# Patient Record
Sex: Female | Born: 1965 | Race: Black or African American | Hispanic: No | Marital: Single | Smoking: Never smoker
Health system: Southern US, Community
[De-identification: ages and names within clinical notes are randomized; demographics above are authoritative.]

## PROBLEM LIST (undated history)

## (undated) DIAGNOSIS — I2699 Other pulmonary embolism without acute cor pulmonale: Secondary | ICD-10-CM

## (undated) DIAGNOSIS — I10 Essential (primary) hypertension: Secondary | ICD-10-CM

## (undated) DIAGNOSIS — E119 Type 2 diabetes mellitus without complications: Secondary | ICD-10-CM

## (undated) DIAGNOSIS — J45909 Unspecified asthma, uncomplicated: Secondary | ICD-10-CM

## (undated) DIAGNOSIS — G4730 Sleep apnea, unspecified: Secondary | ICD-10-CM

## (undated) DIAGNOSIS — K219 Gastro-esophageal reflux disease without esophagitis: Secondary | ICD-10-CM

## (undated) HISTORY — PX: OTHER SURGICAL HISTORY: SHX169

## (undated) HISTORY — PX: TUBAL LIGATION: SHX77

## (undated) HISTORY — PX: LITHOTRIPSY: SUR834

## (undated) HISTORY — PX: ANKLE SURGERY: SHX546

---

## 1997-09-25 ENCOUNTER — Emergency Department (HOSPITAL_COMMUNITY): Admission: EM | Admit: 1997-09-25 | Discharge: 1997-09-25 | Payer: Self-pay | Admitting: Emergency Medicine

## 1998-01-20 ENCOUNTER — Emergency Department (HOSPITAL_COMMUNITY): Admission: EM | Admit: 1998-01-20 | Discharge: 1998-01-20 | Payer: Self-pay | Admitting: Emergency Medicine

## 1998-01-21 ENCOUNTER — Encounter: Payer: Self-pay | Admitting: Emergency Medicine

## 1998-01-21 ENCOUNTER — Emergency Department (HOSPITAL_COMMUNITY): Admission: EM | Admit: 1998-01-21 | Discharge: 1998-01-21 | Payer: Self-pay | Admitting: Emergency Medicine

## 1998-01-25 ENCOUNTER — Encounter: Payer: Self-pay | Admitting: Emergency Medicine

## 1998-01-25 ENCOUNTER — Emergency Department (HOSPITAL_COMMUNITY): Admission: EM | Admit: 1998-01-25 | Discharge: 1998-01-25 | Payer: Self-pay | Admitting: Emergency Medicine

## 1998-04-05 ENCOUNTER — Emergency Department (HOSPITAL_COMMUNITY): Admission: EM | Admit: 1998-04-05 | Discharge: 1998-04-05 | Payer: Self-pay | Admitting: Emergency Medicine

## 1998-05-10 ENCOUNTER — Emergency Department (HOSPITAL_COMMUNITY): Admission: EM | Admit: 1998-05-10 | Discharge: 1998-05-10 | Payer: Self-pay | Admitting: Emergency Medicine

## 1998-06-05 ENCOUNTER — Emergency Department (HOSPITAL_COMMUNITY): Admission: EM | Admit: 1998-06-05 | Discharge: 1998-06-05 | Payer: Self-pay | Admitting: Emergency Medicine

## 1998-06-05 ENCOUNTER — Encounter: Payer: Self-pay | Admitting: Emergency Medicine

## 1998-07-02 ENCOUNTER — Ambulatory Visit (HOSPITAL_COMMUNITY): Admission: RE | Admit: 1998-07-02 | Discharge: 1998-07-02 | Payer: Self-pay | Admitting: Family Medicine

## 1998-07-02 ENCOUNTER — Encounter: Payer: Self-pay | Admitting: Family Medicine

## 1998-08-03 ENCOUNTER — Encounter: Payer: Self-pay | Admitting: Emergency Medicine

## 1998-08-03 ENCOUNTER — Emergency Department (HOSPITAL_COMMUNITY): Admission: EM | Admit: 1998-08-03 | Discharge: 1998-08-03 | Payer: Self-pay | Admitting: Emergency Medicine

## 1998-08-07 ENCOUNTER — Encounter: Payer: Self-pay | Admitting: Emergency Medicine

## 1998-08-07 ENCOUNTER — Ambulatory Visit (HOSPITAL_COMMUNITY): Admission: RE | Admit: 1998-08-07 | Discharge: 1998-08-07 | Payer: Self-pay | Admitting: Emergency Medicine

## 1999-04-29 ENCOUNTER — Emergency Department (HOSPITAL_COMMUNITY): Admission: EM | Admit: 1999-04-29 | Discharge: 1999-04-29 | Payer: Self-pay | Admitting: *Deleted

## 1999-04-30 ENCOUNTER — Encounter: Payer: Self-pay | Admitting: *Deleted

## 2000-05-11 ENCOUNTER — Ambulatory Visit (HOSPITAL_COMMUNITY): Admission: RE | Admit: 2000-05-11 | Discharge: 2000-05-11 | Payer: Self-pay | Admitting: Family Medicine

## 2000-05-11 ENCOUNTER — Encounter: Payer: Self-pay | Admitting: Family Medicine

## 2000-06-11 ENCOUNTER — Other Ambulatory Visit: Admission: RE | Admit: 2000-06-11 | Discharge: 2000-06-11 | Payer: Self-pay | Admitting: Family Medicine

## 2000-06-17 ENCOUNTER — Ambulatory Visit (HOSPITAL_COMMUNITY): Admission: RE | Admit: 2000-06-17 | Discharge: 2000-06-17 | Payer: Self-pay | Admitting: Family Medicine

## 2000-08-18 ENCOUNTER — Emergency Department (HOSPITAL_COMMUNITY): Admission: EM | Admit: 2000-08-18 | Discharge: 2000-08-19 | Payer: Self-pay | Admitting: Emergency Medicine

## 2000-08-19 ENCOUNTER — Encounter: Payer: Self-pay | Admitting: Internal Medicine

## 2000-12-18 ENCOUNTER — Emergency Department (HOSPITAL_COMMUNITY): Admission: EM | Admit: 2000-12-18 | Discharge: 2000-12-18 | Payer: Self-pay | Admitting: Emergency Medicine

## 2000-12-22 ENCOUNTER — Ambulatory Visit (HOSPITAL_COMMUNITY): Admission: RE | Admit: 2000-12-22 | Discharge: 2000-12-22 | Payer: Self-pay | Admitting: Family Medicine

## 2000-12-22 ENCOUNTER — Encounter: Payer: Self-pay | Admitting: Family Medicine

## 2001-01-10 ENCOUNTER — Other Ambulatory Visit: Admission: RE | Admit: 2001-01-10 | Discharge: 2001-01-10 | Payer: Self-pay | Admitting: Obstetrics and Gynecology

## 2001-02-09 ENCOUNTER — Encounter (INDEPENDENT_AMBULATORY_CARE_PROVIDER_SITE_OTHER): Payer: Self-pay | Admitting: Specialist

## 2001-02-09 ENCOUNTER — Ambulatory Visit (HOSPITAL_COMMUNITY): Admission: RE | Admit: 2001-02-09 | Discharge: 2001-02-09 | Payer: Self-pay | Admitting: Obstetrics and Gynecology

## 2001-10-12 ENCOUNTER — Emergency Department (HOSPITAL_COMMUNITY): Admission: EM | Admit: 2001-10-12 | Discharge: 2001-10-12 | Payer: Self-pay | Admitting: Emergency Medicine

## 2002-12-18 ENCOUNTER — Other Ambulatory Visit: Admission: RE | Admit: 2002-12-18 | Discharge: 2002-12-18 | Payer: Self-pay | Admitting: Obstetrics and Gynecology

## 2002-12-26 ENCOUNTER — Encounter (INDEPENDENT_AMBULATORY_CARE_PROVIDER_SITE_OTHER): Payer: Self-pay | Admitting: *Deleted

## 2002-12-26 ENCOUNTER — Ambulatory Visit (HOSPITAL_COMMUNITY): Admission: RE | Admit: 2002-12-26 | Discharge: 2002-12-26 | Payer: Self-pay | Admitting: Obstetrics and Gynecology

## 2003-03-19 ENCOUNTER — Emergency Department (HOSPITAL_COMMUNITY): Admission: EM | Admit: 2003-03-19 | Discharge: 2003-03-19 | Payer: Self-pay | Admitting: Emergency Medicine

## 2004-07-09 ENCOUNTER — Encounter: Admission: RE | Admit: 2004-07-09 | Discharge: 2004-07-09 | Payer: Self-pay | Admitting: Family Medicine

## 2004-11-03 ENCOUNTER — Emergency Department (HOSPITAL_COMMUNITY): Admission: EM | Admit: 2004-11-03 | Discharge: 2004-11-03 | Payer: Self-pay | Admitting: Emergency Medicine

## 2006-03-17 ENCOUNTER — Ambulatory Visit (HOSPITAL_COMMUNITY): Admission: RE | Admit: 2006-03-17 | Discharge: 2006-03-17 | Payer: Self-pay | Admitting: Family Medicine

## 2006-07-08 ENCOUNTER — Emergency Department (HOSPITAL_COMMUNITY): Admission: EM | Admit: 2006-07-08 | Discharge: 2006-07-08 | Payer: Self-pay | Admitting: Family Medicine

## 2006-07-17 ENCOUNTER — Emergency Department (HOSPITAL_COMMUNITY): Admission: EM | Admit: 2006-07-17 | Discharge: 2006-07-17 | Payer: Self-pay | Admitting: Emergency Medicine

## 2007-03-31 ENCOUNTER — Emergency Department (HOSPITAL_COMMUNITY): Admission: EM | Admit: 2007-03-31 | Discharge: 2007-03-31 | Payer: Self-pay | Admitting: Emergency Medicine

## 2007-04-22 ENCOUNTER — Ambulatory Visit (HOSPITAL_COMMUNITY): Admission: RE | Admit: 2007-04-22 | Discharge: 2007-04-22 | Payer: Self-pay | Admitting: Family Medicine

## 2007-12-29 ENCOUNTER — Other Ambulatory Visit: Admission: RE | Admit: 2007-12-29 | Discharge: 2007-12-29 | Payer: Self-pay | Admitting: Family Medicine

## 2008-02-17 ENCOUNTER — Encounter: Admission: RE | Admit: 2008-02-17 | Discharge: 2008-02-17 | Payer: Self-pay | Admitting: Family Medicine

## 2008-02-28 ENCOUNTER — Encounter: Admission: RE | Admit: 2008-02-28 | Discharge: 2008-02-28 | Payer: Self-pay | Admitting: Family Medicine

## 2008-04-08 ENCOUNTER — Emergency Department (HOSPITAL_COMMUNITY): Admission: EM | Admit: 2008-04-08 | Discharge: 2008-04-08 | Payer: Self-pay | Admitting: Emergency Medicine

## 2008-07-12 ENCOUNTER — Ambulatory Visit (HOSPITAL_COMMUNITY): Admission: RE | Admit: 2008-07-12 | Discharge: 2008-07-12 | Payer: Self-pay | Admitting: Family Medicine

## 2009-05-13 ENCOUNTER — Encounter: Admission: RE | Admit: 2009-05-13 | Discharge: 2009-05-13 | Payer: Self-pay | Admitting: Family Medicine

## 2009-09-17 ENCOUNTER — Encounter: Admission: RE | Admit: 2009-09-17 | Discharge: 2009-09-17 | Payer: Self-pay | Admitting: Family Medicine

## 2010-02-07 ENCOUNTER — Encounter: Admission: RE | Admit: 2010-02-07 | Discharge: 2010-02-07 | Payer: Self-pay | Admitting: Family Medicine

## 2010-06-22 ENCOUNTER — Encounter: Payer: Self-pay | Admitting: Family Medicine

## 2010-10-17 NOTE — H&P (Signed)
[facility]  Patient:    Patricia Ramsey, Patricia Ramsey Visit Number: [id number] MRN: [id number]          Service Type: DSU Location: [facility] Attending Physician:  Wandalee Ferdinand Dictated by:   Rudy Jew Ashley Royalty, M.D. Admit Date:  02/09/2001                           History and Physical  HISTORY OF PRESENT ILLNESS:   This is a 45 year old gravida 5, para 2-0-3-2 who presented to me January 14, 2001 for second opinion regarding a cyst on her left ovary.  She went to a physician at [facility] complaining of pain and ultimately had an ultrasound in mid July which revealed an apparent complex cyst of the left ovary.  A note alluding to the problem was brought by the patient; however, no dimensions were given on the report.  She was subsequently referred to another gynecologist in town, the assessment of which she was questioning and she self-referred to me.  She subsequently had an ultrasound in this office, which revealed a fibroid uterus and a 1.3 cm right ovarian cyst and a 1.1 cm left ovarian cyst.  She complained of pain when the left adnexal region was probed.  Hence, she is for diagnostic/operative laparoscopy.  MEDICATIONS:                  None.  PAST MEDICAL HISTORY:         The patient had bilateral tubal sterilization procedure in 1986.  Kidney stones, left ankle surgery.  ALLERGIES:                    None.  FAMILY HISTORY:               Positive for hypertension.  SOCIAL HISTORY:               The patient denies the use of tobacco or significant alcohol.  REVIEW OF SYSTEMS:            Noncontributory.  PHYSICAL EXAMINATION:  GENERAL:                      A well-developed, well-nourished, pleasant black female in no acute distress.  VITAL SIGNS:                  Afebrile.  Vital signs stable.  SKIN:                         Warm and dry without lesions.  LYMPHATIC:                    There is no supraclavicular, cervical, or inguinal  adenopathy.  HEENT:                        Normocephalic.  NECK:                         Supple without thyromegaly.  CHEST:                        Lungs are clear.  CARDIAC:                      Regular rate and rhythm without murmurs, gallops, or rubs.  BREASTS:  Deferred.  ABDOMEN:                      Soft and nontender without masses or organomegaly.  Bowel sounds were active.  MUSCULOSKELETAL:              Full range of motion without edema, cyanosis, or CVA tenderness.  PELVIC:                       Deferred until examination under anesthesia.  IMPRESSION:                   1. Right ovarian cyst versus cystic follicle.                               2. Left ovarian cystic follicle.                               3. Fibroid uterus (small).                               4. Left lower quadrant discomfort--etiology                                  uncertain.  Differential includes                                  endometriosis, adhesions.  PLAN:                         Diagnostic/operative laparoscopy.  The risks, benefits, complications, and alternatives fully discussed with the patient. Questions invited and answered. Dictated by:   Rudy Jew Ashley Royalty, M.D. Attending Physician:  Wandalee Ferdinand DD:  02/10/01 TD:  02/10/01 Job: [id number]

## 2010-10-17 NOTE — Op Note (Signed)
NAME:  Patricia Ramsey, Patricia Ramsey                          ACCOUNT NO.:  [id number]   MEDICAL RECORD NO.:  [id number]                   PATIENT TYPE:  AMB   LOCATION:  SDC                                  FACILITY:  [facility]   PHYSICIAN:  James A. Ashley Royalty, M.D.             DATE OF BIRTH:  07/30/1965   DATE OF PROCEDURE:  12/26/2002  DATE OF DISCHARGE:                                 OPERATIVE REPORT   PREOPERATIVE DIAGNOSES:  1. Right adnexal cyst with internal septations.  2. Endometriosis.  3. Pelvic pain - left greater than right, debilitating.   POSTOPERATIVE DIAGNOSES:  1. Right adnexal cyst with internal septations, pathology pending.  2. Adhesions from the rectosigmoid colon to the posterior cul-de-sac.   PROCEDURES:  1. Diagnostic/operative laparotomy.  2. Right oophorectomy.  3. Lysis of adhesions.   SURGEON:  Rudy Jew. Ashley Royalty, M.D.   ANESTHESIA:  General endotracheal anesthesia.   ESTIMATED BLOOD LOSS:  Less than 50 mL.   COMPLICATIONS:  None.   PACKS AND DRAINS:  None.   DESCRIPTION OF PROCEDURE:  The patient was taken to the operating room and  placed in the dorsal supine position.  After adequate general endotracheal  anesthesia was administered, she was placed in the lithotomy position and  prepped and draped in the usual manner for abdominal vaginal surgery.  A  posterior weighted retractor was placed in the vagina.  The anterior lip of  the cervix was grasped with a single-tooth tenaculum.  _______ uterine  manipulator was placed per cervix and held in place with the tenaculum.  The  bladder was drained with a red rubber catheter.  Next, a 1.5 cm  infraumbilical incision was made in the transverse plane.  The patient's old  scar was excised and submitted to pathology due to the patient's history of  endometriosis.  Next, a size 10-11 disposable trocar was placed into the  abdominal cavity without difficulty.  Its location was verified by placement  of the  laparoscope.  There was no evidence of any trauma.  Pneumoperitoneum  was created with CO2 and maintained throughout the procedure.  Next, two 5  mm trocars were placed suprapubically in the left and right lower quadrants,  respectively.  Transillumination and direct visualization techniques were  employed.  The pelvis was then thoroughly inspected.  The uterine corpus was  approximately 6 x 5 x 4 cm and slightly nodular due to fibroids.  The left  fallopian tube was normal save for evidence of previous tubal sterilization  procedure.  The left ovary was normal size, shape and contour without  evidence of any cysts or current endometriosis.  The right fallopian tube  appeared to be surgically absent.  The right ovary was enlarged with an  obvious cyst of perhaps 3 cm in diameter.  The posterior aspect of the ovary  was adherent to the right pelvic sidewall.  The remainder of  the peritoneal  surfaces were smooth and glistening.  There were definite adhesions on the  rectosigmoid colon to the posterior cul-de-sac.  I could appreciate no other  evidence of current endometriosis save for the adherence of the right ovary  to the right pelvic sidewall.   At this point, the ureter was identified and attention turned to removal of  the ovarian cyst.  Before pelvic washings could be obtained, the cyst  ruptured with gentle manipulation and serous fluid ran out.  Due to the fact  that the patient had internal septations, the decision was made to go ahead  and remove the ovary.  Using a tripolar cautery, the infundibulopelvic  ligament was coagulated and divided.  The meso-ovarian was coagulated and  divided, thus releasing the ovary.  Careful attention was paid to the course  of the ureter and to avoid any such injury.  The right ovary was placed in  the anterior cul-de-sac for later retrieval.  The adhesions from the  rectosigmoid colon were sharply and bluntly lysed, thus restoring mobility  to  that area.  Copious irrigation was accomplished. Hemostasis was noted.  The left suprapubic incision was extended to accommodate a size 10 trocar.  The EndoCatch apparatus was introduced into the abdominal cavity and the  specimen placed in the bag.  With only minimal extension of the 10 mm  incision, the right ovary was delivered through the abdominal wall and  submitted to pathology for histologic studies.   At this point, the patient was felt to have benefitted maximally from the  surgical procedure.  The abdominal instruments were removed and the  pneumoperitoneum evacuated.  The vaginal defects were closed with 0 Vicryl  in interrupted fashion with respect to superior and right inferior port.  With respect to the left inferior port, the fascia was closed in a running  fashion.  Hemostasis was noted.  The skin was then closed with 3-0 Monocryl  in a subcuticular fashion.  Vaginal instruments were removed.  Hemostasis  was noted.  Approximately 10 mL of 0.25% bupivacaine were instilled into the  incisions to aid in postoperative analgesia.  The patient tolerated the  procedure extremely well and was returned to the recovery room in good  condition.                                               James A. Ashley Royalty, M.D.    JAM/MEDQ  D:  12/26/2002  T:  12/26/2002  Job:  [id number]

## 2010-10-17 NOTE — Op Note (Signed)
[facility]  Patient:    Patricia Ramsey, Patricia Ramsey Visit Number: [id number] MRN: [id number]          Service Type: Attending:  Rudy Jew. Ashley Royalty, M.D. Dictated by:   Rudy Jew Ashley Royalty, M.D. Proc. Date: 02/09/01                             Operative Report  PREOPERATIVE DIAGNOSES:       1. Fibroid uterus.                               2. Right ovarian cyst versus cystic follicle.                               3. Left ovarian cystic follicle.                               4. Left lower quadrant discomfort - etiology                                  uncertain.  The differential includes                                  endometriosis versus adhesions.  POSTOPERATIVE DIAGNOSES:      1. Right ovarian cyst versus cystic follicle.                               2. Adhesions of the posterior right ovary to the                                  right pelvic side wall.                               3. Pigmented lesion of the anterior cul-de-sac.                                  Pathology pending.  PROCEDURES:                   1. Diagnostic/operative laparoscopy.                               2. Lysis of adhesions.                               3. Biopsy of the anterior cul-de-sac.  SURGEON:                      Rudy Jew. Ashley Royalty, M.D.  ANESTHESIA:                   General.  ESTIMATED BLOOD LOSS:         Less than 25 cc.  COMPLICATIONS:  None.  PACKS AND DRAINS:             None.  DESCRIPTION OF PROCEDURE:     The patient was taken to the operating room and placed in the dorsal supine position.  After adequate general anesthesia was administered, she was placed in the lithotomy position, prepped and draped in the usual manner for abdominal and vaginal surgery.  A posterior weighted retractor was placed per vagina.  The anterior lip of the cervix was grasped with a single-tooth tenaculum.  A Jarcho uterine manipulator was placed per cervix.  The  bladder was drained with a red rubber catheter.  Next, a 2.5 cm infraumbilical incision was made in the transverse plane.  A size 10-11 disposable lower extremity laparoscopic trocar was placed in the abdominal cavity.  Its location was verified by placement of the laparoscope.  There was no evidence of any trauma.  Pneumoperitoneum was created with CO2 and maintained throughout the procedure.  Two additional 5 mm suprapubic ports were made in the left and right lower quadrants respectively using trocars. Direct visualization and transillumination techniques were used.  The pelvis was thoroughly surveyed.  The uterus was normal size, shape and contour without evidence of any subserosal fibroids.  The left and right fallopian tubes were normal size, shape, contour and length with luxuriant fimbria.  The left ovary was essentially normal size, mobile without evidence of any obvious cysts or endometriosis.  The right ovary appeared to contain a small cyst of perhaps 1.5 cm in diameter.  Furthermore, its posterior aspect was adherent to the lateral pelvic side wall.  There was no other evidence of endometriosis on the ovary.  There was a 3-4 mm pigmented lesion in the anterior cul-de-sac consistent with but not diagnostic of endometriosis.  The remainder of the peritoneal surfaces were smooth and glistening without any evidence of endometriosis or adhesions.  An attempt was made to free the right ovary from its adhesive attachment to the right pelvic side wall.  In so doing, the small cyst ruptured. The adhesion to the right pelvic side wall were successfully lysed.  Copious irrigation was accomplished with the Nezhat suction irrigator.  Hemostasis was obtained with the bipolar cautery.  Next, the pigmented lesion of the anterior cul-de-sac was biopsied and the small piece of tissue submitted to pathology for histologic studies. Hemostasis was present.  At this point, the patient was felt  to have benefited maximally from the surgical procedure.  Hence, the abdominal instruments were then removed. Pneumoperitoneum was evacuated.  The fascial defects were closed with 0 Vicryl in an interrupted fashion.  The skin was closed with 3-0 chromic in a subcuticular fashion.  The patient tolerated the procedure extremely well and was returned to the recovery room in good condition. was accomplished with the Dictated by:   Rudy Jew. Ashley Royalty, M.D. Attending:  Rudy Jew Ashley Royalty, M.D. DD:  02/10/01 TD:  02/10/01 Job: [id number]

## 2010-10-17 NOTE — H&P (Signed)
NAME:  Patricia Ramsey, Patricia Ramsey                          ACCOUNT NO.:  [id number]   MEDICAL RECORD NO.:  [id number]                   PATIENT TYPE:  AMB   LOCATION:  [facility]C                                  FACILITY:  [facility]   PHYSICIAN:  James A. Ashley Royalty, M.D.             DATE OF BIRTH:  1965-12-29   DATE OF ADMISSION:  12/26/2002  DATE OF DISCHARGE:                                HISTORY & PHYSICAL   HISTORY OF PRESENT ILLNESS:  This is a 45 year old gravida 5, para 2, 0, 3,  2, who presented to me for her annual examination on December 18, 2002.  At that  time she complained of left lower quadrant discomfort which she describes as  quite debilitating.  She states that it seems somewhat reminiscent of the  pain that she had at the time of her September 2001, laparoscopy at which  point endometriosis was diagnosed.  It radiates slightly to the right as  well.  She denies any associated symptoms.  An ultrasound was performed in  the office on December 21, 2002.  The ultrasound revealed numerous small  fibroids on the uterus.  The overall uterine dimensions were 11 cm x 6 cm x  5 cm.  The left adnexa was unremarkable.  In the right adnexa there was a  2.6 cm complex mass with septations.  The patient was encouraged to wait six  to eight weeks, to see if the adnexal cyst resolved; however, she returned,  stating that she did not feel that she could wait that period of time and  that she was already missing work due to her pelvic symptoms and she has  thus requested surgery on a more emergent basis.   MEDICATIONS:  None.   PAST MEDICAL HISTORY:  Nephrolithiasis in 1997, through 1999.   PAST SURGICAL HISTORY:  1. In 1992, an ectopic pregnancy.  2. In 1986, a tubal sterilization procedure.  3. In 1996, bilateral foot surgery.   ALLERGIES:  No known drug allergies.   FAMILY HISTORY:  Positive for hypertension.   SOCIAL HISTORY:  The patient denies the use of tobacco or significant  alcohol.   REVIEW  OF SYSTEMS:  Noncontributory.   PHYSICAL EXAMINATION:  GENERAL:  A well-nourished, well-developed, pleasant  black female, in no acute distress.  VITAL SIGNS:  Afebrile, vital signs stable.  SKIN:  Warm and dry without lesions.  NODES:  No supraclavicular, cervical, or inguinal adenopathy.  HEENT:  Normocephalic.  NECK:  Supple without thyromegaly.  CHEST:  Lungs are clear.  HEART:  A regular rate and rhythm without murmurs, gallops, or rubs.  RECTAL:  Exam deferred.  ABDOMEN:  Soft, nontender, without masses or organomegaly.  Bowel sounds are  active.  MUSCULOSKELETAL:  Examination reveals a full range of motion without  cyanosis, or edema, no CVA tenderness.  PELVIC:  (On December 18, 2002), the external genitalia were within normal  limits.  Vagina and cervix are without gross lesions.  Uterus is  approximately 10 cm x 5 cm x 4 cm.  No adnexal masses are palpable.   IMPRESSION:  1. History of endometriosis.  2. Status post bilateral tubal sterilization procedure.  3. History of nephrolithiasis.  4. Fibroid uterus.  5. Right adnexal cyst.  6. Left lower quadrant discomfort - etiology uncertain.  Differential     includes endometriosis, ovarian cyst, adhesions, musculoskeletal, primary     gastrointestinal, etc.   PLAN:  Diagnostic/operative laparoscopy.  The risks, benefits,  complications, and alternatives were fully discussed with the patient.  The  possibility of unilateral salpingo-oophorectomy is discussed and accepted.  The possibility of an exploratory laparotomy is discussed and accepted.  Questions are invited and answered.                                                   James A. Ashley Royalty, M.D.    JAM/MEDQ  D:  12/25/2002  T:  12/25/2002  Job:  [id number]

## 2011-03-01 ENCOUNTER — Inpatient Hospital Stay (INDEPENDENT_AMBULATORY_CARE_PROVIDER_SITE_OTHER)
Admission: RE | Admit: 2011-03-01 | Discharge: 2011-03-01 | Disposition: A | Discharge status: Home / Self Care | Payer: Medicaid Other | Source: Ambulatory Visit | Attending: Emergency Medicine | Admitting: Emergency Medicine

## 2011-03-01 DIAGNOSIS — M7710 Lateral epicondylitis, unspecified elbow: Secondary | ICD-10-CM

## 2011-03-11 LAB — I-STAT 8, (EC8 V) (CONVERTED LAB)
Acid-base deficit: 2
BUN: 10
Bicarbonate: 23.2
Chloride: 107
Glucose, Bld: 86
HCT: 42
Hemoglobin: 14.3
Operator id: 275321
Potassium: 4
Sodium: 138
TCO2: 24
pCO2, Ven: 42.1 — ABNORMAL LOW
pH, Ven: 7.349 — ABNORMAL HIGH

## 2011-03-11 LAB — CBC
MCHC: 33.6
MCV: 88.1
Platelets: 281
RDW: 14.2 — ABNORMAL HIGH
WBC: 8.8

## 2011-03-11 LAB — POCT PREGNANCY, URINE
Operator id: 27532
Preg Test, Ur: NEGATIVE

## 2011-03-11 LAB — POCT I-STAT CREATININE
Creatinine, Ser: 0.8
Operator id: 275321

## 2011-03-11 LAB — URINALYSIS, ROUTINE W REFLEX MICROSCOPIC
Hgb urine dipstick: NEGATIVE
Specific Gravity, Urine: 1.03
pH: 6

## 2011-03-11 LAB — DIFFERENTIAL
Eosinophils Relative: 4
Lymphocytes Relative: 37
Monocytes Absolute: 0.6
Monocytes Relative: 7
Neutrophils Relative %: 53

## 2012-06-05 ENCOUNTER — Emergency Department (HOSPITAL_COMMUNITY): Payer: Medicaid Other

## 2012-06-05 ENCOUNTER — Emergency Department (HOSPITAL_COMMUNITY)
Admission: EM | Admit: 2012-06-05 | Discharge: 2012-06-05 | Disposition: A | Discharge status: Home Health | Payer: Medicaid Other | Attending: Emergency Medicine | Admitting: Emergency Medicine

## 2012-06-05 ENCOUNTER — Encounter (HOSPITAL_COMMUNITY): Payer: Self-pay | Admitting: *Deleted

## 2012-06-05 DIAGNOSIS — R05 Cough: Secondary | ICD-10-CM

## 2012-06-05 DIAGNOSIS — E119 Type 2 diabetes mellitus without complications: Secondary | ICD-10-CM | POA: Insufficient documentation

## 2012-06-05 DIAGNOSIS — I10 Essential (primary) hypertension: Secondary | ICD-10-CM | POA: Insufficient documentation

## 2012-06-05 DIAGNOSIS — R059 Cough, unspecified: Secondary | ICD-10-CM | POA: Insufficient documentation

## 2012-06-05 DIAGNOSIS — Z79899 Other long term (current) drug therapy: Secondary | ICD-10-CM | POA: Insufficient documentation

## 2012-06-05 DIAGNOSIS — J45901 Unspecified asthma with (acute) exacerbation: Secondary | ICD-10-CM | POA: Insufficient documentation

## 2012-06-05 HISTORY — DX: Unspecified asthma, uncomplicated: J45.909

## 2012-06-05 HISTORY — DX: Type 2 diabetes mellitus without complications: E11.9

## 2012-06-05 HISTORY — DX: Essential (primary) hypertension: I10

## 2012-06-05 MED ORDER — ALBUTEROL SULFATE (2.5 MG/3ML) 0.083% IN NEBU: 2.5000 mg | INHALATION_SOLUTION | Freq: Four times a day (QID) | RESPIRATORY_TRACT | Status: DC | PRN

## 2012-06-05 MED ORDER — ALBUTEROL SULFATE (5 MG/ML) 0.5% IN NEBU
5.0000 mg | INHALATION_SOLUTION | RESPIRATORY_TRACT | Status: AC
  Administered 2012-06-05 (×3): 5 mg via RESPIRATORY_TRACT
  Filled 2012-06-05 (×3): qty 40

## 2012-06-05 MED ORDER — PREDNISONE 20 MG PO TABS
60.0000 mg | ORAL_TABLET | Freq: Once | ORAL | Status: AC
  Administered 2012-06-05: 60 mg via ORAL
  Filled 2012-06-05: qty 3

## 2012-06-05 MED ORDER — PREDNISONE 20 MG PO TABS: 40.0000 mg | ORAL_TABLET | Freq: Every day | ORAL | Status: DC

## 2012-06-05 MED ORDER — ALBUTEROL SULFATE HFA 108 (90 BASE) MCG/ACT IN AERS: 2.0000 | INHALATION_SPRAY | RESPIRATORY_TRACT | Status: DC | PRN

## 2012-06-05 MED ORDER — IPRATROPIUM BROMIDE 0.02 % IN SOLN
0.5000 mg | RESPIRATORY_TRACT | Status: AC
  Administered 2012-06-05 (×3): 0.5 mg via RESPIRATORY_TRACT
  Filled 2012-06-05 (×3): qty 2.5

## 2012-06-05 NOTE — ED Notes (Signed)
Pt discharged to home with family. NAD.  

## 2012-06-05 NOTE — ED Notes (Signed)
Pt says that she has had a cough and increasing shortness of breath over the past several days

## 2012-06-05 NOTE — ED Provider Notes (Signed)
History     CSN: [id number]  Arrival date & time 06/05/12  1191   First MD Initiated Contact with Patient 06/05/12 0534      Chief Complaint  Patient presents with  . Shortness of Breath    (Consider location/radiation/quality/duration/timing/severity/associated sxs/prior treatment) The history is provided by the patient.  Patricia Ramsey is a 47 y.o. female history of asthma, HTN here with SOB. Shortness or breath and cough over the last 4 days. She has been using her albuterol about twice a day. This evening she was unable to sleep from the shortness of breath. No history of DVT or PE. No leg swelling or recent travel. She rarely has asthma exacerbation and was never admitted.   Past Medical History  Diagnosis Date  . Asthma   . Diabetes   . Hypertension     Past Surgical History  Procedure Date  . Tubal ligation   . Lithotripsy   . Oophrectomy   . Ankle surgery     No family history on file.  History  Substance Use Topics  . Smoking status: Not on file  . Smokeless tobacco: Not on file  . Alcohol Use: No    OB History    Grav Para Term Preterm Abortions TAB SAB Ect Mult Living                  Review of Systems  Respiratory: Positive for shortness of breath.   All other systems reviewed and are negative.    Allergies  Review of patient's allergies indicates no known allergies.  Home Medications   Current Outpatient Rx  Name  Route  Sig  Dispense  Refill  . ALBUTEROL SULFATE HFA 108 (90 BASE) MCG/ACT IN AERS   Inhalation   Inhale 2 puffs into the lungs every 6 (six) hours as needed. For shortness of breath         . ALBUTEROL SULFATE (2.5 MG/3ML) 0.083% IN NEBU   Nebulization   Take 2.5 mg by nebulization every 6 (six) hours as needed. For shortness of breath         . AMLODIPINE-OLMESARTAN 5-40 MG PO TABS   Oral   Take 1 tablet by mouth daily.         Marland Kitchen HYDROCHLOROTHIAZIDE 25 MG PO TABS   Oral   Take 25 mg by mouth daily.           Marland Kitchen METFORMIN HCL 500 MG PO TABS   Oral   Take 500 mg by mouth daily with breakfast.         . ALBUTEROL SULFATE HFA 108 (90 BASE) MCG/ACT IN AERS   Inhalation   Inhale 2 puffs into the lungs every 4 (four) hours as needed for wheezing.   1 Inhaler   0   . PREDNISONE 20 MG PO TABS   Oral   Take 2 tablets (40 mg total) by mouth daily.   10 tablet   0     BP 120/93  Pulse 115  Temp 98.8 F (37.1 C) (Oral)  Resp 22  SpO2 98%  Physical Exam  Nursing note and vitals reviewed. Constitutional: She is oriented to person, place, and time. She appears well-developed and well-nourished.       Coughing, talking in full sentences   HENT:  Head: Normocephalic.  Mouth/Throat: Oropharynx is clear and moist.  Eyes: Conjunctivae normal are normal. Pupils are equal, round, and reactive to light.  Neck: Normal range of motion. Neck  supple.  Cardiovascular: Regular rhythm and normal heart sounds.        Tachycardic   Pulmonary/Chest:       Talking in full sentences. + coughing. + wheezing when coughing. Good air movement. No crackles.   Abdominal: Soft. Bowel sounds are normal. She exhibits no distension. There is no tenderness. There is no rebound.  Musculoskeletal: Normal range of motion. She exhibits no edema and no tenderness.  Neurological: She is alert and oriented to person, place, and time.  Skin: Skin is warm and dry.  Psychiatric: She has a normal mood and affect. Her behavior is normal. Thought content normal.    ED Course  Procedures (including critical care time)   Labs Reviewed  GLUCOSE, CAPILLARY   Dg Chest 2 View  06/05/2012  *RADIOLOGY REPORT*  Clinical Data: Right chest pain, shortness of breath, cough for 3 days, history asthma, bronchitis, diabetes, hypertension, pneumonia  CHEST - 2 VIEW  Comparison: None  Findings: Normal heart size, mediastinal contours, and pulmonary vascularity. Bronchitic changes without infiltrate, pleural effusion, or pneumothorax. Minimal  atelectasis versus scarring right middle lobe. Bones unremarkable.  IMPRESSION: Bronchitic changes with minimal atelectasis versus scarring in right middle lobe.   Original Report Authenticated By: Ulyses Southward, M.D.      1. Asthma exacerbation   2. Cough      Date: 06/05/2012  Rate: 113  Rhythm: sinus tachycardia  QRS Axis: normal  Intervals: normal  ST/T Wave abnormalities: nonspecific ST changes  Conduction Disutrbances:none  Narrative Interpretation:   Old EKG Reviewed: none available    MDM  Patricia Ramsey is a 47 y.o. female hx of asthma here with SOB and cough. Likely has asthma exacerbation from viral infection. Will give prednisone, albuterol/atrovent x 3 and CXR. Will reassess.   7:08 AM Felt better. CXR showed bronchitic changes. Less wheezing and improved air movement on exam. Will d/c home with a course of steroids and albuterol prn.        Richardean Canal, MD 06/05/12 [phone]

## 2012-06-16 ENCOUNTER — Other Ambulatory Visit (HOSPITAL_COMMUNITY)
Admission: RE | Admit: 2012-06-16 | Discharge: 2012-06-16 | Disposition: A | Discharge status: Home / Self Care | Payer: Medicaid Other | Source: Ambulatory Visit | Attending: Family Medicine | Admitting: Family Medicine

## 2012-06-16 ENCOUNTER — Other Ambulatory Visit: Payer: Self-pay | Admitting: Family Medicine

## 2012-06-16 DIAGNOSIS — Z1151 Encounter for screening for human papillomavirus (HPV): Secondary | ICD-10-CM | POA: Insufficient documentation

## 2012-06-16 DIAGNOSIS — Z01419 Encounter for gynecological examination (general) (routine) without abnormal findings: Secondary | ICD-10-CM | POA: Insufficient documentation

## 2012-06-22 ENCOUNTER — Other Ambulatory Visit (HOSPITAL_COMMUNITY): Payer: Self-pay | Admitting: Family Medicine

## 2012-06-22 ENCOUNTER — Other Ambulatory Visit: Payer: Self-pay | Admitting: Family Medicine

## 2012-06-22 DIAGNOSIS — R102 Pelvic and perineal pain: Secondary | ICD-10-CM

## 2012-06-22 DIAGNOSIS — Z1231 Encounter for screening mammogram for malignant neoplasm of breast: Secondary | ICD-10-CM

## 2012-06-27 ENCOUNTER — Ambulatory Visit
Admission: RE | Admit: 2012-06-27 | Discharge: 2012-06-27 | Disposition: A | Discharge status: Home / Self Care | Payer: Medicaid Other | Source: Ambulatory Visit | Attending: Family Medicine | Admitting: Family Medicine

## 2012-06-27 ENCOUNTER — Other Ambulatory Visit: Payer: Medicaid Other

## 2012-06-27 DIAGNOSIS — R102 Pelvic and perineal pain: Secondary | ICD-10-CM

## 2012-07-01 ENCOUNTER — Ambulatory Visit (HOSPITAL_COMMUNITY)
Admission: RE | Admit: 2012-07-01 | Discharge: 2012-07-01 | Disposition: A | Discharge status: Home / Self Care | Payer: Medicaid Other | Source: Ambulatory Visit | Attending: Family Medicine | Admitting: Family Medicine

## 2012-07-01 DIAGNOSIS — Z1231 Encounter for screening mammogram for malignant neoplasm of breast: Secondary | ICD-10-CM | POA: Insufficient documentation

## 2013-03-03 ENCOUNTER — Encounter (HOSPITAL_COMMUNITY): Payer: Self-pay | Admitting: Family Medicine

## 2013-03-03 ENCOUNTER — Emergency Department (HOSPITAL_COMMUNITY): Payer: Medicaid Other

## 2013-03-03 ENCOUNTER — Emergency Department (HOSPITAL_COMMUNITY)
Admission: EM | Admit: 2013-03-03 | Discharge: 2013-03-03 | Disposition: A | Discharge status: Home / Self Care | Payer: Medicaid Other | Attending: Emergency Medicine | Admitting: Emergency Medicine

## 2013-03-03 DIAGNOSIS — Z79899 Other long term (current) drug therapy: Secondary | ICD-10-CM | POA: Insufficient documentation

## 2013-03-03 DIAGNOSIS — I10 Essential (primary) hypertension: Secondary | ICD-10-CM | POA: Insufficient documentation

## 2013-03-03 DIAGNOSIS — Y9229 Other specified public building as the place of occurrence of the external cause: Secondary | ICD-10-CM | POA: Insufficient documentation

## 2013-03-03 DIAGNOSIS — W208XXA Other cause of strike by thrown, projected or falling object, initial encounter: Secondary | ICD-10-CM | POA: Insufficient documentation

## 2013-03-03 DIAGNOSIS — Y939 Activity, unspecified: Secondary | ICD-10-CM | POA: Insufficient documentation

## 2013-03-03 DIAGNOSIS — S6991XA Unspecified injury of right wrist, hand and finger(s), initial encounter: Secondary | ICD-10-CM

## 2013-03-03 DIAGNOSIS — E119 Type 2 diabetes mellitus without complications: Secondary | ICD-10-CM | POA: Insufficient documentation

## 2013-03-03 DIAGNOSIS — J45909 Unspecified asthma, uncomplicated: Secondary | ICD-10-CM | POA: Insufficient documentation

## 2013-03-03 DIAGNOSIS — S6990XA Unspecified injury of unspecified wrist, hand and finger(s), initial encounter: Secondary | ICD-10-CM | POA: Insufficient documentation

## 2013-03-03 NOTE — ED Provider Notes (Signed)
CSN: [id number]     Arrival date & time 03/03/13  1342 History   First MD Initiated Contact with Patient 03/03/13 1349     Chief Complaint  Patient presents with  . Hand Injury   (Consider location/radiation/quality/duration/timing/severity/associated sxs/prior Treatment) Patient is a 47 y.o. female presenting with hand injury. The history is provided by the patient and medical records.  Hand Injury  Patient is to the ED for right hand injury. Patient states he was at [employer] last night and when she tried to get a drink out of the cooler the lack and sodas fell on top of her hand. States now she has pain along the ulnar aspect of her right hand and it feels swollen. Denies any numbness or paresthesias. No prior hand injury. Patient is left-hand dominant. No medications taken for her symptoms PTA.  Past Medical History  Diagnosis Date  . Asthma   . Diabetes   . Hypertension    Past Surgical History  Procedure Laterality Date  . Tubal ligation    . Lithotripsy    . Oophrectomy    . Ankle surgery     History reviewed. No pertinent family history. History  Substance Use Topics  . Smoking status: Never Smoker   . Smokeless tobacco: Not on file  . Alcohol Use: No   OB History   Grav Para Term Preterm Abortions TAB SAB Ect Mult Living                 Review of Systems  Musculoskeletal: Positive for arthralgias.  All other systems reviewed and are negative.    Allergies  Review of patient's allergies indicates no known allergies.  Home Medications   Current Outpatient Rx  Name  Route  Sig  Dispense  Refill  . albuterol (PROVENTIL HFA;VENTOLIN HFA) 108 (90 BASE) MCG/ACT inhaler   Inhalation   Inhale 2 puffs into the lungs every 6 (six) hours as needed. For shortness of breath         . albuterol (PROVENTIL HFA;VENTOLIN HFA) 108 (90 BASE) MCG/ACT inhaler   Inhalation   Inhale 2 puffs into the lungs every 4 (four) hours as needed for wheezing.   1 Inhaler    0   . albuterol (PROVENTIL) (2.5 MG/3ML) 0.083% nebulizer solution   Nebulization   Take 2.5 mg by nebulization every 6 (six) hours as needed. For shortness of breath         . albuterol (PROVENTIL) (2.5 MG/3ML) 0.083% nebulizer solution   Nebulization   Take 3 mLs (2.5 mg total) by nebulization every 6 (six) hours as needed for wheezing.   25 mL   1   . amLODipine-olmesartan (AZOR) 5-40 MG per tablet   Oral   Take 1 tablet by mouth daily.         . hydrochlorothiazide (HYDRODIURIL) 25 MG tablet   Oral   Take 25 mg by mouth daily.         . metFORMIN (GLUCOPHAGE) 500 MG tablet   Oral   Take 500 mg by mouth daily with breakfast.         . predniSONE (DELTASONE) 20 MG tablet   Oral   Take 2 tablets (40 mg total) by mouth daily.   10 tablet   0    BP 138/93  Pulse 84  Temp(Src) 99 F (37.2 C) (Oral)  Resp 16  SpO2 97%  Physical Exam  Nursing note and vitals reviewed. Constitutional: She is oriented to person,  place, and time. She appears well-developed and well-nourished. No distress.  HENT:  Head: Normocephalic and atraumatic.  Mouth/Throat: Oropharynx is clear and moist.  Eyes: Conjunctivae and EOM are normal. Pupils are equal, round, and reactive to light.  Neck: Normal range of motion. Neck supple.  Cardiovascular: Normal rate, regular rhythm and normal heart sounds.   Pulmonary/Chest: Effort normal and breath sounds normal. No respiratory distress. She has no wheezes.  Musculoskeletal: Normal range of motion.       Hands: TTP along right 5th metacarpal; full ROM maintained; normal grip strength; no swelling, bruising, or gross deformity; strong cap refill; sensation intact  Neurological: She is alert and oriented to person, place, and time.  Skin: Skin is warm and dry. She is not diaphoretic.  Psychiatric: She has a normal mood and affect.    ED Course  Procedures (including critical care time) Labs Review Labs Reviewed - No data to  display Imaging Review Dg Hand Complete Right  03/03/2013   CLINICAL DATA:  Fourth and 5th metacarpal pain post right hand injury  EXAM: RIGHT HAND - COMPLETE 3+ VIEW  COMPARISON:  None.  FINDINGS: There is no evidence of fracture or dislocation. There is no evidence of arthropathy or other focal bone abnormality. Soft tissues are unremarkable.  IMPRESSION: Negative.   Electronically Signed   By: Natasha Mead M.D.   On: 03/03/2013 14:53    MDM   1. Hand injury, right, initial encounter    X-ray negative for acute fracture or dislocation. Patient instructed to take over-the-counter anti-inflammatories as needed for pain. She will followup with her primary care physician if problems occur. Discussed plan with patient, she agreed.  Return precautions advised.  Garlon Hatchet, PA-C 03/03/13 1524

## 2013-03-03 NOTE — ED Notes (Signed)
Per pt sts last night she was at [employer] and a rack in the cooler fell on her hand. sts painful and swollen.

## 2013-03-06 NOTE — ED Provider Notes (Signed)
Medical screening examination/treatment/procedure(s) were performed by non-physician practitioner and as supervising physician I was immediately available for consultation/collaboration.    Vida Roller, MD 03/06/13 1501

## 2013-06-05 ENCOUNTER — Other Ambulatory Visit (HOSPITAL_COMMUNITY): Payer: Self-pay | Admitting: Family Medicine

## 2013-06-05 DIAGNOSIS — Z1231 Encounter for screening mammogram for malignant neoplasm of breast: Secondary | ICD-10-CM

## 2013-06-16 ENCOUNTER — Other Ambulatory Visit: Payer: Self-pay | Admitting: Family Medicine

## 2013-06-16 DIAGNOSIS — N83209 Unspecified ovarian cyst, unspecified side: Secondary | ICD-10-CM

## 2013-06-17 ENCOUNTER — Encounter (HOSPITAL_COMMUNITY): Payer: Self-pay | Admitting: Emergency Medicine

## 2013-06-17 ENCOUNTER — Emergency Department (HOSPITAL_COMMUNITY): Payer: Medicaid Other

## 2013-06-17 ENCOUNTER — Emergency Department (HOSPITAL_COMMUNITY)
Admission: EM | Admit: 2013-06-17 | Discharge: 2013-06-18 | Disposition: A | Discharge status: Home / Self Care | Payer: Medicaid Other | Attending: Emergency Medicine | Admitting: Emergency Medicine

## 2013-06-17 DIAGNOSIS — Z9079 Acquired absence of other genital organ(s): Secondary | ICD-10-CM | POA: Insufficient documentation

## 2013-06-17 DIAGNOSIS — Z9889 Other specified postprocedural states: Secondary | ICD-10-CM | POA: Insufficient documentation

## 2013-06-17 DIAGNOSIS — D259 Leiomyoma of uterus, unspecified: Secondary | ICD-10-CM | POA: Insufficient documentation

## 2013-06-17 DIAGNOSIS — Z79899 Other long term (current) drug therapy: Secondary | ICD-10-CM | POA: Insufficient documentation

## 2013-06-17 DIAGNOSIS — I10 Essential (primary) hypertension: Secondary | ICD-10-CM | POA: Insufficient documentation

## 2013-06-17 DIAGNOSIS — Z9851 Tubal ligation status: Secondary | ICD-10-CM | POA: Insufficient documentation

## 2013-06-17 DIAGNOSIS — R109 Unspecified abdominal pain: Secondary | ICD-10-CM

## 2013-06-17 DIAGNOSIS — J45909 Unspecified asthma, uncomplicated: Secondary | ICD-10-CM | POA: Insufficient documentation

## 2013-06-17 DIAGNOSIS — E119 Type 2 diabetes mellitus without complications: Secondary | ICD-10-CM | POA: Insufficient documentation

## 2013-06-17 DIAGNOSIS — R1032 Left lower quadrant pain: Secondary | ICD-10-CM | POA: Insufficient documentation

## 2013-06-17 LAB — BASIC METABOLIC PANEL
BUN: 9 mg/dL (ref 6–23)
CALCIUM: 8.9 mg/dL (ref 8.4–10.5)
CHLORIDE: 102 meq/L (ref 96–112)
CO2: 24 meq/L (ref 19–32)
CREATININE: 0.58 mg/dL (ref 0.50–1.10)
Glucose, Bld: 91 mg/dL (ref 70–99)
POTASSIUM: 4 meq/L (ref 3.7–5.3)
SODIUM: 141 meq/L (ref 137–147)

## 2013-06-17 LAB — CBC
HCT: 41 % (ref 36.0–46.0)
HEMOGLOBIN: 13.8 g/dL (ref 12.0–15.0)
MCH: 29.9 pg (ref 26.0–34.0)
MCHC: 33.7 g/dL (ref 30.0–36.0)
MCV: 88.9 fL (ref 78.0–100.0)
Platelets: 277 10*3/uL (ref 150–400)
RBC: 4.61 MIL/uL (ref 3.87–5.11)
RDW: 14.6 % (ref 11.5–15.5)
WBC: 8.3 10*3/uL (ref 4.0–10.5)

## 2013-06-17 MED ORDER — ALBUTEROL SULFATE (2.5 MG/3ML) 0.083% IN NEBU
5.0000 mg | INHALATION_SOLUTION | Freq: Once | RESPIRATORY_TRACT | Status: AC
  Administered 2013-06-17: 5 mg via RESPIRATORY_TRACT
  Filled 2013-06-17: qty 6

## 2013-06-17 MED ORDER — IPRATROPIUM BROMIDE 0.02 % IN SOLN
0.5000 mg | Freq: Once | RESPIRATORY_TRACT | Status: AC
  Administered 2013-06-17: 0.5 mg via RESPIRATORY_TRACT
  Filled 2013-06-17: qty 2.5

## 2013-06-17 NOTE — ED Notes (Signed)
Patient with audible wheezing and shortness of breath for last two days.  Patient states she has also had increased back pain for the last four days.

## 2013-06-18 ENCOUNTER — Emergency Department (HOSPITAL_COMMUNITY): Payer: Medicaid Other

## 2013-06-18 LAB — URINALYSIS, ROUTINE W REFLEX MICROSCOPIC
BILIRUBIN URINE: NEGATIVE
Glucose, UA: NEGATIVE mg/dL
Hgb urine dipstick: NEGATIVE
KETONES UR: NEGATIVE mg/dL
NITRITE: NEGATIVE
PH: 5.5 (ref 5.0–8.0)
PROTEIN: NEGATIVE mg/dL
Specific Gravity, Urine: 1.026 (ref 1.005–1.030)
UROBILINOGEN UA: 1 mg/dL (ref 0.0–1.0)

## 2013-06-18 LAB — URINE MICROSCOPIC-ADD ON

## 2013-06-18 NOTE — Discharge Instructions (Signed)
Abdominal Pain, Adult °Many things can cause abdominal pain. Usually, abdominal pain is not caused by a disease and will improve without treatment. It can often be observed and treated at home. Your health care provider will do a physical exam and possibly order blood tests and X-rays to help determine the seriousness of your pain. However, in many cases, more time must pass before a clear cause of the pain can be found. Before that point, your health care provider may not know if you need more testing or further treatment. °HOME CARE INSTRUCTIONS  °Monitor your abdominal pain for any changes. The following actions may help to alleviate any discomfort you are experiencing: °· Only take over-the-counter or prescription medicines as directed by your health care provider. °· Do not take laxatives unless directed to do so by your health care provider. °· Try a clear liquid diet (broth, tea, or water) as directed by your health care provider. Slowly move to a bland diet as tolerated. °SEEK MEDICAL CARE IF: °· You have unexplained abdominal pain. °· You have abdominal pain associated with nausea or diarrhea. °· You have pain when you urinate or have a bowel movement. °· You experience abdominal pain that wakes you in the night. °· You have abdominal pain that is worsened or improved by eating food. °· You have abdominal pain that is worsened with eating fatty foods. °SEEK IMMEDIATE MEDICAL CARE IF:  °· Your pain does not go away within 2 hours. °· You have a fever. °· You keep throwing up (vomiting). °· Your pain is felt only in portions of the abdomen, such as the right side or the left lower portion of the abdomen. °· You pass bloody or black tarry stools. °MAKE SURE YOU: °· Understand these instructions.   °· Will watch your condition.   °· Will get help right away if you are not doing well or get worse.   °Document Released: 02/25/2005 Document Revised: 03/08/2013 Document Reviewed: 01/25/2013 °[employer]® Patient  Information ©2014 [employer]. ° °

## 2013-06-18 NOTE — ED Notes (Signed)
Pt states understanding of discharge paperwork

## 2013-06-18 NOTE — ED Provider Notes (Addendum)
CSN: [id number]     Arrival date & time 06/17/13  2213 History   First MD Initiated Contact with Patient 06/17/13 2330     Chief Complaint  Patient presents with  . Shortness of Breath  . Back Pain   (Consider location/radiation/quality/duration/timing/severity/associated sxs/prior Treatment) HPI Patient is an obese NIDDM female who presents with 3 days of lower abdominal pain. The pain is worse on the left than the right. The pain is aching and radiates to the inguinal region on both sides. It waxes and wanes in severity. It is worse with walking. No fever. No GU sx. No N/V/D. The patient has not been sexually active for > 3 years.   Pain is aching and 8/10. She was seen in clinic yesterday and prescribed Tramadol but states, "it doesn't do anything but put me to sleep".   Past Medical History  Diagnosis Date  . Asthma   . Diabetes   . Hypertension    Past Surgical History  Procedure Laterality Date  . Tubal ligation    . Lithotripsy    . Oophrectomy    . Ankle surgery     History reviewed. No pertinent family history. History  Substance Use Topics  . Smoking status: Never Smoker   . Smokeless tobacco: Not on file  . Alcohol Use: No   OB History   Grav Para Term Preterm Abortions TAB SAB Ect Mult Living                 Review of Systems  10 point ROS PERFORMED AND IS NEGATIVE WITH THE EXCEPTION OF SYMPTOMS NOTED ABOVE.    Allergies  Review of patient's allergies indicates no known allergies.  Home Medications   Current Outpatient Rx  Name  Route  Sig  Dispense  Refill  . albuterol (PROVENTIL HFA;VENTOLIN HFA) 108 (90 BASE) MCG/ACT inhaler   Inhalation   Inhale 2 puffs into the lungs every 6 (six) hours as needed. For shortness of breath         . albuterol (PROVENTIL) (2.5 MG/3ML) 0.083% nebulizer solution   Nebulization   Take 3 mLs (2.5 mg total) by nebulization every 6 (six) hours as needed for wheezing.   25 mL   1   . amLODipine-olmesartan (AZOR)  5-40 MG per tablet   Oral   Take 1 tablet by mouth daily.         . hydrochlorothiazide (HYDRODIURIL) 25 MG tablet   Oral   Take 25 mg by mouth daily.         . metFORMIN (GLUCOPHAGE) 500 MG tablet   Oral   Take 500 mg by mouth every evening.          . rosuvastatin (CRESTOR) 5 MG tablet   Oral   Take 5 mg by mouth every morning.         . traMADol (ULTRAM) 50 MG tablet   Oral   Take 50-100 mg by mouth every 6 (six) hours as needed for moderate pain.          BP 127/77  Pulse 87  Temp(Src) 98.2 F (36.8 C) (Oral)  Resp 16  SpO2 97% Physical Exam  Constitutional: She is oriented to person, place, and time. She appears well-developed and well-nourished.  HENT:  Head: Normocephalic and atraumatic.  Nose: Nose normal.  Mouth/Throat: Oropharynx is clear and moist.  Eyes: Conjunctivae are normal. Pupils are equal, round, and reactive to light.  Neck: Normal range of motion. Neck supple.  Cardiovascular: Normal rate, regular rhythm and normal heart sounds.   No murmur heard. Pulmonary/Chest: Effort normal and breath sounds normal. No respiratory distress. She has no wheezes. She has no rales. She exhibits no tenderness.  Abdominal: Soft. She exhibits no distension. There is no rebound.  Musculoskeletal: Normal range of motion. She exhibits no edema.  Neurological: She is alert and oriented to person, place, and time. No cranial nerve deficit.  Skin: Skin is warm and dry.  Psychiatric: She has a normal mood and affect. Her behavior is normal.     ED Course  Procedures (including critical care time) Labs Review Results for orders placed during the hospital encounter of 06/17/13 (from the past 24 hour(s))  CBC     Status: None   Collection Time    06/17/13 10:47 PM      Result Value Range   WBC 8.3  4.0 - 10.5 K/uL   RBC 4.61  3.87 - 5.11 MIL/uL   Hemoglobin 13.8  12.0 - 15.0 g/dL   HCT 41.0  36.0 - 46.0 %   MCV 88.9  78.0 - 100.0 fL   MCH 29.9  26.0 - 34.0  pg   MCHC 33.7  30.0 - 36.0 g/dL   RDW 14.6  11.5 - 15.5 %   Platelets 277  150 - 400 K/uL  BASIC METABOLIC PANEL     Status: None   Collection Time    06/17/13 10:47 PM      Result Value Range   Sodium 141  137 - 147 mEq/L   Potassium 4.0  3.7 - 5.3 mEq/L   Chloride 102  96 - 112 mEq/L   CO2 24  19 - 32 mEq/L   Glucose, Bld 91  70 - 99 mg/dL   BUN 9  6 - 23 mg/dL   Creatinine, Ser 0.58  0.50 - 1.10 mg/dL   Calcium 8.9  8.4 - 10.5 mg/dL   GFR calc non Af Amer >90  >90 mL/min   GFR calc Af Amer >90  >90 mL/min    Imaging Review Dg Chest 2 View  06/17/2013   CLINICAL DATA:  Shortness of breath, abdominal pain  EXAM: CHEST  2 VIEW  COMPARISON:  06/05/2012  FINDINGS: Low lung volumes. No focal consolidation. No pleural effusion or pneumothorax.  The heart is normal in size.  Mild degenerative changes of the visualized thoracolumbar spine.  IMPRESSION: No evidence of acute cardiopulmonary disease.   Electronically Signed   By: Julian Hy M.D.   On: 06/17/2013 23:59    EKG Interpretation   None      US Pelvis Complete (Final result)  Result time: 06/18/13 01:53:46    Final result by Rad Results In Interface (06/18/13 01:53:46)    Narrative:   CLINICAL DATA: Left lower quadrant abdominal pain.  EXAM: TRANSABDOMINAL AND TRANSVAGINAL ULTRASOUND OF PELVIS  DOPPLER ULTRASOUND OF OVARIES  TECHNIQUE: Both transabdominal and transvaginal ultrasound examinations of the pelvis were performed. Transabdominal technique was performed for global imaging of the pelvis including uterus, left ovary, adnexal regions, and pelvic cul-de-sac.  It was necessary to proceed with endovaginal exam following the transabdominal exam to visualize the uterus and left ovary in greater detail. Color and duplex Doppler ultrasound was utilized to evaluate blood flow to the left ovary.  COMPARISON: None.  FINDINGS: Uterus  Measurements: 8.3 x 4.9 x 5.3 cm. There is diffuse heterogeneity  of the myometrial echogenicity, grossly stable from prior studies. A 2.0 cm fibroid  is seen along the posterior myometrium.  Endometrium  Thickness: 0.3 cm, with trace fluid noted in the endometrial canal. No focal abnormality visualized.  Right ovary  Status post right-sided oophorectomy.  Left ovary  Measurements: 2.1 x 1.5 x 1.8 cm. Normal appearance/no adnexal mass, though difficult to fully characterize.  Pulsed Doppler evaluation of the left ovary demonstrates normal low-resistance arterial and venous waveforms, though this is difficult to fully characterize.  Other findings  No free fluid seen within the pelvic cul-de-sac.  IMPRESSION: 1. No evidence for ovarian torsion. Left ovary is unremarkable in appearance, though difficult to fully characterize. 2. Diffuse heterogeneity of myometrial echogenicity is grossly stable from prior studies and nonspecific in appearance; it may be related to the patient's history of pelvic inflammatory disease. Small myometrial fibroid again seen. 3. Trace fluid noted in the endometrial canal.   Electronically Signed By: Garald Balding M.D. On: 06/18/2013 01:53      MDM  Patient is feeling better. U/S notable for small uterine fibroid, no torsion. S/P pelvic exam < 48 hrs ago. Abdominal exam is benign. Stable for d/c with plan for outpatient f/u .     Elyn Peers, MD 06/18/13 2449  Elyn Peers, MD 07/05/13 [phone]

## 2013-06-19 ENCOUNTER — Other Ambulatory Visit: Payer: Medicaid Other

## 2013-07-03 ENCOUNTER — Ambulatory Visit (HOSPITAL_COMMUNITY): Payer: Medicaid Other

## 2013-07-12 ENCOUNTER — Ambulatory Visit (HOSPITAL_COMMUNITY): Admission: RE | Admit: 2013-07-12 | Payer: No Typology Code available for payment source | Source: Ambulatory Visit

## 2013-08-29 ENCOUNTER — Emergency Department (HOSPITAL_COMMUNITY): Payer: Medicaid Other

## 2013-08-29 ENCOUNTER — Encounter (HOSPITAL_COMMUNITY): Payer: Self-pay | Admitting: Emergency Medicine

## 2013-08-29 ENCOUNTER — Emergency Department (HOSPITAL_COMMUNITY)
Admission: EM | Admit: 2013-08-29 | Discharge: 2013-08-30 | Disposition: A | Discharge status: Home / Self Care | Payer: Medicaid Other | Attending: Emergency Medicine | Admitting: Emergency Medicine

## 2013-08-29 DIAGNOSIS — I10 Essential (primary) hypertension: Secondary | ICD-10-CM | POA: Insufficient documentation

## 2013-08-29 DIAGNOSIS — E119 Type 2 diabetes mellitus without complications: Secondary | ICD-10-CM | POA: Insufficient documentation

## 2013-08-29 DIAGNOSIS — J069 Acute upper respiratory infection, unspecified: Secondary | ICD-10-CM | POA: Insufficient documentation

## 2013-08-29 DIAGNOSIS — R1110 Vomiting, unspecified: Secondary | ICD-10-CM | POA: Insufficient documentation

## 2013-08-29 DIAGNOSIS — J45901 Unspecified asthma with (acute) exacerbation: Secondary | ICD-10-CM

## 2013-08-29 DIAGNOSIS — Z79899 Other long term (current) drug therapy: Secondary | ICD-10-CM | POA: Insufficient documentation

## 2013-08-29 DIAGNOSIS — R197 Diarrhea, unspecified: Secondary | ICD-10-CM | POA: Insufficient documentation

## 2013-08-29 LAB — URINALYSIS, ROUTINE W REFLEX MICROSCOPIC
BILIRUBIN URINE: NEGATIVE
Glucose, UA: NEGATIVE mg/dL
HGB URINE DIPSTICK: NEGATIVE
Ketones, ur: NEGATIVE mg/dL
Leukocytes, UA: NEGATIVE
Nitrite: NEGATIVE
PROTEIN: NEGATIVE mg/dL
SPECIFIC GRAVITY, URINE: 1.024 (ref 1.005–1.030)
Urobilinogen, UA: 0.2 mg/dL (ref 0.0–1.0)
pH: 5 (ref 5.0–8.0)

## 2013-08-29 LAB — COMPREHENSIVE METABOLIC PANEL
ALT: 22 U/L (ref 0–35)
AST: 25 U/L (ref 0–37)
Albumin: 3.7 g/dL (ref 3.5–5.2)
Alkaline Phosphatase: 90 U/L (ref 39–117)
BILIRUBIN TOTAL: 0.2 mg/dL — AB (ref 0.3–1.2)
BUN: 8 mg/dL (ref 6–23)
CO2: 28 meq/L (ref 19–32)
Calcium: 9.7 mg/dL (ref 8.4–10.5)
Chloride: 99 mEq/L (ref 96–112)
Creatinine, Ser: 0.85 mg/dL (ref 0.50–1.10)
GFR calc Af Amer: >90 mL/min (ref >90)
GFR calc non Af Amer: 79 mL/min — ABNORMAL LOW (ref >90)
Glucose, Bld: 74 mg/dL (ref 70–99)
Potassium: 3.9 mEq/L (ref 3.7–5.3)
SODIUM: 141 meq/L (ref 137–147)
Total Protein: 8 g/dL (ref 6.0–8.3)

## 2013-08-29 LAB — CBC WITH DIFFERENTIAL/PLATELET
BASOS ABS: 0 10*3/uL (ref 0.0–0.1)
Basophils Relative: 0 % (ref 0–1)
Eosinophils Absolute: 0.2 10*3/uL (ref 0.0–0.7)
Eosinophils Relative: 3 % (ref 0–5)
HCT: 43.8 % (ref 36.0–46.0)
Hemoglobin: 14.9 g/dL (ref 12.0–15.0)
LYMPHS PCT: 42 % (ref 12–46)
Lymphs Abs: 3.1 10*3/uL (ref 0.7–4.0)
MCH: 30.1 pg (ref 26.0–34.0)
MCHC: 34 g/dL (ref 30.0–36.0)
MCV: 88.5 fL (ref 78.0–100.0)
Monocytes Absolute: 0.8 10*3/uL (ref 0.1–1.0)
Monocytes Relative: 11 % (ref 3–12)
NEUTROS ABS: 3.3 10*3/uL (ref 1.7–7.7)
NEUTROS PCT: 44 % (ref 43–77)
Platelets: 291 10*3/uL (ref 150–400)
RBC: 4.95 MIL/uL (ref 3.87–5.11)
RDW: 15.2 % (ref 11.5–15.5)
WBC: 7.5 10*3/uL (ref 4.0–10.5)

## 2013-08-29 MED ORDER — PREDNISONE 20 MG PO TABS
60.0000 mg | ORAL_TABLET | Freq: Once | ORAL | Status: AC
  Administered 2013-08-29: 60 mg via ORAL
  Filled 2013-08-29: qty 3

## 2013-08-29 MED ORDER — KETOROLAC TROMETHAMINE 30 MG/ML IJ SOLN
30.0000 mg | Freq: Once | INTRAMUSCULAR | Status: AC
  Administered 2013-08-29: 30 mg via INTRAVENOUS
  Filled 2013-08-29: qty 1

## 2013-08-29 MED ORDER — IPRATROPIUM BROMIDE 0.02 % IN SOLN
0.5000 mg | Freq: Once | RESPIRATORY_TRACT | Status: AC
  Administered 2013-08-29: 0.5 mg via RESPIRATORY_TRACT
  Filled 2013-08-29: qty 2.5

## 2013-08-29 MED ORDER — ALBUTEROL SULFATE (2.5 MG/3ML) 0.083% IN NEBU
5.0000 mg | INHALATION_SOLUTION | Freq: Once | RESPIRATORY_TRACT | Status: AC
  Administered 2013-08-29: 5 mg via RESPIRATORY_TRACT
  Filled 2013-08-29: qty 6

## 2013-08-29 MED ORDER — SODIUM CHLORIDE 0.9 % IV BOLUS (SEPSIS)
1000.0000 mL | Freq: Once | INTRAVENOUS | Status: AC
  Administered 2013-08-29: 1000 mL via INTRAVENOUS

## 2013-08-29 MED ORDER — ONDANSETRON HCL 4 MG/2ML IJ SOLN
4.0000 mg | Freq: Once | INTRAMUSCULAR | Status: AC
  Administered 2013-08-29: 4 mg via INTRAVENOUS
  Filled 2013-08-29: qty 2

## 2013-08-29 NOTE — ED Notes (Signed)
Pt arrived by [facility]. Reports dry cough x 2 days and wheezing, hx of asthma, no relief with inhalers. Having diarrhea x 2 days. abd pain today and vomiting. Reports headache and fever. Mask on pt at triage.

## 2013-08-30 MED ORDER — ALBUTEROL SULFATE (2.5 MG/3ML) 0.083% IN NEBU
5.0000 mg | INHALATION_SOLUTION | Freq: Once | RESPIRATORY_TRACT | Status: AC
  Administered 2013-08-30: 5 mg via RESPIRATORY_TRACT
  Filled 2013-08-30: qty 6

## 2013-08-30 MED ORDER — AZITHROMYCIN 250 MG PO TABS: 250.0000 mg | ORAL_TABLET | Freq: Every day | ORAL | Status: DC

## 2013-08-30 MED ORDER — ONDANSETRON HCL 4 MG PO TABS: 4.0000 mg | ORAL_TABLET | Freq: Four times a day (QID) | ORAL | Status: DC

## 2013-08-30 MED ORDER — ALBUTEROL SULFATE (2.5 MG/3ML) 0.083% IN NEBU: 2.5000 mg | INHALATION_SOLUTION | Freq: Four times a day (QID) | RESPIRATORY_TRACT | Status: DC | PRN

## 2013-08-30 MED ORDER — OXYCODONE-ACETAMINOPHEN 5-325 MG PO TABS
1.0000 | ORAL_TABLET | Freq: Once | ORAL | Status: AC
Start: 2013-08-30 — End: 2013-08-30
  Administered 2013-08-30: 1 via ORAL
  Filled 2013-08-30: qty 1

## 2013-08-30 MED ORDER — HYDROCODONE-ACETAMINOPHEN 5-325 MG PO TABS: 1.0000 | ORAL_TABLET | ORAL | Status: DC | PRN

## 2013-08-30 MED ORDER — PREDNISONE 10 MG PO TABS: 20.0000 mg | ORAL_TABLET | Freq: Every day | ORAL | Status: DC

## 2013-08-30 MED ORDER — IPRATROPIUM BROMIDE 0.02 % IN SOLN
0.5000 mg | Freq: Once | RESPIRATORY_TRACT | Status: AC
  Administered 2013-08-30: 0.5 mg via RESPIRATORY_TRACT
  Filled 2013-08-30: qty 2.5

## 2013-08-30 NOTE — Discharge Instructions (Signed)
Asthma, Adult Asthma is a recurring condition in which the airways tighten and narrow. Asthma can make it difficult to breathe. It can cause coughing, wheezing, and shortness of breath. Asthma episodes (also called asthma attacks) range from minor to life-threatening. Asthma cannot be cured, but medicines and lifestyle changes can help control it. CAUSES Asthma is believed to be caused by inherited (genetic) and environmental factors, but its exact cause is unknown. Asthma may be triggered by allergens, lung infections, or irritants in the air. Asthma triggers are different for each person. Common triggers include:   Animal dander.  Dust mites.  Cockroaches.  Pollen from trees or grass.  Mold.  Smoke.  Air pollutants such as dust, household cleaners, hair sprays, aerosol sprays, paint fumes, strong chemicals, or strong odors.  Cold air, weather changes, and winds (which increase molds and pollens in the air).  Strong emotional expressions such as crying or laughing hard.  Stress.  Certain medicines (such as aspirin) or types of drugs (such as beta-blockers).  Sulfites in foods and drinks. Foods and drinks that may contain sulfites include dried fruit, potato chips, and sparkling grape juice.  Infections or inflammatory conditions such as the flu, a cold, or an inflammation of the nasal membranes (rhinitis).  Gastroesophageal reflux disease (GERD).  Exercise or strenuous activity. SYMPTOMS Symptoms may occur immediately after asthma is triggered or many hours later. Symptoms include:  Wheezing.  Excessive nighttime or early morning coughing.  Frequent or severe coughing with a common cold.  Chest tightness.  Shortness of breath. DIAGNOSIS  The diagnosis of asthma is made by a review of your medical history and a physical exam. Tests may also be performed. These may include:  Lung function studies. These tests show how much air you breath in and out.  Allergy  tests.  Imaging tests such as X-rays. TREATMENT  Asthma cannot be cured, but it can usually be controlled. Treatment involves identifying and avoiding your asthma triggers. It also involves medicines. There are 2 classes of medicine used for asthma treatment:   Controller medicines. These prevent asthma symptoms from occurring. They are usually taken every day.  Reliever or rescue medicines. These quickly relieve asthma symptoms. They are used as needed and provide short-term relief. Your health care provider will help you create an asthma action plan. An asthma action plan is a written plan for managing and treating your asthma attacks. It includes a list of your asthma triggers and how they may be avoided. It also includes information on when medicines should be taken and when their dosage should be changed. An action plan may also involve the use of a device called a peak flow meter. A peak flow meter measures how well the lungs are working. It helps you monitor your condition. HOME CARE INSTRUCTIONS   Take medicine as directed by your health care provider. Speak with your health care provider if you have questions about how or when to take the medicines.  Use a peak flow meter as directed by your health care provider. Record and keep track of readings.  Understand and use the action plan to help minimize or stop an asthma attack without needing to seek medical care.  Control your home environment in the following ways to help prevent asthma attacks:  Do not smoke. Avoid being exposed to secondhand smoke.  Change your heating and air conditioning filter regularly.  Limit your use of fireplaces and wood stoves.  Get rid of pests (such as roaches and  mice) and their droppings.  Throw away plants if you see mold on them.  Clean your floors and dust regularly. Use unscented cleaning products.  Try to have someone else vacuum for you regularly. Stay out of rooms while they are being  vacuumed and for a short while afterward. If you vacuum, use a dust mask from a hardware store, a double-layered or microfilter vacuum cleaner bag, or a vacuum cleaner with a HEPA filter.  Replace carpet with wood, tile, or vinyl flooring. Carpet can trap dander and dust.  Use allergy-proof pillows, mattress covers, and box spring covers.  Wash bed sheets and blankets every week in hot water and dry them in a dryer.  Use blankets that are made of polyester or cotton.  Clean bathrooms and kitchens with bleach. If possible, have someone repaint the walls in these rooms with mold-resistant paint. Keep out of the rooms that are being cleaned and painted.  Wash hands frequently. SEEK MEDICAL CARE IF:   You have wheezing, shortness of breath, or a cough even if taking medicine to prevent attacks.  The colored mucus you cough up (sputum) is thicker than usual.  Your sputum changes from clear or white to yellow, green, gray, or bloody.  You have any problems that may be related to the medicines you are taking (such as a rash, itching, swelling, or trouble breathing).  You are using a reliever medicine more than 2 3 times per week.  Your peak flow is still at 50 79% of you personal best after following your action plan for 1 hour. SEEK IMMEDIATE MEDICAL CARE IF:   You seem to be getting worse and are unresponsive to treatment during an asthma attack.  You are short of breath even at rest.  You get short of breath when doing very little physical activity.  You have difficulty eating, drinking, or talking due to asthma symptoms.  You develop chest pain.  You develop a fast heartbeat.  You have a bluish color to your lips or fingernails.  You are lightheaded, dizzy, or faint.  Your peak flow is less than 50% of your personal best.  You have a fever or persistent symptoms for more than 2 3 days.  You have a fever and symptoms suddenly get worse. MAKE SURE YOU:   Understand these  instructions.  Will watch your condition.  Will get help right away if you are not doing well or get worse. Document Released: 05/18/2005 Document Revised: 01/18/2013 Document Reviewed: 12/15/2012 [facility] Patient Information 2014 [place], [place].  Nausea and Vomiting Nausea is a sick feeling that often comes before throwing up (vomiting). Vomiting is a reflex where stomach contents come out of your mouth. Vomiting can cause severe loss of body fluids (dehydration). Children and elderly adults can become dehydrated quickly, especially if they also have diarrhea. Nausea and vomiting are symptoms of a condition or disease. It is important to find the cause of your symptoms. CAUSES   Direct irritation of the stomach lining. This irritation can result from increased acid production (gastroesophageal reflux disease), infection, food poisoning, taking certain medicines (such as nonsteroidal anti-inflammatory drugs), alcohol use, or tobacco use.  Signals from the brain.These signals could be caused by a headache, heat exposure, an inner ear disturbance, increased pressure in the brain from injury, infection, a tumor, or a concussion, pain, emotional stimulus, or metabolic problems.  An obstruction in the gastrointestinal tract (bowel obstruction).  Illnesses such as diabetes, hepatitis, gallbladder problems, appendicitis, kidney problems, cancer,  sepsis, atypical symptoms of a heart attack, or eating disorders.  Medical treatments such as chemotherapy and radiation.  Receiving medicine that makes you sleep (general anesthetic) during surgery. DIAGNOSIS Your caregiver may ask for tests to be done if the problems do not improve after a few days. Tests may also be done if symptoms are severe or if the reason for the nausea and vomiting is not clear. Tests may include:  Urine tests.  Blood tests.  Stool tests.  Cultures (to look for evidence of infection).  X-rays or other imaging  studies. Test results can help your caregiver make decisions about treatment or the need for additional tests. TREATMENT You need to stay well hydrated. Drink frequently but in small amounts.You may wish to drink water, sports drinks, clear broth, or eat frozen ice pops or gelatin dessert to help stay hydrated.When you eat, eating slowly may help prevent nausea.There are also some antinausea medicines that may help prevent nausea. HOME CARE INSTRUCTIONS   Take all medicine as directed by your caregiver.  If you do not have an appetite, do not force yourself to eat. However, you must continue to drink fluids.  If you have an appetite, eat a normal diet unless your caregiver tells you differently.  Eat a variety of complex carbohydrates (rice, wheat, potatoes, bread), lean meats, yogurt, fruits, and vegetables.  Avoid high-fat foods because they are more difficult to digest.  Drink enough water and fluids to keep your urine clear or pale yellow.  If you are dehydrated, ask your caregiver for specific rehydration instructions. Signs of dehydration may include:  Severe thirst.  Dry lips and mouth.  Dizziness.  Dark urine.  Decreasing urine frequency and amount.  Confusion.  Rapid breathing or pulse. SEEK IMMEDIATE MEDICAL CARE IF:   You have blood or brown flecks (like coffee grounds) in your vomit.  You have black or bloody stools.  You have a severe headache or stiff neck.  You are confused.  You have severe abdominal pain.  You have chest pain or trouble breathing.  You do not urinate at least once every 8 hours.  You develop cold or clammy skin.  You continue to vomit for longer than 24 to 48 hours.  You have a fever. MAKE SURE YOU:   Understand these instructions.  Will watch your condition.  Will get help right away if you are not doing well or get worse. Document Released: 05/18/2005 Document Revised: 08/10/2011 Document Reviewed:  10/15/2010 [facility] Patient Information 2014 [place], [place].  Diarrhea Diarrhea is frequent loose and watery bowel movements. It can cause you to feel weak and dehydrated. Dehydration can cause you to become tired and thirsty, have a dry mouth, and have decreased urination that often is dark yellow. Diarrhea is a sign of another problem, most often an infection that will not last long. In most cases, diarrhea typically lasts 2 3 days. However, it can last longer if it is a sign of something more serious. It is important to treat your diarrhea as directed by your caregive to lessen or prevent future episodes of diarrhea. CAUSES  Some common causes include:  Gastrointestinal infections caused by viruses, bacteria, or parasites.  Food poisoning or food allergies.  Certain medicines, such as antibiotics, chemotherapy, and laxatives.  Artificial sweeteners and fructose.  Digestive disorders. HOME CARE INSTRUCTIONS  Ensure adequate fluid intake (hydration): have 1 cup (8 oz) of fluid for each diarrhea episode. Avoid fluids that contain simple sugars or sports drinks, fruit  juices, whole milk products, and sodas. Your urine should be clear or pale yellow if you are drinking enough fluids. Hydrate with an oral rehydration solution that you can purchase at pharmacies, retail stores, and online. You can prepare an oral rehydration solution at home by mixing the following ingredients together:    tsp table salt.   tsp baking soda.   tsp salt substitute containing potassium chloride.  1  tablespoons sugar.  1 L (34 oz) of water.  Certain foods and beverages may increase the speed at which food moves through the gastrointestinal (GI) tract. These foods and beverages should be avoided and include:  Caffeinated and alcoholic beverages.  High-fiber foods, such as raw fruits and vegetables, nuts, seeds, and whole grain breads and cereals.  Foods and beverages sweetened with sugar alcohols, such  as xylitol, sorbitol, and mannitol.  Some foods may be well tolerated and may help thicken stool including:  Starchy foods, such as rice, toast, pasta, low-sugar cereal, oatmeal, grits, baked potatoes, crackers, and bagels.  Bananas.  Applesauce.  Add probiotic-rich foods to help increase healthy bacteria in the GI tract, such as yogurt and fermented milk products.  Wash your hands well after each diarrhea episode.  Only take over-the-counter or prescription medicines as directed by your caregiver.  Take a warm bath to relieve any burning or pain from frequent diarrhea episodes. SEEK IMMEDIATE MEDICAL CARE IF:   You are unable to keep fluids down.  You have persistent vomiting.  You have blood in your stool, or your stools are black and tarry.  You do not urinate in 6 8 hours, or there is only a small amount of very dark urine.  You have abdominal pain that increases or localizes.  You have weakness, dizziness, confusion, or lightheadedness.  You have a severe headache.  Your diarrhea gets worse or does not get better.  You have a fever or persistent symptoms for more than 2 3 days.  You have a fever and your symptoms suddenly get worse. MAKE SURE YOU:   Understand these instructions.  Will watch your condition.  Will get help right away if you are not doing well or get worse. Document Released: 05/08/2002 Document Revised: 05/04/2012 Document Reviewed: 01/24/2012 [facility] Patient Information 2014 [place], [place].  Upper Respiratory Infection, Adult An upper respiratory infection (URI) is also sometimes known as the common cold. The upper respiratory tract includes the nose, sinuses, throat, trachea, and bronchi. Bronchi are the airways leading to the lungs. Most people improve within 1 week, but symptoms can last up to 2 weeks. A residual cough may last even longer.  CAUSES Many different viruses can infect the tissues lining the upper respiratory tract. The  tissues become irritated and inflamed and often become very moist. Mucus production is also common. A cold is contagious. You can easily spread the virus to others by oral contact. This includes kissing, sharing a glass, coughing, or sneezing. Touching your mouth or nose and then touching a surface, which is then touched by another person, can also spread the virus. SYMPTOMS  Symptoms typically develop 1 to 3 days after you come in contact with a cold virus. Symptoms vary from person to person. They may include:  Runny nose.  Sneezing.  Nasal congestion.  Sinus irritation.  Sore throat.  Loss of voice (laryngitis).  Cough.  Fatigue.  Muscle aches.  Loss of appetite.  Headache.  Low-grade fever. DIAGNOSIS  You might diagnose your own cold based on familiar symptoms,  since most people get a cold 2 to 3 times a year. Your caregiver can confirm this based on your exam. Most importantly, your caregiver can check that your symptoms are not due to another disease such as strep throat, sinusitis, pneumonia, asthma, or epiglottitis. Blood tests, throat tests, and X-rays are not necessary to diagnose a common cold, but they may sometimes be helpful in excluding other more serious diseases. Your caregiver will decide if any further tests are required. RISKS AND COMPLICATIONS  You may be at risk for a more severe case of the common cold if you smoke cigarettes, have chronic heart disease (such as heart failure) or lung disease (such as asthma), or if you have a weakened immune system. The very young and very old are also at risk for more serious infections. Bacterial sinusitis, middle ear infections, and bacterial pneumonia can complicate the common cold. The common cold can worsen asthma and chronic obstructive pulmonary disease (COPD). Sometimes, these complications can require emergency medical care and may be life-threatening. PREVENTION  The best way to protect against getting a cold is to  practice good hygiene. Avoid oral or hand contact with people with cold symptoms. Wash your hands often if contact occurs. There is no clear evidence that vitamin C, vitamin E, echinacea, or exercise reduces the chance of developing a cold. However, it is always recommended to get plenty of rest and practice good nutrition. TREATMENT  Treatment is directed at relieving symptoms. There is no cure. Antibiotics are not effective, because the infection is caused by a virus, not by bacteria. Treatment may include:  Increased fluid intake. Sports drinks offer valuable electrolytes, sugars, and fluids.  Breathing heated mist or steam (vaporizer or shower).  Eating chicken soup or other clear broths, and maintaining good nutrition.  Getting plenty of rest.  Using gargles or lozenges for comfort.  Controlling fevers with ibuprofen or acetaminophen as directed by your caregiver.  Increasing usage of your inhaler if you have asthma. Zinc gel and zinc lozenges, taken in the first 24 hours of the common cold, can shorten the duration and lessen the severity of symptoms. Pain medicines may help with fever, muscle aches, and throat pain. A variety of non-prescription medicines are available to treat congestion and runny nose. Your caregiver can make recommendations and may suggest nasal or lung inhalers for other symptoms.  HOME CARE INSTRUCTIONS   Only take over-the-counter or prescription medicines for pain, discomfort, or fever as directed by your caregiver.  Use a warm mist humidifier or inhale steam from a shower to increase air moisture. This may keep secretions moist and make it easier to breathe.  Drink enough water and fluids to keep your urine clear or pale yellow.  Rest as needed.  Return to work when your temperature has returned to normal or as your caregiver advises. You may need to stay home longer to avoid infecting others. You can also use a face mask and careful hand washing to prevent  spread of the virus. SEEK MEDICAL CARE IF:   After the first few days, you feel you are getting worse rather than better.  You need your caregiver's advice about medicines to control symptoms.  You develop chills, worsening shortness of breath, or brown or red sputum. These may be signs of pneumonia.  You develop yellow or brown nasal discharge or pain in the face, especially when you bend forward. These may be signs of sinusitis.  You develop a fever, swollen neck glands, pain  with swallowing, or white areas in the back of your throat. These may be signs of strep throat. SEEK IMMEDIATE MEDICAL CARE IF:   You have a fever.  You develop severe or persistent headache, ear pain, sinus pain, or chest pain.  You develop wheezing, a prolonged cough, cough up blood, or have a change in your usual mucus (if you have chronic lung disease).  You develop sore muscles or a stiff neck. Document Released: 11/11/2000 Document Revised: 08/10/2011 Document Reviewed: 09/19/2010 [facility] Patient Information 2014 [place], [place].

## 2013-08-30 NOTE — ED Provider Notes (Signed)
CSN: [id number]     Arrival date & time 08/29/13  1812 History   First MD Initiated Contact with Patient 08/29/13 2241     Chief Complaint  Patient presents with  . Cough  . Abdominal Pain     (Consider location/radiation/quality/duration/timing/severity/associated sxs/prior Treatment) HPI   Patient to the ER by EMS with complaints of wheezing, cough, diarrhea and vomiting for 2 days, without abdominal pains. She has a past medical history of asthma, diabetes, hypertension, diabetes. She reports that she has been wheezing. She has tried using her inhaler pump but it has not been working. She has a nebulizer machine at home but does not have any solution for it. She feels as though she has had a fever at home but has not taken any medication for temperature. She denies CP, diaphoresis, SOB, abominal pain, orthopnea or lower extremity swelling.  The patients oxygen saturation is 99 %.    Past Medical History  Diagnosis Date  . Asthma   . Diabetes   . Hypertension    Past Surgical History  Procedure Laterality Date  . Tubal ligation    . Lithotripsy    . Oophrectomy    . Ankle surgery     History reviewed. No pertinent family history. History  Substance Use Topics  . Smoking status: Never Smoker   . Smokeless tobacco: Not on file  . Alcohol Use: No   OB History   Grav Para Term Preterm Abortions TAB SAB Ect Mult Living                 Review of Systems  The patient denies anorexia, fever, weight loss,, vision loss, decreased hearing, hoarseness, chest pain, syncope, dyspnea on exertion, peripheral edema, balance deficits, hemoptysis, abdominal pain, melena, hematochezia, severe indigestion/heartburn, hematuria, incontinence, genital sores, muscle weakness, suspicious skin lesions, transient blindness, difficulty walking, depression, unusual weight change, abnormal bleeding, enlarged lymph nodes, angioedema, and breast masses.   Allergies  Review of patient's allergies  indicates no known allergies.  Home Medications   Current Outpatient Rx  Name  Route  Sig  Dispense  Refill  . albuterol (PROVENTIL HFA;VENTOLIN HFA) 108 (90 BASE) MCG/ACT inhaler   Inhalation   Inhale 2 puffs into the lungs every 6 (six) hours as needed. For shortness of breath         . albuterol (PROVENTIL) (2.5 MG/3ML) 0.083% nebulizer solution   Nebulization   Take 3 mLs (2.5 mg total) by nebulization every 6 (six) hours as needed for wheezing.   25 mL   1   . amLODipine-olmesartan (AZOR) 5-40 MG per tablet   Oral   Take 1 tablet by mouth daily.         . hydrochlorothiazide (HYDRODIURIL) 25 MG tablet   Oral   Take 25 mg by mouth daily.         . metFORMIN (GLUCOPHAGE) 500 MG tablet   Oral   Take 500 mg by mouth every evening.          . rosuvastatin (CRESTOR) 5 MG tablet   Oral   Take 5 mg by mouth at bedtime.          . traMADol (ULTRAM) 50 MG tablet   Oral   Take 50-100 mg by mouth every 6 (six) hours as needed for moderate pain.         Marland Kitchen albuterol (PROVENTIL) (2.5 MG/3ML) 0.083% nebulizer solution   Nebulization   Take 3 mLs (2.5 mg total) by nebulization  every 6 (six) hours as needed for wheezing or shortness of breath.   75 mL   12   . azithromycin (ZITHROMAX) 250 MG tablet   Oral   Take 1 tablet (250 mg total) by mouth daily. Take first 2 tablets together, then 1 every day until finished.   6 tablet   0   . HYDROcodone-acetaminophen (NORCO/VICODIN) 5-325 MG per tablet   Oral   Take 1-2 tablets by mouth every 4 (four) hours as needed.   16 tablet   0   . ondansetron (ZOFRAN) 4 MG tablet   Oral   Take 1 tablet (4 mg total) by mouth every 6 (six) hours.   12 tablet   0   . predniSONE (DELTASONE) 10 MG tablet   Oral   Take 2 tablets (20 mg total) by mouth daily.   21 tablet   0     Prednisone dose pack directions:   6 tabs on day ...    BP 103/64  Pulse 94  Temp(Src) 98.8 F (37.1 C) (Oral)  Resp 20  Ht 5\' 4"  (1.626 m)   Wt 252 lb (114.306 kg)  BMI 43.23 kg/m2  SpO2 94% Physical Exam  Nursing note and vitals reviewed. Constitutional: She appears well-developed and well-nourished. No distress.  HENT:  Head: Normocephalic and atraumatic.  Eyes: Pupils are equal, round, and reactive to light.  Neck: Normal range of motion. Neck supple.  Cardiovascular: Normal rate and regular rhythm.   Pulmonary/Chest: Effort normal. No respiratory distress. She has wheezes. She has no rhonchi.  Coughing  Abdominal: Soft. Bowel sounds are normal. There is no tenderness.  Exam limited to body habitus  Neurological: She is alert.  Skin: Skin is warm and dry.    ED Course  Procedures (including critical care time) Labs Review Labs Reviewed  COMPREHENSIVE METABOLIC PANEL - Abnormal; Notable for the following:    Total Bilirubin 0.2 (*)    GFR calc non Af Amer 79 (*)    All other components within normal limits  URINALYSIS, ROUTINE W REFLEX MICROSCOPIC - Abnormal; Notable for the following:    APPearance CLOUDY (*)    All other components within normal limits  CBC WITH DIFFERENTIAL   Imaging Review Dg Chest 2 View  08/29/2013   CLINICAL DATA:  Cough for 2 days, wheezing, history of asthma  EXAM: CHEST  2 VIEW  COMPARISON:  [place] ART/VEN ABD/PELV/SCROTUM DOP dated 06/18/2013; DG CHEST 2 VIEW dated 06/17/2013  FINDINGS: The heart size and vascular pattern are normal. There is minimal thin linear density in the right middle lobe. There are mild, well-defined interstitial opacities in the lingula. There are no pleural effusions.  IMPRESSION: Mild well-defined linear densities in both midlung zones most consistent with atelectasis. Pneumonia is considered unlikely.   Electronically Signed   By: Skipper Cliche M.D.   On: 08/29/2013 19:08     EKG Interpretation None      MDM   Final diagnoses:  URI (upper respiratory infection)  Vomiting and diarrhea  Asthma exacerbation    This patient has no abdominal pain with  diarrhea, vomiting and wheezing. She was brought in by EMS and has been in the ER for over 5 hours without any episodes of vomiting or diarrhea. Her O2 sats have remained > 95% on room air. She received 2 breathing treatments and steroids as well as pain medication which greatly helped her symptoms. Her symptoms sound viral but the chest xray shows atelectasis with pneumonia  less likely... due to her symptoms will treat with abx.   Her labs are all within normal limits and her wheezing is much improved, at this time, she does not meet requirements for inpatient. I advised the patient that if her symptoms change, worsen or persist that she must return to the ED because things could change but for right now she is healthy enough for home.  Rx: Azithromycin, albuterol solution, zofran, Vicodin, Prednisone dose pack  48 y.o.Patricia Ramsey's evaluation in the Emergency Department is complete. It has been determined that no acute conditions requiring further emergency intervention are present at this time. The patient/guardian have been advised of the diagnosis and plan. We have discussed signs and symptoms that warrant return to the ED, such as changes or worsening in symptoms.  Vital signs are stable at discharge. Filed Vitals:   08/30/13 0000  BP: 103/64  Pulse: 94  Temp:   Resp:     Patient/guardian has voiced understanding and agreed to follow-up with the PCP or specialist.     Linus Mako, PA-C 08/30/13 0127

## 2013-09-01 NOTE — ED Provider Notes (Signed)
Medical screening examination/treatment/procedure(s) were performed by non-physician practitioner and as supervising physician I was immediately available for consultation/collaboration.   EKG Interpretation None        Mariea Clonts, MD 09/01/13 2037

## 2013-12-04 ENCOUNTER — Ambulatory Visit
Admission: RE | Admit: 2013-12-04 | Discharge: 2013-12-04 | Disposition: A | Discharge status: Home / Self Care | Payer: Medicaid Other | Source: Ambulatory Visit | Attending: Orthopedic Surgery | Admitting: Orthopedic Surgery

## 2013-12-04 ENCOUNTER — Other Ambulatory Visit: Payer: Self-pay | Admitting: Orthopedic Surgery

## 2013-12-04 DIAGNOSIS — R609 Edema, unspecified: Secondary | ICD-10-CM

## 2013-12-04 DIAGNOSIS — M25561 Pain in right knee: Secondary | ICD-10-CM

## 2014-01-17 ENCOUNTER — Other Ambulatory Visit: Payer: Self-pay | Admitting: Orthopedic Surgery

## 2014-01-17 ENCOUNTER — Ambulatory Visit
Admission: RE | Admit: 2014-01-17 | Discharge: 2014-01-17 | Disposition: A | Discharge status: Home / Self Care | Payer: Medicaid Other | Source: Ambulatory Visit | Attending: Orthopedic Surgery | Admitting: Orthopedic Surgery

## 2014-01-17 ENCOUNTER — Encounter (INDEPENDENT_AMBULATORY_CARE_PROVIDER_SITE_OTHER): Payer: Self-pay

## 2014-01-17 DIAGNOSIS — M5430 Sciatica, unspecified side: Secondary | ICD-10-CM

## 2014-02-01 ENCOUNTER — Other Ambulatory Visit: Payer: Self-pay | Admitting: Orthopedic Surgery

## 2014-02-01 ENCOUNTER — Ambulatory Visit
Admission: RE | Admit: 2014-02-01 | Discharge: 2014-02-01 | Disposition: A | Discharge status: Home / Self Care | Payer: Medicaid Other | Source: Ambulatory Visit | Attending: Orthopedic Surgery | Admitting: Orthopedic Surgery

## 2014-02-01 DIAGNOSIS — M2391 Unspecified internal derangement of right knee: Secondary | ICD-10-CM

## 2014-02-24 ENCOUNTER — Encounter (HOSPITAL_COMMUNITY): Payer: Self-pay | Admitting: Emergency Medicine

## 2014-02-24 ENCOUNTER — Emergency Department (HOSPITAL_COMMUNITY)
Admission: EM | Admit: 2014-02-24 | Discharge: 2014-02-24 | Disposition: A | Discharge status: Home / Self Care | Payer: Medicaid Other | Attending: Emergency Medicine | Admitting: Emergency Medicine

## 2014-02-24 DIAGNOSIS — L02219 Cutaneous abscess of trunk, unspecified: Secondary | ICD-10-CM | POA: Insufficient documentation

## 2014-02-24 DIAGNOSIS — Z79899 Other long term (current) drug therapy: Secondary | ICD-10-CM | POA: Diagnosis not present

## 2014-02-24 DIAGNOSIS — L0291 Cutaneous abscess, unspecified: Secondary | ICD-10-CM

## 2014-02-24 DIAGNOSIS — J45909 Unspecified asthma, uncomplicated: Secondary | ICD-10-CM | POA: Diagnosis not present

## 2014-02-24 DIAGNOSIS — E119 Type 2 diabetes mellitus without complications: Secondary | ICD-10-CM | POA: Insufficient documentation

## 2014-02-24 DIAGNOSIS — L03319 Cellulitis of trunk, unspecified: Secondary | ICD-10-CM | POA: Diagnosis present

## 2014-02-24 DIAGNOSIS — I10 Essential (primary) hypertension: Secondary | ICD-10-CM | POA: Diagnosis not present

## 2014-02-24 MED ORDER — LIDOCAINE-EPINEPHRINE (PF) 2 %-1:200000 IJ SOLN
10.0000 mL | Freq: Once | INTRAMUSCULAR | Status: AC
  Administered 2014-02-24: 10 mL
  Filled 2014-02-24: qty 20

## 2014-02-24 MED ORDER — OXYCODONE-ACETAMINOPHEN 5-325 MG PO TABS
2.0000 | ORAL_TABLET | Freq: Once | ORAL | Status: AC
Start: 2014-02-24 — End: 2014-02-24
  Administered 2014-02-24: 2 via ORAL
  Filled 2014-02-24: qty 2

## 2014-02-24 MED ORDER — OXYCODONE-ACETAMINOPHEN 5-325 MG PO TABS: 2.0000 | ORAL_TABLET | ORAL | Status: DC | PRN

## 2014-02-24 MED ORDER — DOXYCYCLINE HYCLATE 100 MG PO CAPS: 100.0000 mg | ORAL_CAPSULE | Freq: Two times a day (BID) | ORAL | Status: DC

## 2014-02-24 MED ORDER — DOXYCYCLINE HYCLATE 100 MG PO TABS
100.0000 mg | ORAL_TABLET | Freq: Once | ORAL | Status: AC
  Administered 2014-02-24: 100 mg via ORAL
  Filled 2014-02-24: qty 1

## 2014-02-24 MED ORDER — ONDANSETRON 4 MG PO TBDP
4.0000 mg | ORAL_TABLET | Freq: Once | ORAL | Status: AC
  Administered 2014-02-24: 4 mg via ORAL
  Filled 2014-02-24: qty 1

## 2014-02-24 NOTE — ED Notes (Signed)
The pelvic cart is set up.

## 2014-02-24 NOTE — ED Notes (Signed)
Patient arrives with complaint of labia abscess. States that she noticed 2 bumps forming and they began to drain. Describes drainage as bloody puss. Explains that she placed guaze there to absorb the drainage and it became completely soaked. Presents tonight because the pain has become worse and the drainage has not stopped. Denies fever and urinary symptoms.

## 2014-02-24 NOTE — ED Provider Notes (Signed)
CSN: [id number]     Arrival date & time 02/24/14  0408 History   First MD Initiated Contact with Patient 02/24/14 [phone]     Chief Complaint  Patient presents with  . Abscess  . Groin Swelling      HPI  Patient presents with pain in her right groin area of pubic hair. And states it was swollen and sore and began draining.  He presents here for evaluation. No past similar episodes of subcutaneous abscesses.  Past Medical History  Diagnosis Date  . Asthma   . Diabetes   . Hypertension    Past Surgical History  Procedure Laterality Date  . Tubal ligation    . Lithotripsy    . Oophrectomy    . Ankle surgery     History reviewed. No pertinent family history. History  Substance Use Topics  . Smoking status: Never Smoker   . Smokeless tobacco: Not on file  . Alcohol Use: No   OB History   Grav Para Term Preterm Abortions TAB SAB Ect Mult Living                 Review of Systems  Constitutional: Negative for fever, chills, diaphoresis, appetite change and fatigue.  HENT: Negative for mouth sores, sore throat and trouble swallowing.   Eyes: Negative for visual disturbance.  Respiratory: Negative for cough, chest tightness, shortness of breath and wheezing.   Cardiovascular: Negative for chest pain.  Gastrointestinal: Negative for nausea, vomiting, abdominal pain, diarrhea and abdominal distention.  Endocrine: Negative for polydipsia, polyphagia and polyuria.  Genitourinary: Negative for dysuria, frequency and hematuria.       Drainage swelling and pain in the right groin/inguinal region.  Musculoskeletal: Negative for gait problem.  Skin: Negative for color change, pallor and rash.  Neurological: Negative for dizziness, syncope, light-headedness and headaches.  Hematological: Does not bruise/bleed easily.  Psychiatric/Behavioral: Negative for behavioral problems and confusion.      Allergies  Review of patient's allergies indicates no known allergies.  Home  Medications   Prior to Admission medications   Medication Sig Start Date End Date Taking? Authorizing Provider  albuterol (PROVENTIL HFA;VENTOLIN HFA) 108 (90 BASE) MCG/ACT inhaler Inhale 2 puffs into the lungs every 6 (six) hours as needed. For shortness of breath   Yes Historical Provider, MD  albuterol (PROVENTIL) (2.5 MG/3ML) 0.083% nebulizer solution Take 3 mLs (2.5 mg total) by nebulization every 6 (six) hours as needed for wheezing. 06/05/12  Yes Veryl Speak, MD  amLODipine-olmesartan (AZOR) 5-40 MG per tablet Take 1 tablet by mouth daily.   Yes Historical Provider, MD  hydrochlorothiazide (HYDRODIURIL) 25 MG tablet Take 25 mg by mouth daily.   Yes Historical Provider, MD  metFORMIN (GLUCOPHAGE) 500 MG tablet Take 500 mg by mouth every evening.    Yes Historical Provider, MD  doxycycline (VIBRAMYCIN) 100 MG capsule Take 1 capsule (100 mg total) by mouth 2 (two) times daily. 02/24/14   Tanna Furry, MD  oxyCODONE-acetaminophen (PERCOCET/ROXICET) 5-325 MG per tablet Take 2 tablets by mouth every 4 (four) hours as needed. 02/24/14   Tanna Furry, MD   BP 145/91  Pulse 53  Temp(Src) 98.2 F (36.8 C) (Oral)  Resp 16  Ht 5\' 3"  (1.6 m)  Wt 235 lb (106.595 kg)  BMI 41.64 kg/m2  SpO2 100% Physical Exam  Constitutional: She is oriented to person, place, and time. She appears well-developed and well-nourished. No distress.  HENT:  Head: Normocephalic.  Eyes: Conjunctivae are normal. Pupils are equal,  round, and reactive to light. No scleral icterus.  Neck: Normal range of motion. Neck supple. No thyromegaly present.  Cardiovascular: Normal rate and regular rhythm.  Exam reveals no gallop and no friction rub.   No murmur heard. Pulmonary/Chest: Effort normal and breath sounds normal. No respiratory distress. She has no wheezes. She has no rales.  Abdominal: Soft. Bowel sounds are normal. She exhibits no distension. There is no tenderness. There is no rebound.    Musculoskeletal: Normal range of  motion.  Neurological: She is alert and oriented to person, place, and time.  Skin: Skin is warm and dry. No rash noted.  Psychiatric: She has a normal mood and affect. Her behavior is normal.    ED Course  INCISION AND DRAINAGE Date/Time: 02/24/2014 7:45 AM Performed by: Tanna Furry Authorized by: Tanna Furry Consent: Verbal consent obtained. Consent given by: patient Patient understanding: patient states understanding of the procedure being performed Patient identity confirmed: verbally with patient Type: abscess Location: Right mons pubis. Anesthesia: local infiltration Local anesthetic: lidocaine 2% with epinephrine Patient sedated: no Scalpel size: 11 Incision type: single straight Complexity: simple Drainage: purulent Drainage amount: scant Wound treatment: drain placed Packing material: 1/4 in iodoform gauze Patient tolerance: Patient tolerated the procedure well with no immediate complications.   (including critical care time) Labs Review Labs Reviewed - No data to display  Imaging Review No results found.   EKG Interpretation None      MDM   Final diagnoses:  Abscess    Patient instructed about removing the gauze daily until it is all removed. Start soaks after that. Pressures were doxycycline. Recheck with any worsening.    Tanna Furry, MD 02/24/14 [phone]

## 2014-02-24 NOTE — Discharge Instructions (Signed)
There is gauze in the wound to keep the wound draining. Move about 1/2 inch of gauze each day until it is out. Return here with any difficulties or any worsening.  Abscess An abscess is an infected area that contains a collection of pus and debris.It can occur in almost any part of the body. An abscess is also known as a furuncle or boil. CAUSES  An abscess occurs when tissue gets infected. This can occur from blockage of oil or sweat glands, infection of hair follicles, or a minor injury to the skin. As the body tries to fight the infection, pus collects in the area and creates pressure under the skin. This pressure causes pain. People with weakened immune systems have difficulty fighting infections and get certain abscesses more often.  SYMPTOMS Usually an abscess develops on the skin and becomes a painful mass that is red, warm, and tender. If the abscess forms under the skin, you may feel a moveable soft area under the skin. Some abscesses break open (rupture) on their own, but most will continue to get worse without care. The infection can spread deeper into the body and eventually into the bloodstream, causing you to feel ill.  DIAGNOSIS  Your caregiver will take your medical history and perform a physical exam. A sample of fluid may also be taken from the abscess to determine what is causing your infection. TREATMENT  Your caregiver may prescribe antibiotic medicines to fight the infection. However, taking antibiotics alone usually does not cure an abscess. Your caregiver may need to make a small cut (incision) in the abscess to drain the pus. In some cases, gauze is packed into the abscess to reduce pain and to continue draining the area. HOME CARE INSTRUCTIONS   Only take over-the-counter or prescription medicines for pain, discomfort, or fever as directed by your caregiver.  If you were prescribed antibiotics, take them as directed. Finish them even if you start to feel better.  If  gauze is used, follow your caregiver's directions for changing the gauze.  To avoid spreading the infection:  Keep your draining abscess covered with a bandage.  Wash your hands well.  Do not share personal care items, towels, or whirlpools with others.  Avoid skin contact with others.  Keep your skin and clothes clean around the abscess.  Keep all follow-up appointments as directed by your caregiver. SEEK MEDICAL CARE IF:   You have increased pain, swelling, redness, fluid drainage, or bleeding.  You have muscle aches, chills, or a general ill feeling.  You have a fever. MAKE SURE YOU:   Understand these instructions.  Will watch your condition.  Will get help right away if you are not doing well or get worse. Document Released: 02/25/2005 Document Revised: 11/17/2011 Document Reviewed: 07/31/2011 [facility] Patient Information 2015 [place], [place]. This information is not intended to replace advice given to you by your health care provider. Make sure you discuss any questions you have with your health care provider.

## 2014-02-28 ENCOUNTER — Other Ambulatory Visit: Payer: Self-pay | Admitting: Orthopedic Surgery

## 2014-02-28 DIAGNOSIS — M25561 Pain in right knee: Secondary | ICD-10-CM

## 2014-03-07 ENCOUNTER — Ambulatory Visit (HOSPITAL_COMMUNITY): Admission: RE | Admit: 2014-03-07 | Payer: Medicaid Other | Source: Ambulatory Visit

## 2014-03-08 ENCOUNTER — Ambulatory Visit
Admission: RE | Admit: 2014-03-08 | Discharge: 2014-03-08 | Disposition: A | Discharge status: Home / Self Care | Payer: Medicaid Other | Source: Ambulatory Visit | Attending: Orthopedic Surgery | Admitting: Orthopedic Surgery

## 2014-03-08 DIAGNOSIS — M25561 Pain in right knee: Secondary | ICD-10-CM

## 2014-07-24 ENCOUNTER — Other Ambulatory Visit (HOSPITAL_COMMUNITY)
Admission: RE | Admit: 2014-07-24 | Discharge: 2014-07-24 | Disposition: A | Discharge status: Home / Self Care | Payer: Medicaid Other | Source: Ambulatory Visit | Attending: Family Medicine | Admitting: Family Medicine

## 2014-07-24 ENCOUNTER — Other Ambulatory Visit: Payer: Self-pay | Admitting: Family Medicine

## 2014-07-24 DIAGNOSIS — Z01419 Encounter for gynecological examination (general) (routine) without abnormal findings: Secondary | ICD-10-CM | POA: Diagnosis not present

## 2014-07-25 LAB — CYTOLOGY - PAP

## 2014-09-05 ENCOUNTER — Other Ambulatory Visit (HOSPITAL_COMMUNITY): Payer: Self-pay | Admitting: Orthopaedic Surgery

## 2014-09-19 ENCOUNTER — Encounter (HOSPITAL_BASED_OUTPATIENT_CLINIC_OR_DEPARTMENT_OTHER): Payer: Self-pay | Admitting: *Deleted

## 2014-09-21 ENCOUNTER — Encounter (HOSPITAL_BASED_OUTPATIENT_CLINIC_OR_DEPARTMENT_OTHER): Payer: Self-pay | Admitting: *Deleted

## 2014-09-21 ENCOUNTER — Encounter (HOSPITAL_BASED_OUTPATIENT_CLINIC_OR_DEPARTMENT_OTHER)
Admission: RE | Disposition: A | Discharge status: Home / Self Care | Payer: Self-pay | Source: Ambulatory Visit | Attending: Orthopaedic Surgery

## 2014-09-21 ENCOUNTER — Ambulatory Visit (HOSPITAL_BASED_OUTPATIENT_CLINIC_OR_DEPARTMENT_OTHER)
Admission: RE | Admit: 2014-09-21 | Discharge: 2014-09-21 | Disposition: A | Discharge status: Home / Self Care | Payer: Medicaid Other | Source: Ambulatory Visit | Attending: Orthopaedic Surgery | Admitting: Orthopaedic Surgery

## 2014-09-21 ENCOUNTER — Ambulatory Visit (HOSPITAL_BASED_OUTPATIENT_CLINIC_OR_DEPARTMENT_OTHER): Payer: Medicaid Other | Admitting: Anesthesiology

## 2014-09-21 DIAGNOSIS — Z6841 Body Mass Index (BMI) 40.0 and over, adult: Secondary | ICD-10-CM | POA: Insufficient documentation

## 2014-09-21 DIAGNOSIS — I10 Essential (primary) hypertension: Secondary | ICD-10-CM | POA: Diagnosis not present

## 2014-09-21 DIAGNOSIS — Z79891 Long term (current) use of opiate analgesic: Secondary | ICD-10-CM | POA: Insufficient documentation

## 2014-09-21 DIAGNOSIS — Z791 Long term (current) use of non-steroidal anti-inflammatories (NSAID): Secondary | ICD-10-CM | POA: Diagnosis not present

## 2014-09-21 DIAGNOSIS — M94261 Chondromalacia, right knee: Secondary | ICD-10-CM

## 2014-09-21 DIAGNOSIS — J45909 Unspecified asthma, uncomplicated: Secondary | ICD-10-CM | POA: Insufficient documentation

## 2014-09-21 DIAGNOSIS — M2241 Chondromalacia patellae, right knee: Secondary | ICD-10-CM | POA: Diagnosis not present

## 2014-09-21 DIAGNOSIS — M25561 Pain in right knee: Secondary | ICD-10-CM | POA: Diagnosis present

## 2014-09-21 DIAGNOSIS — G4730 Sleep apnea, unspecified: Secondary | ICD-10-CM | POA: Insufficient documentation

## 2014-09-21 DIAGNOSIS — E119 Type 2 diabetes mellitus without complications: Secondary | ICD-10-CM | POA: Insufficient documentation

## 2014-09-21 DIAGNOSIS — M7121 Synovial cyst of popliteal space [Baker], right knee: Secondary | ICD-10-CM | POA: Insufficient documentation

## 2014-09-21 DIAGNOSIS — Z79899 Other long term (current) drug therapy: Secondary | ICD-10-CM | POA: Insufficient documentation

## 2014-09-21 HISTORY — PX: KNEE ARTHROSCOPY: SHX127

## 2014-09-21 HISTORY — PX: CHONDROPLASTY: SHX5177

## 2014-09-21 HISTORY — DX: Sleep apnea, unspecified: G47.30

## 2014-09-21 LAB — GLUCOSE, CAPILLARY
Glucose-Capillary: 98 mg/dL (ref 70–99)
Glucose-Capillary: 107 mg/dL — ABNORMAL HIGH (ref 70–99)

## 2014-09-21 SURGERY — ARTHROSCOPY, KNEE
Anesthesia: General | Site: Knee | Laterality: Right

## 2014-09-21 MED ORDER — LIDOCAINE HCL (CARDIAC) 20 MG/ML IV SOLN
INTRAVENOUS | Status: DC | PRN
  Administered 2014-09-21: 80 mg via INTRAVENOUS

## 2014-09-21 MED ORDER — HYDROMORPHONE HCL 1 MG/ML IJ SOLN
0.2500 mg | INTRAMUSCULAR | Status: DC | PRN
  Administered 2014-09-21 (×3): 0.5 mg via INTRAVENOUS

## 2014-09-21 MED ORDER — MEPERIDINE HCL 25 MG/ML IJ SOLN: 6.2500 mg | INTRAMUSCULAR | Status: DC | PRN

## 2014-09-21 MED ORDER — HYDROMORPHONE HCL 1 MG/ML IJ SOLN
INTRAMUSCULAR | Status: AC
  Filled 2014-09-21: qty 1

## 2014-09-21 MED ORDER — PROPOFOL 10 MG/ML IV BOLUS
INTRAVENOUS | Status: DC | PRN
  Administered 2014-09-21: 200 mg via INTRAVENOUS

## 2014-09-21 MED ORDER — KETOROLAC TROMETHAMINE 30 MG/ML IJ SOLN: 30.0000 mg | Freq: Once | INTRAMUSCULAR | Status: DC | PRN

## 2014-09-21 MED ORDER — ONDANSETRON HCL 4 MG/2ML IJ SOLN
INTRAMUSCULAR | Status: DC | PRN
  Administered 2014-09-21: 4 mg via INTRAVENOUS

## 2014-09-21 MED ORDER — OXYCODONE HCL 5 MG PO TABS
5.0000 mg | ORAL_TABLET | Freq: Once | ORAL | Status: AC | PRN
  Administered 2014-09-21: 5 mg via ORAL

## 2014-09-21 MED ORDER — OXYCODONE HCL 5 MG PO TABS
ORAL_TABLET | ORAL | Status: AC
  Filled 2014-09-21: qty 1

## 2014-09-21 MED ORDER — FENTANYL CITRATE (PF) 100 MCG/2ML IJ SOLN
INTRAMUSCULAR | Status: DC | PRN
  Administered 2014-09-21: 100 ug via INTRAVENOUS

## 2014-09-21 MED ORDER — FENTANYL CITRATE (PF) 100 MCG/2ML IJ SOLN
INTRAMUSCULAR | Status: AC
  Filled 2014-09-21: qty 4

## 2014-09-21 MED ORDER — LACTATED RINGERS IV SOLN
INTRAVENOUS | Status: DC
Start: 2014-09-21 — End: 2014-09-21
  Administered 2014-09-21: 07:00:00 via INTRAVENOUS

## 2014-09-21 MED ORDER — MIDAZOLAM HCL 2 MG/2ML IJ SOLN
INTRAMUSCULAR | Status: AC
  Filled 2014-09-21: qty 2

## 2014-09-21 MED ORDER — SODIUM CHLORIDE 0.9 % IR SOLN
Status: DC | PRN
  Administered 2014-09-21: 3000 mL

## 2014-09-21 MED ORDER — OXYCODONE HCL 5 MG/5ML PO SOLN: 5.0000 mg | Freq: Once | ORAL | Status: AC | PRN

## 2014-09-21 MED ORDER — BUPIVACAINE HCL (PF) 0.25 % IJ SOLN
INTRAMUSCULAR | Status: AC
  Filled 2014-09-21: qty 30

## 2014-09-21 MED ORDER — OXYCODONE-ACETAMINOPHEN 5-325 MG PO TABS: 2.0000 | ORAL_TABLET | ORAL | Status: DC | PRN

## 2014-09-21 MED ORDER — DEXAMETHASONE SODIUM PHOSPHATE 4 MG/ML IJ SOLN
INTRAMUSCULAR | Status: DC | PRN
  Administered 2014-09-21: 10 mg via INTRAVENOUS

## 2014-09-21 MED ORDER — IBUPROFEN 100 MG/5ML PO SUSP: 200.0000 mg | Freq: Four times a day (QID) | ORAL | Status: DC | PRN

## 2014-09-21 MED ORDER — MIDAZOLAM HCL 2 MG/2ML IJ SOLN
1.0000 mg | INTRAMUSCULAR | Status: DC | PRN
  Administered 2014-09-21: 2 mg via INTRAVENOUS

## 2014-09-21 MED ORDER — IBUPROFEN 200 MG PO TABS: 200.0000 mg | ORAL_TABLET | Freq: Four times a day (QID) | ORAL | Status: DC | PRN

## 2014-09-21 MED ORDER — SCOPOLAMINE 1 MG/3DAYS TD PT72: 1.0000 | MEDICATED_PATCH | TRANSDERMAL | Status: DC

## 2014-09-21 MED ORDER — BUPIVACAINE-EPINEPHRINE 0.25% -1:200000 IJ SOLN
INTRAMUSCULAR | Status: DC | PRN
  Administered 2014-09-21: 30 mL

## 2014-09-21 SURGICAL SUPPLY — 41 items
APL SKNCLS STERI-STRIP NONHPOA (GAUZE/BANDAGES/DRESSINGS) ×2
BANDAGE ELASTIC 6 VELCRO ST LF (GAUZE/BANDAGES/DRESSINGS) ×8 IMPLANT
BENZOIN TINCTURE PRP APPL 2/3 (GAUZE/BANDAGES/DRESSINGS) ×4 IMPLANT
BLADE 4.2CUDA (BLADE) ×4 IMPLANT
BLADE CUDA 5.5 (BLADE) IMPLANT
BLADE GREAT WHITE 4.2 (BLADE) IMPLANT
BLADE GREAT WHITE 4.2MM (BLADE)
CLOSURE WOUND 1/4X4 (GAUZE/BANDAGES/DRESSINGS) ×1
DRAPE ARTHROSCOPY W/POUCH 114 (DRAPES) ×4 IMPLANT
DRSG TEGADERM 4X4.75 (GAUZE/BANDAGES/DRESSINGS) ×4 IMPLANT
DURAPREP 26ML APPLICATOR (WOUND CARE) ×4 IMPLANT
ELECT MENISCUS 165MM 90D (ELECTRODE) IMPLANT
ELECT REM PT RETURN 9FT ADLT (ELECTROSURGICAL)
ELECTRODE REM PT RTRN 9FT ADLT (ELECTROSURGICAL) IMPLANT
GAUZE SPONGE 4X4 12PLY STRL (GAUZE/BANDAGES/DRESSINGS) ×4 IMPLANT
GLOVE BIO SURGEON STRL SZ 6.5 (GLOVE) ×2 IMPLANT
GLOVE BIO SURGEON STRL SZ7.5 (GLOVE) ×4 IMPLANT
GLOVE BIO SURGEONS STRL SZ 6.5 (GLOVE) ×1
GLOVE BIOGEL PI IND STRL 7.0 (GLOVE) ×2 IMPLANT
GLOVE BIOGEL PI IND STRL 8 (GLOVE) ×2 IMPLANT
GLOVE BIOGEL PI INDICATOR 7.0 (GLOVE) ×4
GLOVE BIOGEL PI INDICATOR 8 (GLOVE) ×2
GOWN STRL REUS W/ TWL LRG LVL3 (GOWN DISPOSABLE) ×2 IMPLANT
GOWN STRL REUS W/TWL LRG LVL3 (GOWN DISPOSABLE) ×8
HOLDER KNEE FOAM BLUE (MISCELLANEOUS) ×4 IMPLANT
KNEE WRAP E Z 3 GEL PACK (MISCELLANEOUS) IMPLANT
MANIFOLD NEPTUNE II (INSTRUMENTS) ×3 IMPLANT
PACK ARTHROSCOPY DSU (CUSTOM PROCEDURE TRAY) ×4 IMPLANT
PACK BASIN DAY SURGERY FS (CUSTOM PROCEDURE TRAY) ×4 IMPLANT
PAD CAST 4YDX4 CTTN HI CHSV (CAST SUPPLIES) ×2 IMPLANT
PADDING CAST ABS 4INX4YD NS (CAST SUPPLIES) ×2
PADDING CAST ABS 6INX4YD NS (CAST SUPPLIES)
PADDING CAST ABS COTTON 4X4 ST (CAST SUPPLIES) ×1 IMPLANT
PADDING CAST ABS COTTON 6X4 NS (CAST SUPPLIES) IMPLANT
PADDING CAST COTTON 4X4 STRL (CAST SUPPLIES) ×4
PENCIL BUTTON HOLSTER BLD 10FT (ELECTRODE) IMPLANT
SET ARTHROSCOPY TUBING (MISCELLANEOUS) ×4
SET ARTHROSCOPY TUBING LN (MISCELLANEOUS) ×2 IMPLANT
STRIP CLOSURE SKIN 1/4X4 (GAUZE/BANDAGES/DRESSINGS) ×3 IMPLANT
TOWEL OR 17X24 6PK STRL BLUE (TOWEL DISPOSABLE) ×4 IMPLANT
WATER STERILE IRR 1000ML POUR (IV SOLUTION) ×4 IMPLANT

## 2014-09-21 NOTE — Transfer of Care (Signed)
Immediate Anesthesia Transfer of Care Note  Patient: Patricia Ramsey  Procedure(s) Performed: Procedure(s): CHONDROPLASTY DEBRIDEMENT RIGHT KNEE (Right)  Patient Location: PACU  Anesthesia Type:General  Level of Consciousness: awake, sedated and patient cooperative  Airway & Oxygen Therapy: Patient Spontanous Breathing and Patient connected to face mask oxygen  Post-op Assessment: Report given to RN and Post -op Vital signs reviewed and stable  Post vital signs: Reviewed and stable  Last Vitals:  Filed Vitals:   09/21/14 0652  BP: 138/90  Pulse: 82  Temp: 36.9 C  Resp: 16    Complications: No apparent anesthesia complications

## 2014-09-21 NOTE — Op Note (Addendum)
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  Anesthesia Gen. +30 mL Marcaine knee block at end of the procedure  EBL minimal  Tourniquet: None  Procedure after induction general anesthesia standard prepping draping with DuraPrep no inbox given usual impervious stockinette Coban notes Sheets drapes posture applied.} Superolateral portal after timeout procedure medial lateral patellar patellar tendon portals are used for scope and probe placement. Patellofemoral joint showed grade 3 changes in the patellofemoral joint which was debrided with the shaver. Several loose small cartilage bodies were flowing the suprapatellar pouch which removed with the 4.2 shaver. Medial lateral gutters were clear anterior cruciate ligament was normal. Soft tissue debridement position was debrided over the top the anterior cruciate ligament for visualization. Anterior cruciate ligament PCL were normal. Medial meniscus carefully inspected and the posterior horn with the MRI scan showed the tear she revealed no tear. Posterior horn was intact no sublux and fragments. There was however considerable chondromalacia was some grade 3 and grade 4 changes which would plan with a 4.222 shaver. Lateral compartment showed some slight fraying along the edge of the body of the lateral meniscus anterior posterior horn was intact and there was only grade 1 changes in the lateral compartment. Returning to the medial compartment chondroplasty performed using a shaver but not down to bleeding bone. Grade 3 changes were smooth on both the tibial and femoral side. Patellofemoral joint was transmitted was clinically. Knee was thoroughly lavaged irrigated suctioned dry Marcaine was infiltrated  tincture benzoin Steri-Strips taking her 4 x 4's labral and Ace wrap 2 was applied the. Outpatient surgery is appropriate for treatment as an additional follow-up 1 week.

## 2014-09-21 NOTE — Discharge Instructions (Signed)
Keep knee elevated, ice on and off. Percocet as needed for pain.  See Dr. Lorin Mercy in one week    Post Anesthesia Home Care Instructions  Activity: Get plenty of rest for the remainder of the day. A responsible adult should stay with you for 24 hours following the procedure.  For the next 24 hours, DO NOT: -Drive a car -Paediatric nurse -Drink alcoholic beverages -Take any medication unless instructed by your physician -Make any legal decisions or sign important papers.  Meals: Start with liquid foods such as gelatin or soup. Progress to regular foods as tolerated. Avoid greasy, spicy, heavy foods. If nausea and/or vomiting occur, drink only clear liquids until the nausea and/or vomiting subsides. Call your physician if vomiting continues.  Special Instructions/Symptoms: Your throat may feel dry or sore from the anesthesia or the breathing tube placed in your throat during surgery. If this causes discomfort, gargle with warm salt water. The discomfort should disappear within 24 hours.  If you had a scopolamine patch placed behind your ear for the management of post- operative nausea and/or vomiting:  1. The medication in the patch is effective for 72 hours, after which it should be removed.  Wrap patch in a tissue and discard in the trash. Wash hands thoroughly with soap and water. 2. You may remove the patch earlier than 72 hours if you experience unpleasant side effects which may include dry mouth, dizziness or visual disturbances. 3. Avoid touching the patch. Wash your hands with soap and water after contact with the patch.

## 2014-09-21 NOTE — H&P (Signed)
Patricia Ramsey is an 49 y.o. female.   Chief Complaint: pain catching right knee with MRI positive posterior MM tear and radial tear of the lateral meniscus.  HPI: treated by Dr. Marily Memos with persistant pain and catching failed conservative tx with MRI post MM tear with continued mechanical symptoms  Past Medical History  Diagnosis Date  . Asthma   . Diabetes     boarderline, pt stopped Glucophage  . Hypertension     no meds  . Sleep apnea     has CPAP does not use    Past Surgical History  Procedure Laterality Date  . Tubal ligation    . Lithotripsy    . Oophrectomy    . Ankle surgery      History reviewed. No pertinent family history. Social History:  reports that she has never smoked. She does not have any smokeless tobacco history on file. She reports that she does not drink alcohol or use illicit drugs.  Allergies: No Known Allergies  Medications Prior to Admission  Medication Sig Dispense Refill  . albuterol (PROVENTIL HFA;VENTOLIN HFA) 108 (90 BASE) MCG/ACT inhaler Inhale 2 puffs into the lungs every 6 (six) hours as needed. For shortness of breath    . etodolac (LODINE) 400 MG tablet Take 400 mg by mouth 2 (two) times daily.    Marland Kitchen oxyCODONE-acetaminophen (PERCOCET/ROXICET) 5-325 MG per tablet Take 2 tablets by mouth every 4 (four) hours as needed. 6 tablet 0  . albuterol (PROVENTIL) (2.5 MG/3ML) 0.083% nebulizer solution Take 3 mLs (2.5 mg total) by nebulization every 6 (six) hours as needed for wheezing. 25 mL 1    No results found for this or any previous visit (from the past 48 hour(s)). No results found.  Review of Systems  Constitutional: Negative.   HENT: Negative.   Respiratory:       Asthma  Gastrointestinal:       Kidney stones  Musculoskeletal:       Ankle surgery in past  Skin: Negative.     Blood pressure 138/90, pulse 82, temperature 98.4 F (36.9 Ramsey), temperature source Oral, resp. rate 16, height 5\' 3"  (1.6 m), weight 113.399 kg (250  lb), SpO2 98 %. Physical Exam  Constitutional: She is oriented to person, place, and time. She appears well-developed and well-nourished.  HENT:  Head: Normocephalic.  Eyes: Pupils are equal, round, and reactive to light.  Neck: Normal range of motion.  Cardiovascular: Normal rate.   Respiratory: Effort normal.  GI: Bowel sounds are normal. There is no rebound and no guarding.  Musculoskeletal: She exhibits tenderness. She exhibits no edema.  Right knee MJLP, neg Lachman  Neurological: She is alert and oriented to person, place, and time.  Skin: Skin is warm and dry.  Psychiatric: She has a normal mood and affect. Her behavior is normal.     Assessment/Plan Right knee meniscal tear with mechanical symptoms failed conservative tx. For knee arthroscopy.  Patricia Ramsey 09/21/2014, 7:15 AM

## 2014-09-21 NOTE — Brief Op Note (Signed)
09/21/2014  8:28 AM  PATIENT:  Patricia Ramsey  49 y.o. female  PRE-OPERATIVE DIAGNOSIS:  Right Knee medial meniscal tear, bakers cyst, patellofemoral chondromalacia  POST-OPERATIVE DIAGNOSIS:  CHRONDROMALACIA RIGHT KNEE  PROCEDURE:  Procedure(s): CHONDROPLASTY DEBRIDEMENT RIGHT KNEE (Right)  SURGEON:  Surgeon(s) and Role:    * [name], MD - Primary  PHYSICIAN ASSISTANT:   ASSISTANTS: none   ANESTHESIA:   general  EBL:  Total I/O In: 1000 [I.V.:1000] Out: -   BLOOD ADMINISTERED:none  DRAINS: none   LOCAL MEDICATIONS USED:  MARCAINE     SPECIMEN:  No Specimen  DISPOSITION OF SPECIMEN:  N/A  COUNTS:  YES  TOURNIQUET:  * No tourniquets in log *  DICTATION: .Other Dictation: Dictation Number [id number]  PLAN OF CARE: Discharge to home after PACU  PATIENT DISPOSITION:  PACU - hemodynamically stable.   Delay start of Pharmacological VTE agent (>24hrs) due to surgical blood loss or risk of bleeding: not applicable

## 2014-09-21 NOTE — Anesthesia Postprocedure Evaluation (Signed)
  Anesthesia Post-op Note  Patient: Patricia Ramsey  Procedure(s) Performed: Procedure(s): CHONDROPLASTY DEBRIDEMENT RIGHT KNEE (Right)  Patient Location: PACU  Anesthesia Type: General   Level of Consciousness: awake, alert  and oriented  Airway and Oxygen Therapy: Patient Spontanous Breathing  Post-op Pain: mild  Post-op Assessment: Post-op Vital signs reviewed  Post-op Vital Signs: Reviewed  Last Vitals:  Filed Vitals:   09/21/14 0830  BP: 135/93  Pulse: 80  Temp:   Resp: 16    Complications: No apparent anesthesia complications

## 2014-09-21 NOTE — Anesthesia Preprocedure Evaluation (Signed)
Anesthesia Evaluation  Patient identified by MRN, date of birth, ID band Patient awake    Reviewed: Allergy & Precautions, NPO status , Patient's Chart, lab work & pertinent test results  Airway Mallampati: I  TM Distance: >3 FB Neck ROM: Full    Dental  (+) Teeth Intact, Dental Advisory Given   Pulmonary  breath sounds clear to auscultation        Cardiovascular hypertension, Pt. on medications Rhythm:Regular Rate:Normal     Neuro/Psych    GI/Hepatic   Endo/Other  diabetes, Well ControlledMorbid obesity  Renal/GU      Musculoskeletal   Abdominal   Peds  Hematology   Anesthesia Other Findings   Reproductive/Obstetrics                             Anesthesia Physical Anesthesia Plan  ASA: III  Anesthesia Plan: General   Post-op Pain Management:    Induction: Intravenous  Airway Management Planned: LMA  Additional Equipment:   Intra-op Plan:   Post-operative Plan: Extubation in OR  Informed Consent: I have reviewed the patients History and Physical, chart, labs and discussed the procedure including the risks, benefits and alternatives for the proposed anesthesia with the patient or authorized representative who has indicated his/her understanding and acceptance.   Dental advisory given  Plan Discussed with: CRNA, Anesthesiologist and Surgeon  Anesthesia Plan Comments:         Anesthesia Quick Evaluation  

## 2014-09-21 NOTE — Interval H&P Note (Signed)
History and Physical Interval Note:  09/21/2014 7:20 AM  Patricia Ramsey  has presented today for surgery, with the diagnosis of Right Knee medial meniscal tear, bakers cyst, patellofemoral chondromalacia  The various methods of treatment have been discussed with the patient and family. After consideration of risks, benefits and other options for treatment, the patient has consented to  Procedure(s): KNEE ARTHROSCOPY WITH PARTIAL MEDIAL MENISECTOMY (Right) as a surgical intervention .  The patient's history has been reviewed, patient examined, no change in status, stable for surgery.  I have reviewed the patient's chart and labs.  Questions were answered to the patient's satisfaction.     Dreanna Kyllo C

## 2014-09-21 NOTE — Anesthesia Procedure Notes (Signed)
Procedure Name: LMA Insertion Date/Time: 09/21/2014 7:33 AM Performed by: Lyndee Leo Pre-anesthesia Checklist: Patient identified, Emergency Drugs available, Suction available and Patient being monitored Patient Re-evaluated:Patient Re-evaluated prior to inductionOxygen Delivery Method: Circle System Utilized Preoxygenation: Pre-oxygenation with 100% oxygen Intubation Type: IV induction Ventilation: Mask ventilation without difficulty LMA: LMA with gastric port inserted LMA Size: 4.0 Number of attempts: 1 Placement Confirmation: positive ETCO2 Tube secured with: Tape Dental Injury: Teeth and Oropharynx as per pre-operative assessment

## 2014-09-24 ENCOUNTER — Encounter (HOSPITAL_BASED_OUTPATIENT_CLINIC_OR_DEPARTMENT_OTHER): Payer: Self-pay | Admitting: Orthopaedic Surgery

## 2014-09-26 ENCOUNTER — Encounter (HOSPITAL_BASED_OUTPATIENT_CLINIC_OR_DEPARTMENT_OTHER): Payer: Self-pay | Admitting: Orthopaedic Surgery

## 2014-10-08 ENCOUNTER — Inpatient Hospital Stay (HOSPITAL_COMMUNITY)
Admission: EM | Admit: 2014-10-08 | Discharge: 2014-10-13 | DRG: 175 | Disposition: A | Discharge status: Home / Self Care | Payer: Medicaid Other | Attending: Internal Medicine | Admitting: Internal Medicine

## 2014-10-08 ENCOUNTER — Encounter (HOSPITAL_COMMUNITY): Payer: Self-pay | Admitting: Emergency Medicine

## 2014-10-08 ENCOUNTER — Ambulatory Visit (HOSPITAL_BASED_OUTPATIENT_CLINIC_OR_DEPARTMENT_OTHER): Payer: Medicaid Other

## 2014-10-08 ENCOUNTER — Emergency Department (HOSPITAL_COMMUNITY): Payer: Medicaid Other

## 2014-10-08 DIAGNOSIS — I2699 Other pulmonary embolism without acute cor pulmonale: Secondary | ICD-10-CM | POA: Diagnosis present

## 2014-10-08 DIAGNOSIS — E119 Type 2 diabetes mellitus without complications: Secondary | ICD-10-CM | POA: Diagnosis present

## 2014-10-08 DIAGNOSIS — I2692 Saddle embolus of pulmonary artery without acute cor pulmonale: Principal | ICD-10-CM

## 2014-10-08 DIAGNOSIS — G4730 Sleep apnea, unspecified: Secondary | ICD-10-CM | POA: Diagnosis present

## 2014-10-08 DIAGNOSIS — R000 Tachycardia, unspecified: Secondary | ICD-10-CM | POA: Diagnosis present

## 2014-10-08 DIAGNOSIS — I824Z1 Acute embolism and thrombosis of unspecified deep veins of right distal lower extremity: Secondary | ICD-10-CM | POA: Diagnosis present

## 2014-10-08 DIAGNOSIS — R0781 Pleurodynia: Secondary | ICD-10-CM | POA: Diagnosis present

## 2014-10-08 DIAGNOSIS — Z79899 Other long term (current) drug therapy: Secondary | ICD-10-CM

## 2014-10-08 DIAGNOSIS — I82431 Acute embolism and thrombosis of right popliteal vein: Secondary | ICD-10-CM | POA: Diagnosis present

## 2014-10-08 DIAGNOSIS — J45901 Unspecified asthma with (acute) exacerbation: Secondary | ICD-10-CM | POA: Diagnosis present

## 2014-10-08 DIAGNOSIS — Z79891 Long term (current) use of opiate analgesic: Secondary | ICD-10-CM

## 2014-10-08 DIAGNOSIS — R51 Headache: Secondary | ICD-10-CM | POA: Diagnosis not present

## 2014-10-08 DIAGNOSIS — J9601 Acute respiratory failure with hypoxia: Secondary | ICD-10-CM | POA: Diagnosis present

## 2014-10-08 DIAGNOSIS — G4733 Obstructive sleep apnea (adult) (pediatric): Secondary | ICD-10-CM | POA: Diagnosis present

## 2014-10-08 DIAGNOSIS — J019 Acute sinusitis, unspecified: Secondary | ICD-10-CM | POA: Diagnosis present

## 2014-10-08 DIAGNOSIS — R079 Chest pain, unspecified: Secondary | ICD-10-CM | POA: Insufficient documentation

## 2014-10-08 DIAGNOSIS — I10 Essential (primary) hypertension: Secondary | ICD-10-CM | POA: Diagnosis present

## 2014-10-08 DIAGNOSIS — R519 Headache, unspecified: Secondary | ICD-10-CM

## 2014-10-08 DIAGNOSIS — K5900 Constipation, unspecified: Secondary | ICD-10-CM | POA: Diagnosis not present

## 2014-10-08 LAB — I-STAT TROPONIN, ED: Troponin i, poc: 0.02 ng/mL (ref 0.00–0.08)

## 2014-10-08 LAB — CBC
HCT: 39.9 % (ref 36.0–46.0)
Hemoglobin: 13.1 g/dL (ref 12.0–15.0)
MCH: 28.2 pg (ref 26.0–34.0)
MCHC: 32.8 g/dL (ref 30.0–36.0)
MCV: 85.8 fL (ref 78.0–100.0)
Platelets: 292 10*3/uL (ref 150–400)
RBC: 4.65 MIL/uL (ref 3.87–5.11)
RDW: 14.7 % (ref 11.5–15.5)
WBC: 12.1 10*3/uL — ABNORMAL HIGH (ref 4.0–10.5)

## 2014-10-08 LAB — BASIC METABOLIC PANEL
ANION GAP: 13 (ref 5–15)
BUN: 10 mg/dL (ref 6–20)
CHLORIDE: 104 mmol/L (ref 101–111)
CO2: 22 mmol/L (ref 22–32)
Calcium: 9.3 mg/dL (ref 8.9–10.3)
Creatinine, Ser: 0.87 mg/dL (ref 0.44–1.00)
GFR calc non Af Amer: >60 mL/min (ref >60)
Glucose, Bld: 99 mg/dL (ref 70–99)
POTASSIUM: 4 mmol/L (ref 3.5–5.1)
Sodium: 139 mmol/L (ref 135–145)

## 2014-10-08 MED ORDER — SODIUM CHLORIDE 0.9 % IV BOLUS (SEPSIS)
1000.0000 mL | Freq: Once | INTRAVENOUS | Status: AC
  Administered 2014-10-08: 1000 mL via INTRAVENOUS

## 2014-10-08 MED ORDER — ALBUTEROL SULFATE (2.5 MG/3ML) 0.083% IN NEBU
5.0000 mg | INHALATION_SOLUTION | Freq: Once | RESPIRATORY_TRACT | Status: AC
  Administered 2014-10-08: 5 mg via RESPIRATORY_TRACT
  Filled 2014-10-08: qty 6

## 2014-10-08 MED ORDER — ENOXAPARIN SODIUM 120 MG/0.8ML ~~LOC~~ SOLN
1.0000 mg/kg | SUBCUTANEOUS | Status: AC
Start: 2014-10-08 — End: 2014-10-09
  Administered 2014-10-09: 105 mg via SUBCUTANEOUS
  Filled 2014-10-08: qty 0.8

## 2014-10-08 MED ORDER — OXYCODONE-ACETAMINOPHEN 5-325 MG PO TABS
1.0000 | ORAL_TABLET | Freq: Once | ORAL | Status: AC
  Administered 2014-10-08: 1 via ORAL
  Filled 2014-10-08: qty 1

## 2014-10-08 NOTE — ED Provider Notes (Addendum)
CSN: [id number]     Arrival date & time 10/08/14  2012 History   First MD Initiated Contact with Patient 10/08/14 2306     Chief Complaint  Patient presents with  . Chest Pain     Patient is a 49 y.o. female presenting with chest pain. The history is provided by the patient. No language interpreter was used.  Chest Pain  [name] presents for evaluation of chest pain and SOB.  She reports feeling poorly for the last two days with CP and SOB.  She was at a sleep apnea study and referred to the ED for further eval.  She has had two days of cough with post-tussive emesis.  No fevers.  She has left sided chest pain and back pain that is worse with coughing and deep breaths.  She reports right leg swelling that has been present since she had surgery on her right knee a few weeks ago. She has a hx/o borderline HTN, borderline DM, and asthma.  Sxs do not seem to respond well to inhalers/nebulizers.  She has no hx/o DVT but her mother does.  Sxs are moderate, constant, worsening.    Past Medical History  Diagnosis Date  . Asthma   . Diabetes     boarderline, pt stopped Glucophage  . Hypertension     no meds  . Sleep apnea     has CPAP does not use   Past Surgical History  Procedure Laterality Date  . Tubal ligation    . Lithotripsy    . Oophrectomy    . Ankle surgery    . Knee arthroscopy Right 09/21/2014    Procedure:  right knee arthroscopy;  Surgeon: [name], MD;  Location: [facility];  Service: Orthopedics;  Laterality: Right;  . Chondroplasty Right 09/21/2014    Procedure: CHONDROPLASTY right knee;  Surgeon: [name], MD;  Location: [facility];  Service: Orthopedics;  Laterality: Right;   No family history on file. History  Substance Use Topics  . Smoking status: Never Smoker   . Smokeless tobacco: Not on file  . Alcohol Use: No   OB History    No data available     Review of Systems  Cardiovascular: Positive for chest pain.  All other  systems reviewed and are negative.     Allergies  Review of patient's allergies indicates no known allergies.  Home Medications   Prior to Admission medications   Medication Sig Start Date End Date Taking? Authorizing Provider  albuterol (PROVENTIL HFA;VENTOLIN HFA) 108 (90 BASE) MCG/ACT inhaler Inhale 2 puffs into the lungs every 6 (six) hours as needed. For shortness of breath   Yes Historical Provider, MD  albuterol (PROVENTIL) (2.5 MG/3ML) 0.083% nebulizer solution Take 3 mLs (2.5 mg total) by nebulization every 6 (six) hours as needed for wheezing. 06/05/12  Yes Veryl Speak, MD  METFORMIN HCL PO Take 1 tablet by mouth once.   Yes Historical Provider, MD  oxyCODONE-acetaminophen (ROXICET) 5-325 MG per tablet Take 2 tablets by mouth every 4 (four) hours as needed for severe pain. 09/21/14  Yes [name], MD  oxyCODONE-acetaminophen (PERCOCET/ROXICET) 5-325 MG per tablet Take 2 tablets by mouth every 4 (four) hours as needed. Patient not taking: Reported on 10/08/2014 02/24/14   Tanna Furry, MD   BP 108/72 mmHg  Pulse 123  Temp(Src) 99.8 F (37.7 C) (Oral)  Resp 23  Ht 5\' 3"  (1.6 m)  Wt 230 lb (104.327 kg)  BMI 40.75 kg/m2  SpO2 97% Physical Exam  Constitutional: She is oriented to person, place, and time. She appears well-developed and well-nourished.  HENT:  Head: Normocephalic and atraumatic.  Cardiovascular: Regular rhythm.   No murmur heard. tachycardic  Pulmonary/Chest: No respiratory distress.  Tachypnea with good air movement bilaterally, no wheezing. Lung exam post nebulizer  Abdominal: Soft. There is no tenderness. There is no rebound and no guarding.  Musculoskeletal: She exhibits no tenderness.  1+ pitting edema of RLE  Neurological: She is alert and oriented to person, place, and time.  Skin: Skin is warm and dry.  Psychiatric: She has a normal mood and affect. Her behavior is normal.  Nursing note and vitals reviewed.   ED Course  Procedures (including  critical care time) CRITICAL CARE Performed by: Quintella Reichert   Total critical care time: 30 minutes  Critical care time was exclusive of separately billable procedures and treating other patients.  Critical care was necessary to treat or prevent imminent or life-threatening deterioration.  Critical care was time spent personally by me on the following activities: development of treatment plan with patient and/or surrogate as well as nursing, discussions with consultants, evaluation of patient's response to treatment, examination of patient, obtaining history from patient or surrogate, ordering and performing treatments and interventions, ordering and review of laboratory studies, ordering and review of radiographic studies, pulse oximetry and re-evaluation of patient's condition.  Labs Review Labs Reviewed  CBC - Abnormal; Notable for the following:    WBC 12.1 (*)    All other components within normal limits  BASIC METABOLIC PANEL  BRAIN NATRIURETIC PEPTIDE  I-STAT TROPOININ, ED    Imaging Review Dg Chest 2 View  10/08/2014   CLINICAL DATA:  Left chest pain radiating to the left upper back. Shortness of breath. Dry cough. Wheezing and chest congestion.  EXAM: CHEST  2 VIEW  COMPARISON:  08/29/2013.  FINDINGS: Normal sized heart. Interval small amount of linear density in the right middle lobe and lingula. Otherwise, clear lungs. Minimal thoracic spine degenerative changes.  IMPRESSION: Minimal linear atelectasis or scarring in the right middle lobe and lingula. Otherwise, unremarkable examination.   Electronically Signed   By: Claudie Revering M.D.   On: 10/08/2014 21:18     EKG Interpretation   Date/Time:  Monday Oct 08 2014 20:21:43 EDT Ventricular Rate:  131 PR Interval:  122 QRS Duration: 72 QT Interval:  308 QTC Calculation: 220 R Axis:   27 Text Interpretation:  Sinus tachycardia Low voltage QRS Cannot rule out  Anterior infarct , age undetermined Abnormal ECG Confirmed by  Hazle Coca  [phone]) on 10/08/2014 11:06:02 PM      MDM   Final diagnoses:  Saddle embolism of pulmonary artery    Pt here for CP, SOB, tachycardia following recent knee surgery.  Initial exam concerning for PE - pt started on lovenox.  CT scan with saddle PE.  D/w pt findings of study and need for admission for further mgmt.  Discussed with hospitalist regarding admission.      Quintella Reichert, MD 10/09/14 0155  Activated "code PE" per Radiology recommendations.  D/w Dr. Jimmy Footman - does not recommend thrombolytics or ICU at this time.    Quintella Reichert, MD 10/09/14 [phone]

## 2014-10-08 NOTE — ED Notes (Addendum)
Pt. reports left chest pain radiating to left upper back with SOB , dry cough / wheezing/chest congestion and emesis onset this week .

## 2014-10-09 ENCOUNTER — Emergency Department (HOSPITAL_COMMUNITY): Payer: Medicaid Other

## 2014-10-09 ENCOUNTER — Encounter (HOSPITAL_COMMUNITY): Payer: Self-pay | Admitting: Radiology

## 2014-10-09 ENCOUNTER — Inpatient Hospital Stay (HOSPITAL_COMMUNITY): Payer: Medicaid Other

## 2014-10-09 ENCOUNTER — Ambulatory Visit (HOSPITAL_COMMUNITY): Payer: Medicaid Other

## 2014-10-09 DIAGNOSIS — R0789 Other chest pain: Secondary | ICD-10-CM

## 2014-10-09 DIAGNOSIS — R071 Chest pain on breathing: Secondary | ICD-10-CM | POA: Diagnosis not present

## 2014-10-09 DIAGNOSIS — Z79899 Other long term (current) drug therapy: Secondary | ICD-10-CM | POA: Diagnosis not present

## 2014-10-09 DIAGNOSIS — G4733 Obstructive sleep apnea (adult) (pediatric): Secondary | ICD-10-CM | POA: Diagnosis not present

## 2014-10-09 DIAGNOSIS — I2692 Saddle embolus of pulmonary artery without acute cor pulmonale: Secondary | ICD-10-CM | POA: Diagnosis present

## 2014-10-09 DIAGNOSIS — Z79891 Long term (current) use of opiate analgesic: Secondary | ICD-10-CM | POA: Diagnosis not present

## 2014-10-09 DIAGNOSIS — R079 Chest pain, unspecified: Secondary | ICD-10-CM | POA: Insufficient documentation

## 2014-10-09 DIAGNOSIS — I10 Essential (primary) hypertension: Secondary | ICD-10-CM

## 2014-10-09 DIAGNOSIS — J45901 Unspecified asthma with (acute) exacerbation: Secondary | ICD-10-CM | POA: Diagnosis not present

## 2014-10-09 DIAGNOSIS — I2699 Other pulmonary embolism without acute cor pulmonale: Secondary | ICD-10-CM

## 2014-10-09 DIAGNOSIS — E119 Type 2 diabetes mellitus without complications: Secondary | ICD-10-CM | POA: Diagnosis present

## 2014-10-09 DIAGNOSIS — G4730 Sleep apnea, unspecified: Secondary | ICD-10-CM | POA: Diagnosis present

## 2014-10-09 DIAGNOSIS — R000 Tachycardia, unspecified: Secondary | ICD-10-CM | POA: Diagnosis present

## 2014-10-09 DIAGNOSIS — J9601 Acute respiratory failure with hypoxia: Secondary | ICD-10-CM | POA: Diagnosis present

## 2014-10-09 DIAGNOSIS — R0781 Pleurodynia: Secondary | ICD-10-CM | POA: Diagnosis present

## 2014-10-09 DIAGNOSIS — J019 Acute sinusitis, unspecified: Secondary | ICD-10-CM | POA: Diagnosis present

## 2014-10-09 DIAGNOSIS — R51 Headache: Secondary | ICD-10-CM | POA: Diagnosis not present

## 2014-10-09 DIAGNOSIS — I824Z1 Acute embolism and thrombosis of unspecified deep veins of right distal lower extremity: Secondary | ICD-10-CM | POA: Diagnosis present

## 2014-10-09 DIAGNOSIS — K5900 Constipation, unspecified: Secondary | ICD-10-CM | POA: Diagnosis not present

## 2014-10-09 DIAGNOSIS — J9621 Acute and chronic respiratory failure with hypoxia: Secondary | ICD-10-CM | POA: Diagnosis not present

## 2014-10-09 DIAGNOSIS — I82431 Acute embolism and thrombosis of right popliteal vein: Secondary | ICD-10-CM | POA: Diagnosis present

## 2014-10-09 LAB — MRSA PCR SCREENING: MRSA by PCR: NEGATIVE

## 2014-10-09 LAB — GLUCOSE, CAPILLARY
Glucose-Capillary: 77 mg/dL (ref 70–99)
Glucose-Capillary: 91 mg/dL (ref 70–99)
Glucose-Capillary: 99 mg/dL (ref 70–99)

## 2014-10-09 LAB — PROTIME-INR
INR: 1.15 (ref 0.00–1.49)
Prothrombin Time: 14.9 seconds (ref 11.6–15.2)

## 2014-10-09 LAB — BRAIN NATRIURETIC PEPTIDE: B Natriuretic Peptide: 104.4 pg/mL — ABNORMAL HIGH (ref 0.0–100.0)

## 2014-10-09 MED ORDER — ENOXAPARIN SODIUM 120 MG/0.8ML ~~LOC~~ SOLN
1.0000 mg/kg | Freq: Once | SUBCUTANEOUS | Status: AC
  Administered 2014-10-09: 115 mg via SUBCUTANEOUS
  Filled 2014-10-09: qty 0.8

## 2014-10-09 MED ORDER — ONDANSETRON HCL 4 MG/2ML IJ SOLN
4.0000 mg | Freq: Four times a day (QID) | INTRAMUSCULAR | Status: DC | PRN
  Administered 2014-10-10: 4 mg via INTRAVENOUS
  Filled 2014-10-09: qty 2

## 2014-10-09 MED ORDER — CETYLPYRIDINIUM CHLORIDE 0.05 % MT LIQD
7.0000 mL | Freq: Two times a day (BID) | OROMUCOSAL | Status: DC
  Administered 2014-10-09 – 2014-10-13 (×8): 7 mL via OROMUCOSAL

## 2014-10-09 MED ORDER — IOHEXOL 350 MG/ML SOLN
80.0000 mL | Freq: Once | INTRAVENOUS | Status: AC | PRN
  Administered 2014-10-09: 80 mL via INTRAVENOUS

## 2014-10-09 MED ORDER — SODIUM CHLORIDE 0.9 % IJ SOLN
3.0000 mL | Freq: Two times a day (BID) | INTRAMUSCULAR | Status: DC
  Administered 2014-10-09 – 2014-10-13 (×9): 3 mL via INTRAVENOUS

## 2014-10-09 MED ORDER — INSULIN ASPART 100 UNIT/ML ~~LOC~~ SOLN: 0.0000 [IU] | Freq: Every day | SUBCUTANEOUS | Status: DC

## 2014-10-09 MED ORDER — OXYCODONE-ACETAMINOPHEN 5-325 MG PO TABS
1.0000 | ORAL_TABLET | ORAL | Status: DC | PRN
  Administered 2014-10-09: 1 via ORAL
  Administered 2014-10-09: 2 via ORAL
  Administered 2014-10-09 – 2014-10-10 (×3): 1 via ORAL
  Administered 2014-10-10 (×2): 2 via ORAL
  Filled 2014-10-09 (×2): qty 1
  Filled 2014-10-09: qty 2
  Filled 2014-10-09: qty 1
  Filled 2014-10-09: qty 2
  Filled 2014-10-09: qty 1
  Filled 2014-10-09: qty 2

## 2014-10-09 MED ORDER — DOCUSATE SODIUM 100 MG PO CAPS
100.0000 mg | ORAL_CAPSULE | Freq: Two times a day (BID) | ORAL | Status: DC
  Administered 2014-10-09 – 2014-10-13 (×8): 100 mg via ORAL
  Filled 2014-10-09 (×11): qty 1

## 2014-10-09 MED ORDER — ENOXAPARIN SODIUM 120 MG/0.8ML ~~LOC~~ SOLN
1.0000 mg/kg | Freq: Two times a day (BID) | SUBCUTANEOUS | Status: DC
  Administered 2014-10-09 – 2014-10-10 (×3): 115 mg via SUBCUTANEOUS
  Filled 2014-10-09 (×5): qty 0.8

## 2014-10-09 MED ORDER — ONDANSETRON HCL 4 MG PO TABS: 4.0000 mg | ORAL_TABLET | Freq: Four times a day (QID) | ORAL | Status: DC | PRN

## 2014-10-09 MED ORDER — INSULIN ASPART 100 UNIT/ML ~~LOC~~ SOLN: 0.0000 [IU] | Freq: Three times a day (TID) | SUBCUTANEOUS | Status: DC

## 2014-10-09 MED ORDER — POLYETHYLENE GLYCOL 3350 17 G PO PACK
17.0000 g | PACK | Freq: Every day | ORAL | Status: DC | PRN
  Administered 2014-10-09 – 2014-10-10 (×2): 17 g via ORAL
  Filled 2014-10-09 (×3): qty 1

## 2014-10-09 MED ORDER — WARFARIN VIDEO
Freq: Once | Status: AC
  Administered 2014-10-09: 12:00:00

## 2014-10-09 MED ORDER — ENOXAPARIN SODIUM 120 MG/0.8ML ~~LOC~~ SOLN
105.0000 mg | Freq: Two times a day (BID) | SUBCUTANEOUS | Status: DC
  Filled 2014-10-09 (×2): qty 0.8

## 2014-10-09 MED ORDER — WARFARIN - PHARMACIST DOSING INPATIENT
Freq: Every day | Status: DC
  Administered 2014-10-09: 18:00:00

## 2014-10-09 MED ORDER — ENOXAPARIN SODIUM 120 MG/0.8ML ~~LOC~~ SOLN: 1.0000 mg/kg | Freq: Two times a day (BID) | SUBCUTANEOUS | Status: DC

## 2014-10-09 MED ORDER — BENZONATATE 100 MG PO CAPS
100.0000 mg | ORAL_CAPSULE | Freq: Three times a day (TID) | ORAL | Status: DC
  Administered 2014-10-09 – 2014-10-13 (×13): 100 mg via ORAL
  Filled 2014-10-09 (×18): qty 1

## 2014-10-09 MED ORDER — SODIUM CHLORIDE 0.9 % IV SOLN
INTRAVENOUS | Status: DC
  Administered 2014-10-09 (×2): via INTRAVENOUS

## 2014-10-09 MED ORDER — HYDROMORPHONE HCL 1 MG/ML IJ SOLN
0.5000 mg | INTRAMUSCULAR | Status: DC | PRN
Start: 2014-10-09 — End: 2014-10-13
  Administered 2014-10-09 – 2014-10-12 (×8): 0.5 mg via INTRAVENOUS
  Filled 2014-10-09 (×8): qty 1

## 2014-10-09 MED ORDER — COUMADIN BOOK
Freq: Once | Status: AC
  Filled 2014-10-09: qty 1

## 2014-10-09 MED ORDER — WARFARIN SODIUM 10 MG PO TABS
10.0000 mg | ORAL_TABLET | Freq: Once | ORAL | Status: AC
  Administered 2014-10-09: 10 mg via ORAL
  Filled 2014-10-09: qty 1

## 2014-10-09 NOTE — Progress Notes (Signed)
Pharmacy bedside at this time with coumadin education. Family at bedside as well.

## 2014-10-09 NOTE — H&P (Signed)
[facility] History and Physical  Patricia Ramsey NLG:[id number] DOB: 1965-08-03    PCP:   Patricia Peers, MD   Chief Complaint: DOE.   HPI: Patricia Ramsey is an 49 y.o. female with hx of DM, asthma, HTN on no meds, sleep apnea, recent right knee surgery (09/21/14) presented to the sleep study, but unable to perform the polysomnogram as she was having shortness of breath, sent over to the ER.  She was found to have a saddle embolism, with moderate to severe clot burden, and right ventricular strain by CT.  She had one episode of hypotension, but it may have been an error.  The rest of her BP was 120-130, and she her oxygen saturation was 97 per cent.  She had no fever, chills, nausea, vomiting or any other symptomology.  PCCM was consulted, and did not think she was a candidate for thrombolytic therapy.  Her EKG showed sinus tachycardia.  Her Cr was normal and her HB and platelet count was normal.  Hospalist was asked to admit her for acute PE>   Rewiew of Systems:  Constitutional: Negative for malaise, fever and chills. No significant weight loss or weight gain Eyes: Negative for eye pain, redness and discharge, diplopia, visual changes, or flashes of light. ENMT: Negative for ear pain, hoarseness, nasal congestion, sinus pressure and sore throat. No headaches; tinnitus, drooling, or problem swallowing. Cardiovascular: Negative for chest pain, palpitations, diaphoresis,  and peripheral edema. ; No orthopnea, PND Respiratory: Negative for cough, hemoptysis, wheezing and stridor. No pleuritic chestpain. Gastrointestinal: Negative for nausea, vomiting, diarrhea, constipation, abdominal pain, melena, blood in stool, hematemesis, jaundice and rectal bleeding.    Genitourinary: Negative for frequency, dysuria, incontinence,flank pain and hematuria; Musculoskeletal: Negative for back pain and neck pain. Negative for swelling and trauma.;  Skin: . Negative for pruritus, rash, abrasions,  bruising and skin lesion.; ulcerations Neuro: Negative for headache, lightheadedness and neck stiffness. Negative for weakness, altered level of consciousness , altered mental status, extremity weakness, burning feet, involuntary movement, seizure and syncope.  Psych: negative for anxiety, depression, insomnia, tearfulness, panic attacks, hallucinations, paranoia, suicidal or homicidal ideation   Past Medical History  Diagnosis Date  . Asthma   . Diabetes     boarderline, pt stopped Glucophage  . Hypertension     no meds  . Sleep apnea     has CPAP does not use    Past Surgical History  Procedure Laterality Date  . Tubal ligation    . Lithotripsy    . Oophrectomy    . Ankle surgery    . Knee arthroscopy Right 09/21/2014    Procedure:  right knee arthroscopy;  Surgeon: Patricia Killings, MD;  Location: [facility];  Service: Orthopedics;  Laterality: Right;  . Chondroplasty Right 09/21/2014    Procedure: CHONDROPLASTY right knee;  Surgeon: Patricia Killings, MD;  Location: [facility];  Service: Orthopedics;  Laterality: Right;    Medications:  HOME MEDS: Prior to Admission medications   Medication Sig Start Date End Date Taking? Authorizing Provider  albuterol (PROVENTIL HFA;VENTOLIN HFA) 108 (90 BASE) MCG/ACT inhaler Inhale 2 puffs into the lungs every 6 (six) hours as needed. For shortness of breath   Yes Historical Provider, MD  albuterol (PROVENTIL) (2.5 MG/3ML) 0.083% nebulizer solution Take 3 mLs (2.5 mg total) by nebulization every 6 (six) hours as needed for wheezing. 06/05/12  Yes Patricia Speak, MD  METFORMIN HCL PO Take 1 tablet by mouth once.  Yes Historical Provider, MD  oxyCODONE-acetaminophen (ROXICET) 5-325 MG per tablet Take 2 tablets by mouth every 4 (four) hours as needed for severe pain. 09/21/14  Yes Patricia Killings, MD  oxyCODONE-acetaminophen (PERCOCET/ROXICET) 5-325 MG per tablet Take 2 tablets by mouth every 4 (four) hours as needed. Patient not  taking: Reported on 10/08/2014 02/24/14   Patricia Furry, MD     Allergies:  No Known Allergies  Social History:   reports that she has never smoked. She does not have any smokeless tobacco history on file. She reports that she does not drink alcohol or use illicit drugs.  Family History: History reviewed. No pertinent family history.   Physical Exam: Filed Vitals:   10/09/14 0230 10/09/14 0245 10/09/14 0300 10/09/14 0315  BP: 120/88 133/83 128/74 117/64  Pulse: 109 109 107 109  Temp:      TempSrc:      Resp: 22 20 29 28   Height:      Weight:      SpO2: 96% 96% 95% 94%   Blood pressure 117/64, pulse 109, temperature 99.8 F (37.7 C), temperature source Oral, resp. rate 28, height 5\' 3"  (1.6 m), weight 104.327 kg (230 lb), SpO2 94 %.  GEN:  Pleasant patient lying in the stretcher in no acute distress; cooperative with exam. PSYCH:  alert and oriented x4; does not appear anxious or depressed; affect is appropriate. HEENT: Mucous membranes pink and anicteric; PERRLA; EOM intact; no cervical lymphadenopathy nor thyromegaly or carotid bruit; no JVD; There were no stridor. Neck is very supple. Breasts:: Not examined CHEST WALL: No tenderness CHEST: Normal respiration, clear to auscultation bilaterally.  HEART: Regular rate and rhythm.  There are no murmur, rub, or gallops.   BACK: No kyphosis or scoliosis; no CVA tenderness ABDOMEN: soft and non-tender; no masses, no organomegaly, normal abdominal bowel sounds; no pannus; no intertriginous candida. There is no rebound and no distention. Rectal Exam: Not done EXTREMITIES: No bone or joint deformity; age-appropriate arthropathy of the hands and knees; no edema; no ulcerations.  There is no calf tenderness. Genitalia: not examined PULSES: 2+ and symmetric SKIN: Normal hydration no rash or ulceration CNS: Cranial nerves 2-12 grossly intact no focal lateralizing neurologic deficit.  Speech is fluent; uvula elevated with phonation, facial  symmetry and tongue midline. DTR are normal bilaterally, cerebella exam is intact, barbinski is negative and strengths are equaled bilaterally.  No sensory loss.   Labs on Admission:  Basic Metabolic Panel:  Recent Labs Lab 10/08/14 2031  NA 139  K 4.0  CL 104  CO2 22  GLUCOSE 99  BUN 10  CREATININE 0.87  CALCIUM 9.3   Liver Function Tests: CBC:  Recent Labs Lab 10/08/14 2031  WBC 12.1*  HGB 13.1  HCT 39.9  MCV 85.8  PLT 292    Radiological Exams on Admission: Dg Chest 2 View  10/08/2014   CLINICAL DATA:  Left chest pain radiating to the left upper back. Shortness of breath. Dry cough. Wheezing and chest congestion.  EXAM: CHEST  2 VIEW  COMPARISON:  08/29/2013.  FINDINGS: Normal sized heart. Interval small amount of linear density in the right middle lobe and lingula. Otherwise, clear lungs. Minimal thoracic spine degenerative changes.  IMPRESSION: Minimal linear atelectasis or scarring in the right middle lobe and lingula. Otherwise, unremarkable examination.   Electronically Signed   By: Claudie Revering M.D.   On: 10/08/2014 21:18   Ct Angio Chest Pe W/cm &/or Wo Cm  10/09/2014  CLINICAL DATA:  Left chest pain radiating to the left upper back. Shortness of breath. Dry cough. Wheezing and chest congestion. Onset this week.  EXAM: CT ANGIOGRAPHY CHEST WITH CONTRAST  TECHNIQUE: Multidetector CT imaging of the chest was performed using the standard protocol during bolus administration of intravenous contrast. Multiplanar CT image reconstructions and MIPs were obtained to evaluate the vascular anatomy.  CONTRAST:  68mL OMNIPAQUE IOHEXOL 350 MG/ML SOLN  COMPARISON:  Chest radiograph 10/08/2014  FINDINGS: Technically adequate study with good opacification of the central and segmental pulmonary arteries. Motion artifact is present. Multiple filling defects are demonstrated in the central, lobar, and segmental pulmonary arteries bilaterally representing large bilateral pulmonary emboli with  significant clot burden present. The RV to LV ratio calculates to about 2, indicating increased risk of right heart strain.  Normal caliber thoracic aorta. No significant lymphadenopathy in the chest. Esophagus is decompressed.  Evaluation of lungs is limited due to respiratory motion artifact. No discrete consolidation identified. Visualized airways appear patent. No pleural effusions. No pneumothorax.  Included portions of the upper abdominal organs are grossly unremarkable. Degenerative changes in the spine.  Review of the MIP images confirms the above findings.  IMPRESSION: Large central and segmental pulmonary emboli bilaterally. Positive for acute PE with CT evidence of right heart strain (RV/LV Ratio = 2) consistent with at least submassive (intermediate risk) PE. The presence of right heart strain has been associated with an increased risk of morbidity and mortality. Please activate Code PE by paging [phone].  These results were called by telephone at the time of interpretation on 10/09/2014 at 1:49 am to Dr. Quintella Reichert , who verbally acknowledged these results.   Electronically Signed   By: Lucienne Capers M.D.   On: 10/09/2014 01:51    EKG: Independently reviewed. ST with no acute ST T changes.    Assessment/Plan Present on Admission:  . Acute pulmonary embolism . HTN (hypertension) . Sleep apnea in adult  PLAN:  Will admit her to SDU, given the severity of the central embolism bilaterally and clot burden.  She was given Lovenox in the ER, and we will continue with it.  I discussed Coumadin, NOAC, and all the risks and benefits, and we will bridge her with Lovenox and Coumadin.  She appears well and is not in any distress.  She did not require supplemental oxygen.  I have continue her home meds, and give pain meds.  Will obtain ECHO, along with doppler US of her extremities.  She is stable, full code, and will be admitted to [facility] service.  Thank you for allowing me to participate in her  care.   Other plans as per orders.  Code Status: FULL Haskel Khan, MD. [facility] Pager [phone] 7pm to 7am.  10/09/2014, 3:32 AM

## 2014-10-09 NOTE — Progress Notes (Addendum)
[facility]  [facility] Progress Note   Patricia Ramsey BPZ:[id number] DOB: 04-04-1966 DOA: 10/08/2014 PCP: Elyn Peers, MD  Brief narrative: Patricia Ramsey is a 49 y.o. female  with PMH of Asthma, DM, HTN, OSA who underwent a right knee arthroscopy and chondroplasty on 4/22 by Dr Lorin Mercy and has since had swelling in her right leg. She presented to the ER with a complaints of chest pain and shortness of breath. CTA performed in the ER revealed evidence of b/l large central and segmental pulmonary emboli with right heart strain. She admits that she has not been very mobile since her knee surgery and has essentially been either sitting in a chair laying in bed for a good 2 weeks.   Subjective: Complains of pain in the right lower lobe rib cage. Has a dry cough. No complaints of shortness of breath at rest no nausea vomiting diarrhea or constipation.  Assessment/Plan: Principal Problem:   Acute pulmonary embolism/ Hypoxia/sinus tachycardia -Likely secondary to right lower extremity DVT as she has had right lower extremity swelling after right knee surgery-obtain venous ultrasound-continue Lovenox-patient has chosen to start Coumadin which will be started today -Currently requiring 2 L of oxygen -CT suggestive of right heart strain-Case was discussed with pulmonary / critical care  in the ER who did not feel the patient needed thrombolytics -2-D echo pending  Active Problems:  Left lower chest pain -Worse when coughing -Likely pleuritic chest pain in relation to acute PE -PRN oxycodone/acetaminophen  DM 2 - hold Metformin due to CT with contrast- start sliding scale insulin  Asthma -Per patient, her asthma is usually well controlled-to wheezing at this time  Code Status: Full code Family Communication:  Disposition Plan: Home when stable DVT prophylaxis: Lovenox Consultants: Procedures:  Antibiotics: Anti-infectives    None      Objective: Filed Weights    10/08/14 2025 10/09/14 0400  Weight: 104.327 kg (230 lb) 112.4 kg (247 lb 12.8 oz)    Intake/Output Summary (Last 24 hours) at 10/09/14 1216 Last data filed at 10/09/14 1000  Gross per 24 hour  Intake 208.75 ml  Output    600 ml  Net -391.25 ml     Vitals Filed Vitals:   10/09/14 0805 10/09/14 0900 10/09/14 1000 10/09/14 1100  BP: 106/62 124/85 120/94 125/81  Pulse:      Temp:    98.5 F (36.9 C)  TempSrc:    Oral  Resp: 32 26 20 23   Height:      Weight:      SpO2: 95% 96% 97% 99%    Exam:  General:  Pt is alert, not in acute distress  HEENT: No icterus, No thrush  Cardiovascular: regular rate and rhythm, S1/S2 No murmur  Respiratory: clear to auscultation bilaterally   Abdomen: Soft, +Bowel sounds, non tender, non distended, no guarding  MSK: No LE edema, cyanosis or clubbing  Data Reviewed: Basic Metabolic Panel:  Recent Labs Lab 10/08/14 2031  NA 139  K 4.0  CL 104  CO2 22  GLUCOSE 99  BUN 10  CREATININE 0.87  CALCIUM 9.3   Liver Function Tests: No results for input(s): AST, ALT, ALKPHOS, BILITOT, PROT, ALBUMIN in the last 168 hours. No results for input(s): LIPASE, AMYLASE in the last 168 hours. No results for input(s): AMMONIA in the last 168 hours. CBC:  Recent Labs Lab 10/08/14 2031  WBC 12.1*  HGB 13.1  HCT 39.9  MCV 85.8  PLT 292   Cardiac Enzymes:  No results for input(s): CKTOTAL, CKMB, CKMBINDEX, TROPONINI in the last 168 hours. BNP (last 3 results)  Recent Labs  10/09/14 0641  BNP 104.4*    ProBNP (last 3 results) No results for input(s): PROBNP in the last 8760 hours.  CBG: No results for input(s): GLUCAP in the last 168 hours.  Recent Results (from the past 240 hour(s))  MRSA PCR Screening     Status: None   Collection Time: 10/09/14  4:05 AM  Result Value Ref Range Status   MRSA by PCR NEGATIVE NEGATIVE Final    Comment:        The GeneXpert MRSA Assay (FDA approved for NASAL specimens only), is one  component of a comprehensive MRSA colonization surveillance program. It is not intended to diagnose MRSA infection nor to guide or monitor treatment for MRSA infections.      Studies: Dg Chest 2 View  10/08/2014   CLINICAL DATA:  Left chest pain radiating to the left upper back. Shortness of breath. Dry cough. Wheezing and chest congestion.  EXAM: CHEST  2 VIEW  COMPARISON:  08/29/2013.  FINDINGS: Normal sized heart. Interval small amount of linear density in the right middle lobe and lingula. Otherwise, clear lungs. Minimal thoracic spine degenerative changes.  IMPRESSION: Minimal linear atelectasis or scarring in the right middle lobe and lingula. Otherwise, unremarkable examination.   Electronically Signed   By: Claudie Revering M.D.   On: 10/08/2014 21:18   Ct Angio Chest Pe W/cm &/or Wo Cm  10/09/2014   CLINICAL DATA:  Left chest pain radiating to the left upper back. Shortness of breath. Dry cough. Wheezing and chest congestion. Onset this week.  EXAM: CT ANGIOGRAPHY CHEST WITH CONTRAST  TECHNIQUE: Multidetector CT imaging of the chest was performed using the standard protocol during bolus administration of intravenous contrast. Multiplanar CT image reconstructions and MIPs were obtained to evaluate the vascular anatomy.  CONTRAST:  64mL OMNIPAQUE IOHEXOL 350 MG/ML SOLN  COMPARISON:  Chest radiograph 10/08/2014  FINDINGS: Technically adequate study with good opacification of the central and segmental pulmonary arteries. Motion artifact is present. Multiple filling defects are demonstrated in the central, lobar, and segmental pulmonary arteries bilaterally representing large bilateral pulmonary emboli with significant clot burden present. The RV to LV ratio calculates to about 2, indicating increased risk of right heart strain.  Normal caliber thoracic aorta. No significant lymphadenopathy in the chest. Esophagus is decompressed.  Evaluation of lungs is limited due to respiratory motion artifact. No  discrete consolidation identified. Visualized airways appear patent. No pleural effusions. No pneumothorax.  Included portions of the upper abdominal organs are grossly unremarkable. Degenerative changes in the spine.  Review of the MIP images confirms the above findings.  IMPRESSION: Large central and segmental pulmonary emboli bilaterally. Positive for acute PE with CT evidence of right heart strain (RV/LV Ratio = 2) consistent with at least submassive (intermediate risk) PE. The presence of right heart strain has been associated with an increased risk of morbidity and mortality. Please activate Code PE by paging [phone].  These results were called by telephone at the time of interpretation on 10/09/2014 at 1:49 am to Dr. Quintella Reichert , who verbally acknowledged these results.   Electronically Signed   By: Lucienne Capers M.D.   On: 10/09/2014 01:51    Scheduled Meds:  Scheduled Meds: . antiseptic oral rinse  7 mL Mouth Rinse BID  . benzonatate  100 mg Oral TID  . docusate sodium  100 mg Oral BID  .  enoxaparin (LOVENOX) injection  105 mg Subcutaneous Q12H  . sodium chloride  3 mL Intravenous Q12H  . warfarin  10 mg Oral ONCE-1800  . warfarin   Does not apply Once  . Warfarin - Pharmacist Dosing Inpatient   Does not apply q1800   Continuous Infusions: . sodium chloride 75 mL/hr at 10/09/14 1000    Time spent on care of this patient: 35 minutes   [place], MD 10/09/2014, 12:16 PM  LOS: 0 days   [facility] Office  [phone] Pager - Text Page per [email or web address]  If 7PM-7AM, please contact night-coverage [email or web address]

## 2014-10-09 NOTE — Progress Notes (Signed)
  Echocardiogram 2D Echocardiogram has been performed.  Patricia Ramsey 10/09/2014, 12:05 PM

## 2014-10-09 NOTE — Progress Notes (Signed)
Utilization review completed. Jarian Longoria, RN, BSN. 

## 2014-10-09 NOTE — ED Notes (Signed)
Patient transported to CT 

## 2014-10-09 NOTE — Discharge Instructions (Addendum)
Information on my medicine - ELIQUIS (apixaban)  This medication education was reviewed with me or my healthcare representative as part of my discharge preparation.  The pharmacist that spoke with me during my hospital stay was:  Blossom Hoops, [facility]  Why was Eliquis prescribed for you? Eliquis was prescribed for you to reduce the risk of forming blood clots that can cause a stroke if you have a medical condition called atrial fibrillation (a type of irregular heartbeat) OR to reduce the risk of a blood clots forming after orthopedic surgery.  What do You need to know about Eliquis ? Take your Eliquis TWICE DAILY - one tablet in the morning and one tablet in the evening with or without food.  It would be best to take the doses about the same time each day.  Take Eliquis 10 mg twice daily by mouth. Beginning Oct 18, 2014, begin taking Eliquis 5 mg twice daily by mouth.    If you have difficulty swallowing the tablet whole please discuss with your pharmacist how to take the medication safely.  Take Eliquis exactly as prescribed by your doctor and DO NOT stop taking Eliquis without talking to the doctor who prescribed the medication.  Stopping may increase your risk of developing a new clot or stroke.  Refill your prescription before you run out.  After discharge, you should have regular check-up appointments with your healthcare provider that is prescribing your Eliquis.  In the future your dose may need to be changed if your kidney function or weight changes by a significant amount or as you get older.  What do you do if you miss a dose? If you miss a dose, take it as soon as you remember on the same day and resume taking twice daily.  Do not take more than one dose of ELIQUIS at the same time.  Important Safety Information A possible side effect of Eliquis is bleeding. You should call your healthcare provider right away if you experience any of the following: ? Bleeding from an injury or  your nose that does not stop. ? Unusual colored urine (red or dark brown) or unusual colored stools (red or black). ? Unusual bruising for unknown reasons. ? A serious fall or if you hit your head (even if there is no bleeding).  Some medicines may interact with Eliquis and might increase your risk of bleeding or clotting while on Eliquis. To help avoid this, consult your healthcare provider or pharmacist prior to using any new prescription or non-prescription medications, including herbals, vitamins, non-steroidal anti-inflammatory drugs (NSAIDs) and supplements.  This website has more information on Eliquis (apixaban): [email or web address].

## 2014-10-09 NOTE — Progress Notes (Signed)
*  Preliminary Results* Bilateral lower extremity venous duplex completed. The right lower extremity is positive for deep vein thrombosis involving the right popliteal and right peroneal veins. There is no obvious evidence of deep vein thrombosis involving the left lower extremity. There is no evidence of Baker's cyst bilaterally.  Preliminary results discussed with Ander Purpura, RN.  10/09/2014  Maudry Mayhew, RVT, RDCS, RDMS

## 2014-10-09 NOTE — Progress Notes (Addendum)
ANTICOAGULATION CONSULT NOTE - Initial Consult  Pharmacy Consult for Lovenox Indication: pulmonary embolus  No Known Allergies  Patient Measurements: Height: 5\' 3"  (160 cm) Weight: 230 lb (104.327 kg) IBW/kg (Calculated) : 52.4   Vital Signs: Temp: 99.8 F (37.7 C) (05/09 2025) Temp Source: Oral (05/09 2025) BP: 119/73 mmHg (05/10 0015) Pulse Rate: 97 (05/10 0015)  Labs:  Recent Labs  10/08/14 2031  HGB 13.1  HCT 39.9  PLT 292  CREATININE 0.87    Estimated Creatinine Clearance: 89.4 mL/min (by C-G formula based on Cr of 0.87).   Medical History: Past Medical History  Diagnosis Date  . Asthma   . Diabetes     boarderline, pt stopped Glucophage  . Hypertension     no meds  . Sleep apnea     has CPAP does not use    Medications:  See medication reconciliation PTA meds  [name] presented to ED on 10/08/14 PM for evaluation of chest pain and SOB. She reports right leg swelling that has been present since she had surgery on her right knee a few weeks ago. S/p KNEE ARTHROSCOPY WITH PARTIAL MEDIAL MENISECTOMY (Right) due to right knee meniscal tear. Patient's mother has hx/o DVT.   Pharmacy consulted to dose lovenox for PE Weigh 104.3 kg,  SCr 0.87,   Estimated CrCl = 89 ml/min, CBC wnl with PLTC 292K. Patient was NOT on anticoagulation med prior to admit. No report of any complications of bleeding history  Goal of Therapy:  Anti-Xa level 0.6-1 units/ml 4hrs after LMWH dose given Monitor platelets by anticoagulation protocol: Yes   Plan:  Lovenox 105 mg (1mg /kg) SQ q12 hours CBC q72 hours Weekly SCr F/u on plans for longterm anticoagulation  Thank you for allowing pharmacy to be part of this patients care team. Nicole Cella, RPh Clinical Pharmacist Pager: [phone] 10/09/2014,12:48 AM

## 2014-10-09 NOTE — Progress Notes (Signed)
ANTICOAGULATION CONSULT NOTE - Initial Consult  Pharmacy Consult for Coumadin Indication: pulmonary embolus  Labs:  Recent Labs  10/08/14 2031  HGB 13.1  HCT 39.9  PLT 292  CREATININE 0.87     Assessment: 49yo female started on LMWH for PE s/p knee surgery, to begin Coumadin.  Goal of Therapy:  INR 2-3   Plan:  Will give Coumadin 10mg  po x1 this evening and monitor INR for dose adjustments; will begin Coumadin education.  Wynona Neat, PharmD, BCPS  10/09/2014,3:10 AM

## 2014-10-10 DIAGNOSIS — J45901 Unspecified asthma with (acute) exacerbation: Secondary | ICD-10-CM

## 2014-10-10 DIAGNOSIS — R071 Chest pain on breathing: Secondary | ICD-10-CM

## 2014-10-10 DIAGNOSIS — J9621 Acute and chronic respiratory failure with hypoxia: Secondary | ICD-10-CM

## 2014-10-10 LAB — CBC
HCT: 36.4 % (ref 36.0–46.0)
HEMOGLOBIN: 11.6 g/dL — AB (ref 12.0–15.0)
MCH: 28 pg (ref 26.0–34.0)
MCHC: 31.9 g/dL (ref 30.0–36.0)
MCV: 87.9 fL (ref 78.0–100.0)
PLATELETS: 260 10*3/uL (ref 150–400)
RBC: 4.14 MIL/uL (ref 3.87–5.11)
RDW: 15 % (ref 11.5–15.5)
WBC: 7.1 10*3/uL (ref 4.0–10.5)

## 2014-10-10 LAB — GLUCOSE, CAPILLARY
GLUCOSE-CAPILLARY: 91 mg/dL (ref 70–99)
GLUCOSE-CAPILLARY: 111 mg/dL — AB (ref 70–99)
GLUCOSE-CAPILLARY: 100 mg/dL — AB (ref 70–99)
Glucose-Capillary: 91 mg/dL (ref 70–99)

## 2014-10-10 LAB — PROTIME-INR
INR: 1.14 (ref 0.00–1.49)
PROTHROMBIN TIME: 14.7 s (ref 11.6–15.2)

## 2014-10-10 MED ORDER — BISACODYL 10 MG RE SUPP
10.0000 mg | Freq: Once | RECTAL | Status: DC
  Filled 2014-10-10: qty 1

## 2014-10-10 MED ORDER — IPRATROPIUM-ALBUTEROL 0.5-2.5 (3) MG/3ML IN SOLN
3.0000 mL | Freq: Four times a day (QID) | RESPIRATORY_TRACT | Status: DC
  Administered 2014-10-11 – 2014-10-13 (×9): 3 mL via RESPIRATORY_TRACT
  Filled 2014-10-10 (×9): qty 3

## 2014-10-10 MED ORDER — HYDROCOD POLST-CPM POLST ER 10-8 MG/5ML PO SUER
5.0000 mL | Freq: Two times a day (BID) | ORAL | Status: DC
  Administered 2014-10-10 – 2014-10-13 (×6): 5 mL via ORAL
  Filled 2014-10-10 (×6): qty 5

## 2014-10-10 MED ORDER — WARFARIN SODIUM 10 MG PO TABS
10.0000 mg | ORAL_TABLET | Freq: Once | ORAL | Status: AC
  Administered 2014-10-10: 10 mg via ORAL
  Filled 2014-10-10: qty 1

## 2014-10-10 MED ORDER — IPRATROPIUM-ALBUTEROL 0.5-2.5 (3) MG/3ML IN SOLN
3.0000 mL | RESPIRATORY_TRACT | Status: DC
  Administered 2014-10-10 (×3): 3 mL via RESPIRATORY_TRACT
  Filled 2014-10-10 (×4): qty 3

## 2014-10-10 MED ORDER — SENNOSIDES-DOCUSATE SODIUM 8.6-50 MG PO TABS
2.0000 | ORAL_TABLET | Freq: Every day | ORAL | Status: DC
  Administered 2014-10-10 – 2014-10-13 (×4): 2 via ORAL
  Filled 2014-10-10 (×5): qty 2

## 2014-10-10 NOTE — Clinical Documentation Improvement (Signed)
Presents with an Acute submassive segmental saddle PE with moderate to severe clot burden.   Echo reveals: EF of 38-18% with diastolic dysfunction and right ventricular strain  1+ pitting edema documented in ED  Has moderate pulmonary hypertension with PA peak pressures of 59  History of Sleep Apnea  Please provide a possible diagnosis associated with the above clinical indicators and document findings in next progress note and include in discharge summary if applicable.   Acute cor pulmonale  Chronic cor pulmonale  Cor pulmonale ruled out  Other Condition  Cannot clinically determine  Thank You, Zoila Shutter ,RN Clinical Documentation Specialist:  [facility]

## 2014-10-10 NOTE — Progress Notes (Addendum)
[facility]  [facility] Progress Note   Patricia Ramsey MVE:[id number] DOB: 02-16-1966 DOA: 10/08/2014 PCP: Elyn Peers, MD  Brief narrative: Patricia Ramsey is a 49 y.o. female  with PMH of Asthma, DM, HTN, OSA who underwent a right knee arthroscopy and chondroplasty on 4/22 by Dr Lorin Mercy and has since had swelling in her right leg. She presented to the ER with a complaints of chest pain and shortness of breath. CTA performed in the ER revealed evidence of b/l large central and segmental pulmonary emboli with right heart strain. She admits that she has not been very mobile since her knee surgery and has essentially been either sitting in a chair laying in bed for a good 2 weeks.   Subjective: Pain in right lower rib cage resolved today. Using Oxycodone intermittently. Has a severe cough and is still short of breath when moving around. Constipated. No nausea, vomiting or abdominal pain.   Assessment/Plan: Principal Problem:   Acute pulmonary embolism/ Hypoxia/sinus tachycardia -Likely secondary to right lower extremity DVT -his duplex performed on 5/10 reveals right popliteal and peroneal DVT- she has had right lower extremity swelling after right knee surgery- -continue Lovenox-patient has chosen to start Coumadin which was started on 5/10- I have tried to get in touch with the daughter today to see if she would prefer Pradaxa as another option for her mother as it does have a reversal agent unlike the other NOACs -Continues to require 2 L of oxygen- have asked RN to do screen for home O2 today -CT suggestive of right heart strain-Case was discussed with pulmonary / critical care  in the ER who did not feel the patient needed thrombolytics -2-D echo reveals mod LVH, EF 28-36%, G 1 Diastolic dysfunction, mod pulm HTN, normal RV function- there is focal basal septal hypertrophy  - will need to increase mobility today- patient has not gotten out of bed since admission - I have  asked her to administer Lovenox herself today - have asked RN to schedule an appt with Dr Criss Rosales for Friday and again on Monday for INR checks  Active Problems:  Acute respiratory failure/ asthma exacerbation - per patient, her asthma has been quite controlled overall - acute resp failure due to acute PE and now due to Asthma exacerbation- has mild wheezing and increased cough- sputum is white - start Duonebs today and Tussionex- cont Tessalon - cont to monitor resp status in SDU- hold off on steroids today  Left lower chest pain -Worse when coughing- much improved today -Likely pleuritic chest pain in relation to acute PE -PRN oxycodone/acetaminophen  Constipation - Miralax and Senna/ Docusate  DM 2 - hold Metformin due to CT with contrast- started sliding scale insulin- sugars well controlled   Code Status: Full code Family Communication: attempted to call daughter today- no answer- message left Disposition Plan: Home when stable- needs follow-up for INR checks  DVT prophylaxis: Lovenox Consultants: Procedures:  Antibiotics: Anti-infectives    None      Objective: Filed Weights   10/08/14 2025 10/09/14 0400  Weight: 104.327 kg (230 lb) 112.4 kg (247 lb 12.8 oz)    Intake/Output Summary (Last 24 hours) at 10/10/14 1129 Last data filed at 10/10/14 1008  Gross per 24 hour  Intake   2355 ml  Output   1850 ml  Net    505 ml     Vitals Filed Vitals:   10/10/14 0600 10/10/14 0752 10/10/14 0800 10/10/14 1000  BP: 130/98 119/77 128/83  112/75  Pulse:  94    Temp:  98.4 F (36.9 C)    TempSrc:  Oral    Resp: 18 21 18 17   Height:      Weight:      SpO2: 98% 97% 94% 95%    Exam:  General:  Pt is alert, not in acute distress  HEENT: No icterus, No thrush  Cardiovascular: regular rate and rhythm, S1/S2 No murmur  Respiratory: Mild wheezing in right lower lobe. Significant amount of cough which is productive of white sputum-no crackles  Abdomen: Soft, +Bowel  sounds, non tender, non distended, no guarding  MSK: No LE edema, cyanosis or clubbing  Data Reviewed: Basic Metabolic Panel:  Recent Labs Lab 10/08/14 2031  NA 139  K 4.0  CL 104  CO2 22  GLUCOSE 99  BUN 10  CREATININE 0.87  CALCIUM 9.3   Liver Function Tests: No results for input(s): AST, ALT, ALKPHOS, BILITOT, PROT, ALBUMIN in the last 168 hours. No results for input(s): LIPASE, AMYLASE in the last 168 hours. No results for input(s): AMMONIA in the last 168 hours. CBC:  Recent Labs Lab 10/08/14 2031 10/10/14 0528  WBC 12.1* 7.1  HGB 13.1 11.6*  HCT 39.9 36.4  MCV 85.8 87.9  PLT 292 260   Cardiac Enzymes: No results for input(s): CKTOTAL, CKMB, CKMBINDEX, TROPONINI in the last 168 hours. BNP (last 3 results)  Recent Labs  10/09/14 0641  BNP 104.4*    ProBNP (last 3 results) No results for input(s): PROBNP in the last 8760 hours.  CBG:  Recent Labs Lab 10/09/14 1244 10/09/14 1732 10/09/14 2143 10/10/14 0754  GLUCAP 77 91 99 91    Recent Results (from the past 240 hour(s))  MRSA PCR Screening     Status: None   Collection Time: 10/09/14  4:05 AM  Result Value Ref Range Status   MRSA by PCR NEGATIVE NEGATIVE Final    Comment:        The GeneXpert MRSA Assay (FDA approved for NASAL specimens only), is one component of a comprehensive MRSA colonization surveillance program. It is not intended to diagnose MRSA infection nor to guide or monitor treatment for MRSA infections.      Studies: Dg Chest 2 View  10/08/2014   CLINICAL DATA:  Left chest pain radiating to the left upper back. Shortness of breath. Dry cough. Wheezing and chest congestion.  EXAM: CHEST  2 VIEW  COMPARISON:  08/29/2013.  FINDINGS: Normal sized heart. Interval small amount of linear density in the right middle lobe and lingula. Otherwise, clear lungs. Minimal thoracic spine degenerative changes.  IMPRESSION: Minimal linear atelectasis or scarring in the right middle lobe and  lingula. Otherwise, unremarkable examination.   Electronically Signed   By: Claudie Revering M.D.   On: 10/08/2014 21:18   Ct Angio Chest Pe W/cm &/or Wo Cm  10/09/2014   CLINICAL DATA:  Left chest pain radiating to the left upper back. Shortness of breath. Dry cough. Wheezing and chest congestion. Onset this week.  EXAM: CT ANGIOGRAPHY CHEST WITH CONTRAST  TECHNIQUE: Multidetector CT imaging of the chest was performed using the standard protocol during bolus administration of intravenous contrast. Multiplanar CT image reconstructions and MIPs were obtained to evaluate the vascular anatomy.  CONTRAST:  52mL OMNIPAQUE IOHEXOL 350 MG/ML SOLN  COMPARISON:  Chest radiograph 10/08/2014  FINDINGS: Technically adequate study with good opacification of the central and segmental pulmonary arteries. Motion artifact is present. Multiple filling defects are demonstrated in  the central, lobar, and segmental pulmonary arteries bilaterally representing large bilateral pulmonary emboli with significant clot burden present. The RV to LV ratio calculates to about 2, indicating increased risk of right heart strain.  Normal caliber thoracic aorta. No significant lymphadenopathy in the chest. Esophagus is decompressed.  Evaluation of lungs is limited due to respiratory motion artifact. No discrete consolidation identified. Visualized airways appear patent. No pleural effusions. No pneumothorax.  Included portions of the upper abdominal organs are grossly unremarkable. Degenerative changes in the spine.  Review of the MIP images confirms the above findings.  IMPRESSION: Large central and segmental pulmonary emboli bilaterally. Positive for acute PE with CT evidence of right heart strain (RV/LV Ratio = 2) consistent with at least submassive (intermediate risk) PE. The presence of right heart strain has been associated with an increased risk of morbidity and mortality. Please activate Code PE by paging [phone].  These results were  called by telephone at the time of interpretation on 10/09/2014 at 1:49 am to Dr. Quintella Reichert , who verbally acknowledged these results.   Electronically Signed   By: Lucienne Capers M.D.   On: 10/09/2014 01:51    Scheduled Meds:  Scheduled Meds: . antiseptic oral rinse  7 mL Mouth Rinse BID  . benzonatate  100 mg Oral TID  . bisacodyl  10 mg Rectal Once  . chlorpheniramine-HYDROcodone  5 mL Oral Q12H  . docusate sodium  100 mg Oral BID  . enoxaparin (LOVENOX) injection  1 mg/kg Subcutaneous Q12H  . insulin aspart  0-5 Units Subcutaneous QHS  . insulin aspart  0-9 Units Subcutaneous TID WC  . ipratropium-albuterol  3 mL Nebulization Q4H  . senna-docusate  2 tablet Oral Daily  . sodium chloride  3 mL Intravenous Q12H  . warfarin  10 mg Oral ONCE-1800  . Warfarin - Pharmacist Dosing Inpatient   Does not apply q1800   Continuous Infusions:    Time spent on care of this patient: 35 minutes   [place], MD 10/10/2014, 11:29 AM  LOS: 1 day   [facility] Office  [phone] Pager - Text Page per [email or web address]  If 7PM-7AM, please contact night-coverage [email or web address]

## 2014-10-10 NOTE — Progress Notes (Signed)
ANTICOAGULATION CONSULT NOTE - Initial Consult  Pharmacy Consult for Lovenox Indication: pulmonary embolus  No Known Allergies  Patient Measurements: Height: 5\' 3"  (160 cm) Weight: 247 lb 12.8 oz (112.4 kg) IBW/kg (Calculated) : 52.4   Vital Signs: Temp: 98.4 F (36.9 C) (05/11 0752) Temp Source: Oral (05/11 0752) BP: 128/83 mmHg (05/11 0800) Pulse Rate: 94 (05/11 0752)  Labs:  Recent Labs  10/08/14 2031 10/09/14 0525 10/10/14 0528  HGB 13.1  --  11.6*  HCT 39.9  --  36.4  PLT 292  --  260  LABPROT  --  14.9 14.7  INR  --  1.15 1.14  CREATININE 0.87  --   --     Estimated Creatinine Clearance: 93.3 mL/min (by C-G formula based on Cr of 0.87).   Medical History: Past Medical History  Diagnosis Date  . Asthma   . Diabetes     boarderline, pt stopped Glucophage  . Hypertension     no meds  . Sleep apnea     has CPAP does not use    Medications:  See medication reconciliation PTA meds  [name] presented to ED on 10/08/14 PM for evaluation of chest pain and SOB. She reports right leg swelling that has been present since she had surgery on her right knee a few weeks ago. S/p KNEE ARTHROSCOPY WITH PARTIAL MEDIAL MENISECTOMY (Right) due to right knee meniscal tear. CT revealed large submassive segmental saddle PE w/ RH strain (RV/LV Ratio = 2), no fibrinolytic therapy.  In addition, 5/10 prelim results of Bilat LE duplex positive for DVT R popliteal and R peroneal veins.    Pharmacy was consulted to dose lovenox bridge to warfarin.  Weigh 104.3 kg,  SCr 0.87,   Estimated CrCl > 90 ml/min, Hgb 13.1 > 11.6.  Patient was NOT on anticoagulation med prior to admit. No report of any complications of bleeding.    INR today is SUBtherapeutic at 1.14.    Goal of Therapy:  Anti-Xa level 0.6-1 units/ml 4hrs after LMWH dose given Monitor platelets by anticoagulation protocol: Yes   Plan:  Lovenox 105 mg (1mg /kg) SQ q12 hours Warfarin 10 mg x 1  tonight CBC q72 hours Weekly SCr F/u on plans for longterm anticoagulation  Hassie Bruce, Pharm. D. Clinical Pharmacy Resident Pager: [phone] Ph: [phone] 10/10/2014 10:19 AM

## 2014-10-11 DIAGNOSIS — I2699 Other pulmonary embolism without acute cor pulmonale: Secondary | ICD-10-CM

## 2014-10-11 LAB — COMPREHENSIVE METABOLIC PANEL
ALT: 14 U/L (ref 14–54)
ANION GAP: 9 (ref 5–15)
AST: 17 U/L (ref 15–41)
Albumin: 2.9 g/dL — ABNORMAL LOW (ref 3.5–5.0)
Alkaline Phosphatase: 76 U/L (ref 38–126)
BILIRUBIN TOTAL: 0.2 mg/dL — AB (ref 0.3–1.2)
CHLORIDE: 98 mmol/L — AB (ref 101–111)
CO2: 31 mmol/L (ref 22–32)
CREATININE: 0.79 mg/dL (ref 0.44–1.00)
Calcium: 8.7 mg/dL — ABNORMAL LOW (ref 8.9–10.3)
GFR calc non Af Amer: >60 mL/min (ref >60)
GLUCOSE: 119 mg/dL — AB (ref 65–99)
POTASSIUM: 4 mmol/L (ref 3.5–5.1)
Sodium: 138 mmol/L (ref 135–145)
TOTAL PROTEIN: 6.8 g/dL (ref 6.5–8.1)

## 2014-10-11 LAB — GLUCOSE, CAPILLARY
GLUCOSE-CAPILLARY: 84 mg/dL (ref 65–99)
Glucose-Capillary: 103 mg/dL — ABNORMAL HIGH (ref 65–99)
Glucose-Capillary: 99 mg/dL (ref 65–99)
Glucose-Capillary: 93 mg/dL (ref 65–99)

## 2014-10-11 LAB — PROTIME-INR
INR: 1.32 (ref 0.00–1.49)
Prothrombin Time: 16.5 seconds — ABNORMAL HIGH (ref 11.6–15.2)

## 2014-10-11 MED ORDER — TRAMADOL HCL 50 MG PO TABS
100.0000 mg | ORAL_TABLET | Freq: Four times a day (QID) | ORAL | Status: DC | PRN
  Administered 2014-10-11 – 2014-10-13 (×5): 100 mg via ORAL
  Filled 2014-10-11 (×6): qty 2

## 2014-10-11 MED ORDER — APIXABAN 5 MG PO TABS
10.0000 mg | ORAL_TABLET | Freq: Two times a day (BID) | ORAL | Status: DC
  Administered 2014-10-11 – 2014-10-13 (×5): 10 mg via ORAL
  Filled 2014-10-11 (×7): qty 2

## 2014-10-11 MED ORDER — GABAPENTIN 100 MG PO CAPS
100.0000 mg | ORAL_CAPSULE | Freq: Three times a day (TID) | ORAL | Status: DC
  Administered 2014-10-11 – 2014-10-13 (×6): 100 mg via ORAL
  Filled 2014-10-11 (×9): qty 1

## 2014-10-11 MED ORDER — ACETAMINOPHEN 325 MG PO TABS
650.0000 mg | ORAL_TABLET | Freq: Four times a day (QID) | ORAL | Status: DC | PRN
  Administered 2014-10-12 – 2014-10-13 (×3): 650 mg via ORAL
  Filled 2014-10-11 (×3): qty 2

## 2014-10-11 MED ORDER — APIXABAN 5 MG PO TABS
10.0000 mg | ORAL_TABLET | Freq: Two times a day (BID) | ORAL | Status: DC
  Filled 2014-10-11 (×2): qty 2

## 2014-10-11 MED ORDER — APIXABAN 5 MG PO TABS: 5.0000 mg | ORAL_TABLET | Freq: Two times a day (BID) | ORAL | Status: DC

## 2014-10-11 MED ORDER — BUTALBITAL-APAP-CAFFEINE 50-325-40 MG PO TABS
2.0000 | ORAL_TABLET | Freq: Once | ORAL | Status: AC
  Administered 2014-10-11: 2 via ORAL
  Filled 2014-10-11: qty 2

## 2014-10-11 NOTE — Progress Notes (Addendum)
[facility]  [facility] Progress Note   Embree Brawley NWG:[id number] DOB: 1966/05/19 DOA: 10/08/2014 PCP: Elyn Peers, MD  Brief narrative: Patricia Ramsey is a 49 y.o. female  with PMH of Asthma, DM, HTN, OSA who underwent a right knee arthroscopy and chondroplasty on 4/22 by Dr Lorin Mercy and has since had swelling in her right leg. She presented to the ER with a complaints of chest pain and shortness of breath. CTA performed in the ER revealed evidence of b/l large central and segmental pulmonary emboli with right heart strain. She admits that she has not been very mobile since her knee surgery and has essentially been either sitting in a chair laying in bed for a good 2 weeks.   Subjective: Reportedly had some hemoptysis , minimal chest pain and shortness of breath  Assessment/Plan: Principal Problem:   Acute pulmonary embolism/ Hypoxia/sinus tachycardia -Likely secondary to right lower extremity DVT -his duplex performed on 5/10 reveals right popliteal and peroneal DVT- she has had right lower extremity swelling after right knee surgery- -continue Lovenox-patient has chosen to start Coumadin which was started on 5/10- Switched to NOACs, discussed Eliquis and the patient is agreeable -Continues to require 2 L of oxygen- have asked RN to do screen for home O2 today -CT suggestive of right heart strain-Case was discussed with pulmonary / critical care  in the ER who did not feel the patient needed thrombolytics -2-D echo reveals mod LVH, EF 57-84%, G 1 Diastolic dysfunction, mod pulm HTN, normal RV function- there is focal basal septal hypertrophy  - will need to increase mobility today- PT/OT eval - Hemoptysis Trace amount, continue to monitor  Acute respiratory failure/ asthma exacerbation - per patient, her asthma has been quite controlled overall - acute resp failure due to acute PE and now due to Asthma exacerbation- has mild wheezing and increased cough- sputum  is white -Continue Duonebs today and Tussionex- cont Tessalon Possibly transfer to telemetry today or tomorrow depending on progress  Left lower chest pain, related to pulmonary embolism -Worse when coughing- much improved today -Likely pleuritic chest pain in relation to acute PE -PRN oxycodone/acetaminophen  Constipation - Miralax and Senna/ Docusate  DM 2 - hold Metformin due to CT with contrast- started sliding scale insulin- sugars well controlled  Sleep apnea We'll start the patient on CPAP, she uses this at home   Code Status: Full code Family Communication: attempted to call daughter today- no answer- message left Disposition Plan: Possibly transfer to telemetry today or tomorrow depending on progress DVT prophylaxis: Eliquis Consultants: Procedures:  Antibiotics: Anti-infectives    None      Objective: Filed Weights   10/08/14 2025 10/09/14 0400  Weight: 104.327 kg (230 lb) 112.4 kg (247 lb 12.8 oz)    Intake/Output Summary (Last 24 hours) at 10/11/14 0906 Last data filed at 10/11/14 0400  Gross per 24 hour  Intake    400 ml  Output   1500 ml  Net  -1100 ml     Vitals Filed Vitals:   10/11/14 0300 10/11/14 0400 10/11/14 0716 10/11/14 0812  BP: 90/39 90/39 117/65   Pulse: 97  89   Temp: 98.8 F (37.1 C)  99.1 F (37.3 C)   TempSrc: Oral  Oral   Resp: 20 16 16    Height:      Weight:      SpO2: 94% 94% 95% 97%    Exam:  General:  Pt is alert, not in acute distress  HEENT: No icterus, No thrush  Cardiovascular: regular rate and rhythm, S1/S2 No murmur  Respiratory: Mild wheezing in right lower lobe. Significant amount of cough which is productive of white sputum-no crackles  Abdomen: Soft, +Bowel sounds, non tender, non distended, no guarding  MSK: No LE edema, cyanosis or clubbing  Data Reviewed: Basic Metabolic Panel:  Recent Labs Lab 10/08/14 2031  NA 139  K 4.0  CL 104  CO2 22  GLUCOSE 99  BUN 10  CREATININE 0.87   CALCIUM 9.3   Liver Function Tests: No results for input(s): AST, ALT, ALKPHOS, BILITOT, PROT, ALBUMIN in the last 168 hours. No results for input(s): LIPASE, AMYLASE in the last 168 hours. No results for input(s): AMMONIA in the last 168 hours. CBC:  Recent Labs Lab 10/08/14 2031 10/10/14 0528  WBC 12.1* 7.1  HGB 13.1 11.6*  HCT 39.9 36.4  MCV 85.8 87.9  PLT 292 260   Cardiac Enzymes: No results for input(s): CKTOTAL, CKMB, CKMBINDEX, TROPONINI in the last 168 hours. BNP (last 3 results)  Recent Labs  10/09/14 0641  BNP 104.4*    ProBNP (last 3 results) No results for input(s): PROBNP in the last 8760 hours.  CBG:  Recent Labs Lab 10/10/14 0754 10/10/14 1213 10/10/14 1700 10/10/14 2206 10/11/14 0719  GLUCAP 91 111* 100* 91 103*    Recent Results (from the past 240 hour(s))  MRSA PCR Screening     Status: None   Collection Time: 10/09/14  4:05 AM  Result Value Ref Range Status   MRSA by PCR NEGATIVE NEGATIVE Final    Comment:        The GeneXpert MRSA Assay (FDA approved for NASAL specimens only), is one component of a comprehensive MRSA colonization surveillance program. It is not intended to diagnose MRSA infection nor to guide or monitor treatment for MRSA infections.      Studies: No results found.  Scheduled Meds:  Scheduled Meds: . antiseptic oral rinse  7 mL Mouth Rinse BID  . benzonatate  100 mg Oral TID  . bisacodyl  10 mg Rectal Once  . chlorpheniramine-HYDROcodone  5 mL Oral Q12H  . docusate sodium  100 mg Oral BID  . enoxaparin (LOVENOX) injection  1 mg/kg Subcutaneous Q12H  . insulin aspart  0-5 Units Subcutaneous QHS  . insulin aspart  0-9 Units Subcutaneous TID WC  . ipratropium-albuterol  3 mL Nebulization QID  . senna-docusate  2 tablet Oral Daily  . sodium chloride  3 mL Intravenous Q12H  . Warfarin - Pharmacist Dosing Inpatient   Does not apply q1800   Continuous Infusions:    Time spent on care of this patient:  35 minutes   [name], MD 10/11/2014, 9:06 AM  LOS: 2 days   [facility] Office  [phone] Pager - Text Page per [email or web address]  If 7PM-7AM, please contact night-coverage [email or web address]

## 2014-10-11 NOTE — Progress Notes (Signed)
Patient ID: Patricia Ramsey, female   DOB: July 26, 1965, 49 y.o.   MRN: [id number] Patient post op from knee arthroscopy seen one week after surgery doing well.  Developed symptoms with cough , dyspnea.    PE and DVT.  Right knee no hemarthrosis. Swelling down. She is Weight bearing with crutches as needed. Can followup with me in 4 wks.  Thanks.   My phone [phone]

## 2014-10-11 NOTE — Progress Notes (Signed)
ANTICOAGULATION CONSULT NOTE - Initial Consult  Pharmacy Consult for Apixaban Indication: pulmonary embolus  No Known Allergies  Patient Measurements: Height: 5\' 3"  (160 cm) Weight: 247 lb 12.8 oz (112.4 kg) IBW/kg (Calculated) : 52.4  Vital Signs: Temp: 99.1 F (37.3 C) (05/12 0716) Temp Source: Oral (05/12 0716) BP: 117/65 mmHg (05/12 0716) Pulse Rate: 89 (05/12 0716)  Labs:  Recent Labs  10/08/14 2031 10/09/14 0525 10/10/14 0528 10/11/14 0334  HGB 13.1  --  11.6*  --   HCT 39.9  --  36.4  --   PLT 292  --  260  --   LABPROT  --  14.9 14.7 16.5*  INR  --  1.15 1.14 1.32  CREATININE 0.87  --   --   --     Estimated Creatinine Clearance: 93.3 mL/min (by C-G formula based on Cr of 0.87).   Medical History: Past Medical History  Diagnosis Date  . Asthma   . Diabetes     boarderline, pt stopped Glucophage  . Hypertension     no meds  . Sleep apnea     has CPAP does not use    Medications:  Scheduled:  . antiseptic oral rinse  7 mL Mouth Rinse BID  . apixaban  10 mg Oral BID  . [START ON 10/18/2014] apixaban  5 mg Oral BID  . benzonatate  100 mg Oral TID  . bisacodyl  10 mg Rectal Once  . chlorpheniramine-HYDROcodone  5 mL Oral Q12H  . docusate sodium  100 mg Oral BID  . insulin aspart  0-5 Units Subcutaneous QHS  . insulin aspart  0-9 Units Subcutaneous TID WC  . ipratropium-albuterol  3 mL Nebulization QID  . senna-docusate  2 tablet Oral Daily  . sodium chloride  3 mL Intravenous Q12H   Infusions:    Assessment:  49 y.o female presented to ED on 10/08/14 PM for evaluation of chest pain and SOB. CT revealed saddle PE, mod-severe burden, and RH strain.  5/10 Bilat LE duplex positive for DVT R popliteal and R peroneal veins 2/2 to R knee surgery.  Patient originally on lovenox bridge to Warfarin.  Pharmacy has been consulted to initiate apixaban in the setting of PE treatment.    INR 1.32 okay to transition.  Scr 0.87, Crcl > 90, Lytes wnl.   Hgb 13.1  > 11.6 (5/11), plt wnl. No reports of bleeding at this time.  No LFTs avail this admit at this time, but okay back in 2015.     Plan:  - Apixaban 10 mg PO BID x 7 days.  First dose to begin 2 hours before next scheduled dose of lovenox.   - Transition to Apixaban 5 mg PO BID on 10/18/2014. - Monitor for renal function - Monitor for signs and symptoms of bleeding  Hassie Bruce, Pharm. D. Clinical Pharmacy Resident Pager: [phone] Ph: [phone] 10/11/2014 9:28 AM

## 2014-10-12 LAB — GLUCOSE, CAPILLARY
GLUCOSE-CAPILLARY: 102 mg/dL — AB (ref 65–99)
Glucose-Capillary: 110 mg/dL — ABNORMAL HIGH (ref 65–99)
Glucose-Capillary: 93 mg/dL (ref 65–99)
Glucose-Capillary: 88 mg/dL (ref 65–99)

## 2014-10-12 LAB — CBC
HCT: 35.2 % — ABNORMAL LOW (ref 36.0–46.0)
Hemoglobin: 11.2 g/dL — ABNORMAL LOW (ref 12.0–15.0)
MCH: 28.1 pg (ref 26.0–34.0)
MCHC: 31.8 g/dL (ref 30.0–36.0)
MCV: 88.4 fL (ref 78.0–100.0)
Platelets: 263 10*3/uL (ref 150–400)
RBC: 3.98 MIL/uL (ref 3.87–5.11)
RDW: 14.5 % (ref 11.5–15.5)
WBC: 7.1 10*3/uL (ref 4.0–10.5)

## 2014-10-12 LAB — CREATININE, SERUM
Creatinine, Ser: 0.67 mg/dL (ref 0.44–1.00)
GFR calc non Af Amer: >60 mL/min (ref >60)

## 2014-10-12 NOTE — Progress Notes (Signed)
Pt given IS and instructed on proper use; pt up to 1000 on IS; will cont. To monitor.

## 2014-10-12 NOTE — Progress Notes (Signed)
[facility]  [facility] Progress Note   Mariah Gerstenberger SWF:[id number] DOB: 1965/12/19 DOA: 10/08/2014 PCP: Elyn Peers, MD  Brief narrative: Patricia Ramsey is a 49 y.o. female  with PMH of Asthma, DM, HTN, OSA who underwent a right knee arthroscopy and chondroplasty on 4/22 by Dr Lorin Mercy and has since had swelling in her right leg. She presented to the ER with a complaints of chest pain and shortness of breath. CTA performed in the ER revealed evidence of b/l large central and segmental pulmonary emboli with right heart strain. She admits that she has not been very mobile since her knee surgery and has essentially been either sitting in a chair laying in bed for a good 2 weeks.   Subjective:  no further hemoptysis, improving headache , used CPap last night  Assessment/Plan: Principal Problem:   Acute pulmonary embolism/ Hypoxia/sinus tachycardia  right lower extremity DVT -his duplex performed on 5/10 reveals right popliteal and peroneal DVT- she has had right lower extremity swelling after right knee surgery-  initially placed on Lovenox and Coumadin Switched to NOACs  5/12, discussed Eliquis and the patient is agreeable -Continues to require 2 L of oxygen- have asked RN to do screen for home O2 today -CT suggestive of right heart strain-Case was discussed with pulmonary / critical care  in the ER who did not feel the patient needed thrombolytics -2-D echo reveals mod LVH, EF 22-02%, G 1 Diastolic dysfunction, mod pulm HTN, normal RV function- there is focal basal septal hypertrophy   transfer to telemetry today, anticipate discharge tomorrow if stable -  Headache  Started on gabapentin 5/12 , significantly better today after using CPap last night , which helped her sleep  Hemoptysis Trace amount,  resolved  Acute respiratory failure/ asthma exacerbation - per patient, her asthma has been quite controlled overall - acute resp failure due to acute PE and now due to  Asthma exacerbation- has mild wheezing and increased cough- sputum is white -Continue Duonebs today and Tussionex- cont Tessalon   Left lower chest pain, related to pulmonary embolism -Worse when coughing- much improved today -Likely pleuritic chest pain in relation to acute PE -PRN oxycodone/acetaminophen  Constipation - Miralax and Senna/ Docusate  DM 2 - hold Metformin due to CT with contrast- started sliding scale insulin- sugars well controlled  Sleep apnea We'll start the patient on CPAP, she uses this at home   Code Status: Full code Family Communication: attempted to call daughter today- no answer- message left Disposition Plan:  Transfer to telemetry today. DVT prophylaxis: Eliquis Consultants: Procedures:  Antibiotics: Anti-infectives    None      Objective: Filed Weights   10/08/14 2025 10/09/14 0400  Weight: 104.327 kg (230 lb) 112.4 kg (247 lb 12.8 oz)    Intake/Output Summary (Last 24 hours) at 10/12/14 1225 Last data filed at 10/12/14 0600  Gross per 24 hour  Intake    390 ml  Output   1500 ml  Net  -1110 ml     Vitals Filed Vitals:   10/12/14 0740 10/12/14 0800 10/12/14 0905 10/12/14 1133  BP: 113/65   117/63  Pulse:      Temp: 97.8 F (36.6 C)   97.8 F (36.6 C)  TempSrc: Oral   Oral  Resp: 13   13  Height:      Weight:      SpO2: 95% 90% 98% 96%    Exam:  General:  Pt is alert, not in acute  distress  HEENT: No icterus, No thrush  Cardiovascular: regular rate and rhythm, S1/S2 No murmur  Respiratory: Mild wheezing in right lower lobe. Significant amount of cough which is productive of white sputum-no crackles  Abdomen: Soft, +Bowel sounds, non tender, non distended, no guarding  MSK: No LE edema, cyanosis or clubbing  Data Reviewed: Basic Metabolic Panel:  Recent Labs Lab 10/08/14 2031 10/11/14 0939 10/12/14 0300  NA 139 138  --   K 4.0 4.0  --   CL 104 98*  --   CO2 22 31  --   GLUCOSE 99 119*  --   BUN 10 <5*   --   CREATININE 0.87 0.79 0.67  CALCIUM 9.3 8.7*  --    Liver Function Tests:  Recent Labs Lab 10/11/14 0939  AST 17  ALT 14  ALKPHOS 76  BILITOT 0.2*  PROT 6.8  ALBUMIN 2.9*   No results for input(s): LIPASE, AMYLASE in the last 168 hours. No results for input(s): AMMONIA in the last 168 hours. CBC:  Recent Labs Lab 10/08/14 2031 10/10/14 0528 10/12/14 0300  WBC 12.1* 7.1 7.1  HGB 13.1 11.6* 11.2*  HCT 39.9 36.4 35.2*  MCV 85.8 87.9 88.4  PLT 292 260 263   Cardiac Enzymes: No results for input(s): CKTOTAL, CKMB, CKMBINDEX, TROPONINI in the last 168 hours. BNP (last 3 results)  Recent Labs  10/09/14 0641  BNP 104.4*    ProBNP (last 3 results) No results for input(s): PROBNP in the last 8760 hours.  CBG:  Recent Labs Lab 10/11/14 1259 10/11/14 1651 10/11/14 2153 10/12/14 0739 10/12/14 1130  GLUCAP 99 84 93 102* 110*    Recent Results (from the past 240 hour(s))  MRSA PCR Screening     Status: None   Collection Time: 10/09/14  4:05 AM  Result Value Ref Range Status   MRSA by PCR NEGATIVE NEGATIVE Final    Comment:        The GeneXpert MRSA Assay (FDA approved for NASAL specimens only), is one component of a comprehensive MRSA colonization surveillance program. It is not intended to diagnose MRSA infection nor to guide or monitor treatment for MRSA infections.      Studies: No results found.  Scheduled Meds:  Scheduled Meds: . antiseptic oral rinse  7 mL Mouth Rinse BID  . apixaban  10 mg Oral BID  . [START ON 10/18/2014] apixaban  5 mg Oral BID  . benzonatate  100 mg Oral TID  . bisacodyl  10 mg Rectal Once  . chlorpheniramine-HYDROcodone  5 mL Oral Q12H  . docusate sodium  100 mg Oral BID  . gabapentin  100 mg Oral TID  . insulin aspart  0-5 Units Subcutaneous QHS  . insulin aspart  0-9 Units Subcutaneous TID WC  . ipratropium-albuterol  3 mL Nebulization QID  . senna-docusate  2 tablet Oral Daily  . sodium chloride  3 mL  Intravenous Q12H   Continuous Infusions:    Time spent on care of this patient: 35 minutes   Anida Deol, MD 10/12/2014, 12:25 PM  LOS: 3 days   [facility] Office  [phone] Pager - Text Page per [email or web address]  If 7PM-7AM, please contact night-coverage [email or web address]

## 2014-10-12 NOTE — Evaluation (Signed)
Occupational Therapy Evaluation Patient Details Name: Patricia Ramsey MRN: [id number] DOB: 02-May-1966 Today's Date: 10/12/2014    History of Present Illness Pt. admitted 10/08/14 with SOB,chest pain.  PT. found to have acute R LE DVT and  PE  with right heart ztrain in setting of recent R knee arthroscopy as outpatient for removal of "cyst"per pt.  PMH includes asthma, DM, HTN and OSA.     Clinical Impression   Pt was requiring assist for IADL and LB ADL and had sponge bathed since her knee surgery.  Pt currently requiring supervision with RW for ambulation.  Will benefit from education in use of AE and tub equipment.  Will follow acutely.   Follow Up Recommendations  No OT follow up    Equipment Recommendations  Other (comment) (determine need for shower seat)    Recommendations for Other Services       Precautions / Restrictions Precautions Precautions: None Restrictions Weight Bearing Restrictions: No      Mobility Bed Mobility                  Transfers Overall transfer level: Needs assistance Equipment used: Rolling walker (2 wheeled) Transfers: Sit to/from Stand Sit to Stand: Supervision         General transfer comment: no physical assist    Balance                                            ADL Overall ADL's : Needs assistance/impaired Eating/Feeding: Independent;Sitting   Grooming: Wash/dry hands;Supervision/safety;Standing   Upper Body Bathing: Set up;Sitting   Lower Body Bathing: Minimal assistance;Sit to/from stand   Upper Body Dressing : Set up;Sitting   Lower Body Dressing: Minimal assistance;Sit to/from stand   Toilet Transfer: Supervision/safety;Ambulation;RW   Toileting- Clothing Manipulation and Hygiene: Supervision/safety;Sit to/from stand       Functional mobility during ADLs: Supervision/safety;Rolling walker       Vision     Perception     Praxis      Pertinent Vitals/Pain Pain Assessment:  Faces Faces Pain Scale: Hurts little more Pain Location: head Pain Descriptors / Indicators: Aching Pain Intervention(s): Monitored during session     Hand Dominance Left   Extremity/Trunk Assessment Upper Extremity Assessment Upper Extremity Assessment: Overall WFL for tasks assessed   Lower Extremity Assessment Lower Extremity Assessment: Defer to PT evaluation   Cervical / Trunk Assessment Cervical / Trunk Assessment: Normal   Communication Communication Communication: No difficulties   Cognition Arousal/Alertness: Awake/alert Behavior During Therapy: WFL for tasks assessed/performed Overall Cognitive Status: Within Functional Limits for tasks assessed                     General Comments       Exercises       Shoulder Instructions      Home Living Family/patient expects to be discharged to:: Private residence Living Arrangements:  (grandchildren) Available Help at Discharge: Family;Available PRN/intermittently Type of Home: House Home Access: Level entry     Home Layout: One level     Bathroom Shower/Tub: Teacher, early years/pre: Standard     Home Equipment: Crutches;Cane - single point          Prior Functioning/Environment Level of Independence: Needs assistance  Gait / Transfers Assistance Needed: using crutches, but fearful ADL's / Homemaking Assistance Needed: assist for LB bathing  and dressing of daughter, assisted for meals and houskeeping since knee sx        OT Diagnosis: Generalized weakness;Acute pain   OT Problem List: Impaired balance (sitting and/or standing);Pain;Obesity;Decreased knowledge of use of DME or AE;Cardiopulmonary status limiting activity   OT Treatment/Interventions: Self-care/ADL training;DME and/or AE instruction;Patient/family education    OT Goals(Current goals can be found in the care plan section) Acute Rehab OT Goals Patient Stated Goal: back home for recovery; wants to use RW instead of  crutches given to her post arthroscopy OT Goal Formulation: With patient Time For Goal Achievement: 10/19/14 Potential to Achieve Goals: Good ADL Goals Pt Will Transfer to Toilet: with modified independence;regular height toilet;ambulating Pt Will Perform Toileting - Clothing Manipulation and hygiene: with modified independence;sit to/from stand Pt Will Perform Tub/Shower Transfer: Tub transfer;with modified independence;ambulating;rolling walker (determine need for shower seat) Additional ADL Goal #1: Pt will be independent in use of AE for B ADL. Additional ADL Goal #2: Pt will generalize energy conservation strategies in ADL and mobility.  OT Frequency: Min 2X/week   Barriers to D/C:            Co-evaluation              End of Session Equipment Utilized During Treatment: Oxygen;Rolling walker  Activity Tolerance: Patient limited by pain;Patient limited by fatigue Patient left: Other (comment) (in w/c for transfer to 2W)   Time: 5681-2751 OT Time Calculation (min): 17 min Charges:  OT General Charges $OT Visit: 1 Procedure OT Evaluation $Initial OT Evaluation Tier I: 1 Procedure G-Codes:    Malka So 10/12/2014, 4:10 PM  [phone]

## 2014-10-12 NOTE — Evaluation (Signed)
Physical Therapy Evaluation Patient Details Name: Patricia Ramsey MRN: [id number] DOB: Oct 05, 1965 Today's Date: 10/12/2014   History of Present Illness  Pt. admitted 10/08/14 with SOB,chest pain.  PT. found to have acute R LE DVT and  PE  with right heart ztrain in setting of recent R knee arthroscopy as outpatient for removal of "cyst"per pt.  PMH includes asthma, DM, HTN and OSA.    Clinical Impression  Pt. Presents to PT with generalized weakness and decreased ability to ambulate due to recent R knee arthroscopy and acute DVT/PE.  She was educated on pursed lip breathing technique and antiembolic ankle exercises.  She never felt comfortable with crutches and was hesitant to walk with them by her report.  I have recommended a RW which she feels comfortable with and believe she will ambulate more with it.      Follow Up Recommendations No PT follow up;Supervision - Intermittent    Equipment Recommendations  Rolling walker with 5" wheels    Recommendations for Other Services       Precautions / Restrictions Precautions Precautions: None Precaution Comments: Pt. reports difficulty managing herself on crutches with fear of falling Restrictions Weight Bearing Restrictions: No (per Dr. Lorin Mercy' note of 10/11/14  can weight bear)      Mobility  Bed Mobility Overal bed mobility: Modified Independent             General bed mobility comments: pt. able to move supine to sit with no physical assist and in a safe manner  Transfers Overall transfer level: Needs assistance Equipment used: Rolling walker (2 wheeled) Transfers: Sit to/from Stand Sit to Stand: Supervision         General transfer comment: Pt. rises to stand to RW with supervision for safety ; vc's needed for safe and controlled approach to recliner  Ambulation/Gait Ambulation/Gait assistance: Min guard Ambulation Distance (Feet): 3 Feet Assistive device: Rolling walker (2 wheeled) Gait Pattern/deviations: Step-to  pattern     General Gait Details: Pt. able to take several steps with RW to recliner with min guard assist for safety.  Able to accept weight on R LE with no evidence of antalgic pattern.    Stairs            Wheelchair Mobility    Modified Rankin (Stroke Patients Only)       Balance Overall balance assessment: Modified Independent                                           Pertinent Vitals/Pain Pain Assessment: 0-10 Pain Score: 3  Pain Location: headache Pain Descriptors / Indicators: Aching Pain Intervention(s): Limited activity within patient's tolerance;Monitored during session;Premedicated before session;Repositioned  O2 sats on 2L O2 via nasal cannula ranged from 93% to 87% with limited activity of standing and taking 3 steps to recliner chair.  Pt educated on pursed lip breathing technique and can return demonstrate.     Home Living Family/patient expects to be discharged to:: Private residence Living Arrangements: Children;Other relatives (6 kids in the home, 33-13 in age) Available Help at Discharge: Family;Available PRN/intermittently (daughter works alternating shifts as a Quarry manager) Type of Home: House Home Access: Level entry     Home Layout: One level Home Equipment: Crutches;Cane - single point      Prior Function Level of Independence: Independent with assistive device(s) (used cane prior to knee surgery)  Hand Dominance   Dominant Hand: Left    Extremity/Trunk Assessment   Upper Extremity Assessment: Defer to OT evaluation           Lower Extremity Assessment: Overall WFL for tasks assessed      Cervical / Trunk Assessment: Normal  Communication   Communication: No difficulties  Cognition Arousal/Alertness: Awake/alert Behavior During Therapy: WFL for tasks assessed/performed Overall Cognitive Status: Within Functional Limits for tasks assessed                      General Comments General  comments (skin integrity, edema, etc.): Pt. with three steristrips intact about right knee from prior arthroscopy.  No evidence of infection seen.    Exercises General Exercises - Lower Extremity Ankle Circles/Pumps: AROM;Both;10 reps;Seated Straight Leg Raises: AROM;Right;Other (comment) (3 reps)      Assessment/Plan    PT Assessment Patient needs continued PT services  PT Diagnosis Difficulty walking;Generalized weakness   PT Problem List Decreased activity tolerance;Decreased mobility;Decreased knowledge of precautions;Cardiopulmonary status limiting activity  PT Treatment Interventions DME instruction;Gait training;Functional mobility training;Therapeutic activities;Therapeutic exercise;Patient/family education   PT Goals (Current goals can be found in the Care Plan section) Acute Rehab PT Goals Patient Stated Goal: back home for recovery; wants to use RW instead of crutches given to her post arthroscopy PT Goal Formulation: With patient Time For Goal Achievement: 10/19/14 Potential to Achieve Goals: Good    Frequency Min 3X/week   Barriers to discharge   Pt. currently cares for 4 of her other daughter's children.  Children back at the daughter's home at this time, and pt. states she has told the daughter that she cannot care for the kids when she gets [place] home.    Co-evaluation               End of Session Equipment Utilized During Treatment: Gait belt;Oxygen Activity Tolerance: Patient tolerated treatment well;Patient limited by fatigue Patient left: in chair;with call bell/phone within reach Nurse Communication: Mobility status         Time: 5701-7793 PT Time Calculation (min) (ACUTE ONLY): 27 min   Charges:   PT Evaluation $Initial PT Evaluation Tier I: 1 Procedure PT Treatments $Gait Training: 8-22 mins   PT G CodesLadona Ridgel 10/12/2014, 10:09 AM Gerlean Ren PT Acute Rehab Services [phone] Bee[place] [phone]

## 2014-10-13 ENCOUNTER — Inpatient Hospital Stay (HOSPITAL_COMMUNITY): Payer: Medicaid Other

## 2014-10-13 LAB — GLUCOSE, CAPILLARY
GLUCOSE-CAPILLARY: 87 mg/dL (ref 65–99)
Glucose-Capillary: 97 mg/dL (ref 65–99)

## 2014-10-13 MED ORDER — IPRATROPIUM-ALBUTEROL 0.5-2.5 (3) MG/3ML IN SOLN: 3.0000 mL | Freq: Two times a day (BID) | RESPIRATORY_TRACT | Status: DC

## 2014-10-13 MED ORDER — APIXABAN 5 MG PO TABS: 5.0000 mg | ORAL_TABLET | Freq: Two times a day (BID) | ORAL | Status: DC

## 2014-10-13 MED ORDER — DOCUSATE SODIUM 100 MG PO CAPS: 100.0000 mg | ORAL_CAPSULE | Freq: Two times a day (BID) | ORAL | Status: DC

## 2014-10-13 MED ORDER — SENNOSIDES-DOCUSATE SODIUM 8.6-50 MG PO TABS: 2.0000 | ORAL_TABLET | Freq: Every day | ORAL | Status: DC

## 2014-10-13 MED ORDER — HYDROCOD POLST-CPM POLST ER 10-8 MG/5ML PO SUER: 5.0000 mL | Freq: Two times a day (BID) | ORAL | Status: DC

## 2014-10-13 MED ORDER — AMOXICILLIN-POT CLAVULANATE 875-125 MG PO TABS: 1.0000 | ORAL_TABLET | Freq: Two times a day (BID) | ORAL | Status: DC

## 2014-10-13 MED ORDER — SALINE SPRAY 0.65 % NA SOLN: 1.0000 | NASAL | Status: DC | PRN

## 2014-10-13 MED ORDER — GABAPENTIN 300 MG PO CAPS: 300.0000 mg | ORAL_CAPSULE | Freq: Three times a day (TID) | ORAL | Status: AC

## 2014-10-13 MED ORDER — AMOXICILLIN-POT CLAVULANATE 875-125 MG PO TABS
1.0000 | ORAL_TABLET | Freq: Two times a day (BID) | ORAL | Status: DC
  Administered 2014-10-13: 1 via ORAL
  Filled 2014-10-13 (×2): qty 1

## 2014-10-13 MED ORDER — SALINE SPRAY 0.65 % NA SOLN
1.0000 | NASAL | Status: DC | PRN
  Filled 2014-10-13: qty 44

## 2014-10-13 MED ORDER — APIXABAN 5 MG PO TABS: 10.0000 mg | ORAL_TABLET | Freq: Two times a day (BID) | ORAL | Status: DC

## 2014-10-13 MED ORDER — POLYETHYLENE GLYCOL 3350 17 G PO PACK: 17.0000 g | PACK | Freq: Every day | ORAL | Status: DC | PRN

## 2014-10-13 MED ORDER — OXYCODONE-ACETAMINOPHEN 5-325 MG PO TABS: 1.0000 | ORAL_TABLET | Freq: Three times a day (TID) | ORAL | Status: DC | PRN

## 2014-10-13 MED ORDER — BENZONATATE 100 MG PO CAPS: 100.0000 mg | ORAL_CAPSULE | Freq: Three times a day (TID) | ORAL | Status: DC

## 2014-10-13 NOTE — Care Management Note (Signed)
Case Management Note  Patient Details  Name: Patricia Ramsey MRN: [id number] Date of Birth: [birth year]  Subjective/Objective:                   Acute pulmonary embolism Action/Plan:  Discharge planning Expected Discharge Date:                  Expected Discharge Plan:  Home/Self Care  In-House Referral:     Discharge planning Services  CM Consult  Post Acute Care Choice:  NA Choice offered to:  NA  DME Arranged:  Walker rolling DME Agency:  [place]:    [place]:     Status of Service:  Completed, signed off  Medicare Important Message Given:    Date Medicare IM Given:    Medicare IM give by:    Date Additional Medicare IM Given:    Additional Medicare Important Message give by:     If discussed at [place] of Stay Meetings, dates discussed:    Additional Comments: CM received call from RN requesting rolling walker.  Cm called [facility] DME rep, james to please deliver the rollig walker to room so pt can discharge.  No other CM needs were communicated. Dellie Catholic, RN 10/13/2014, 2:01 PM

## 2014-10-13 NOTE — Discharge Summary (Signed)
Physician Discharge Summary  Patricia Ramsey MRN: [id number] DOB/AGE: 1966-04-06 49 y.o.  PCP: Elyn Peers, MD   Admit date: 10/08/2014 Discharge date: 10/13/2014  Discharge Diagnoses:     Principal Problem:   Acute pulmonary embolism Active Problems:   HTN (hypertension)   Sleep apnea in adult   Chest pain  acute sinusitis   Follow-up recommendations Follow-up with PCP in 3-5 days Follow-up CBC, CMP in 3-5 days    Medication List    TAKE these medications        albuterol (2.5 MG/3ML) 0.083% nebulizer solution  Commonly known as:  PROVENTIL  Take 3 mLs (2.5 mg total) by nebulization every 6 (six) hours as needed for wheezing.     albuterol 108 (90 BASE) MCG/ACT inhaler  Commonly known as:  PROVENTIL HFA;VENTOLIN HFA  Inhale 2 puffs into the lungs every 6 (six) hours as needed. For shortness of breath     amoxicillin-clavulanate 875-125 MG per tablet  Commonly known as:  AUGMENTIN  Take 1 tablet by mouth every 12 (twelve) hours.     apixaban 5 MG Tabs tablet  Commonly known as:  ELIQUIS  Take 2 tablets (10 mg total) by mouth 2 (two) times daily.     apixaban 5 MG Tabs tablet  Commonly known as:  ELIQUIS  Take 1 tablet (5 mg total) by mouth 2 (two) times daily.  Start taking on:  10/18/2014     benzonatate 100 MG capsule  Commonly known as:  TESSALON  Take 1 capsule (100 mg total) by mouth 3 (three) times daily.     chlorpheniramine-HYDROcodone 10-8 MG/5ML Suer  Commonly known as:  TUSSIONEX  Take 5 mLs by mouth every 12 (twelve) hours.     docusate sodium 100 MG capsule  Commonly known as:  COLACE  Take 1 capsule (100 mg total) by mouth 2 (two) times daily.     gabapentin 300 MG capsule  Commonly known as:  NEURONTIN  Take 1 capsule (300 mg total) by mouth 3 (three) times daily.     metFORMIN 500 MG tablet  Commonly known as:  GLUCOPHAGE  Take 500 mg by mouth daily.     oxyCODONE-acetaminophen 5-325 MG per tablet  Commonly known as:   PERCOCET/ROXICET  Take 1 tablet by mouth every 8 (eight) hours as needed.     polyethylene glycol packet  Commonly known as:  MIRALAX / GLYCOLAX  Take 17 g by mouth daily as needed for mild constipation.     senna-docusate 8.6-50 MG per tablet  Commonly known as:  Senokot-S  Take 2 tablets by mouth daily.     sodium chloride 0.65 % Soln nasal spray  Commonly known as:  OCEAN  Place 1 spray into both nostrils as needed for congestion.        Discharge Condition: Stable   Disposition: 01-Home or Self Care   Consults: None   Significant Diagnostic Studies: Dg Chest 2 View  10/08/2014   CLINICAL DATA:  Left chest pain radiating to the left upper back. Shortness of breath. Dry cough. Wheezing and chest congestion.  EXAM: CHEST  2 VIEW  COMPARISON:  08/29/2013.  FINDINGS: Normal sized heart. Interval small amount of linear density in the right middle lobe and lingula. Otherwise, clear lungs. Minimal thoracic spine degenerative changes.  IMPRESSION: Minimal linear atelectasis or scarring in the right middle lobe and lingula. Otherwise, unremarkable examination.   Electronically Signed   By: Claudie Revering M.D.   On:  10/08/2014 21:18   Ct Head Wo Contrast  10/13/2014   CLINICAL DATA:  Headache.  EXAM: CT HEAD WITHOUT CONTRAST  TECHNIQUE: Contiguous axial images were obtained from the base of the skull through the vertex without intravenous contrast.  COMPARISON:  CT scan in November 03, 2004.  FINDINGS: Bilateral ethmoid and maxillary sinusitis is noted. Bony calvarium is intact. No mass effect or midline shift is noted. Ventricular size is within normal limits. There is no evidence of mass lesion, hemorrhage or acute infarction.  IMPRESSION: Bilateral ethmoid and maxillary sinusitis. No acute intracranial abnormality seen.   Electronically Signed   By: Marijo Conception, M.D.   On: 10/13/2014 10:13   Ct Angio Chest Pe W/cm &/or Wo Cm  10/09/2014   CLINICAL DATA:  Left chest pain radiating to the  left upper back. Shortness of breath. Dry cough. Wheezing and chest congestion. Onset this week.  EXAM: CT ANGIOGRAPHY CHEST WITH CONTRAST  TECHNIQUE: Multidetector CT imaging of the chest was performed using the standard protocol during bolus administration of intravenous contrast. Multiplanar CT image reconstructions and MIPs were obtained to evaluate the vascular anatomy.  CONTRAST:  42m OMNIPAQUE IOHEXOL 350 MG/ML SOLN  COMPARISON:  Chest radiograph 10/08/2014  FINDINGS: Technically adequate study with good opacification of the central and segmental pulmonary arteries. Motion artifact is present. Multiple filling defects are demonstrated in the central, lobar, and segmental pulmonary arteries bilaterally representing large bilateral pulmonary emboli with significant clot burden present. The RV to LV ratio calculates to about 2, indicating increased risk of right heart strain.  Normal caliber thoracic aorta. No significant lymphadenopathy in the chest. Esophagus is decompressed.  Evaluation of lungs is limited due to respiratory motion artifact. No discrete consolidation identified. Visualized airways appear patent. No pleural effusions. No pneumothorax.  Included portions of the upper abdominal organs are grossly unremarkable. Degenerative changes in the spine.  Review of the MIP images confirms the above findings.  IMPRESSION: Large central and segmental pulmonary emboli bilaterally. Positive for acute PE with CT evidence of right heart strain (RV/LV Ratio = 2) consistent with at least submassive (intermediate risk) PE. The presence of right heart strain has been associated with an increased risk of morbidity and mortality. Please activate Code PE by paging [phone]  These results were called by telephone at the time of interpretation on 10/09/2014 at 1:49 am to Dr. EQuintella Reichert, who verbally acknowledged these results.   Electronically Signed   By: WLucienne CapersM.D.   On: 10/09/2014 01:51     Echocardiogram LVEF 65-70%, moderate LVH with severe focal basal septal hypertrophy, diastolic dysfunction, normal LV filling pressure, normal RV size and systolic function, moderate pulmonary hypertension with RVSP 59 mmHg (very little TR).   Microbiology: Recent Results (from the past 240 hour(s))  MRSA PCR Screening     Status: None   Collection Time: 10/09/14  4:05 AM  Result Value Ref Range Status   MRSA by PCR NEGATIVE NEGATIVE Final    Comment:        The GeneXpert MRSA Assay (FDA approved for NASAL specimens only), is one component of a comprehensive MRSA colonization surveillance program. It is not intended to diagnose MRSA infection nor to guide or monitor treatment for MRSA infections.      Labs: Results for orders placed or performed during the hospital encounter of 10/08/14 (from the past 48 hour(s))  Glucose, capillary     Status: None   Collection Time: 10/11/14 12:59 PM  Result Value Ref Range   Glucose-Capillary 99 65 - 99 mg/dL  Glucose, capillary     Status: None   Collection Time: 10/11/14  4:51 PM  Result Value Ref Range   Glucose-Capillary 84 65 - 99 mg/dL  Glucose, capillary     Status: None   Collection Time: 10/11/14  9:53 PM  Result Value Ref Range   Glucose-Capillary 93 65 - 99 mg/dL   Comment 1 Capillary Specimen   Creatinine, serum     Status: None   Collection Time: 10/12/14  3:00 AM  Result Value Ref Range   Creatinine, Ser 0.67 0.44 - 1.00 mg/dL   GFR calc non Af Amer >60 >60 mL/min   GFR calc Af Amer >60 >60 mL/min    Comment: (NOTE) The eGFR has been calculated using the CKD EPI equation. This calculation has not been validated in all clinical situations. eGFR's persistently <60 mL/min signify possible Chronic Kidney Disease.   CBC     Status: Abnormal   Collection Time: 10/12/14  3:00 AM  Result Value Ref Range   WBC 7.1 4.0 - 10.5 K/uL   RBC 3.98 3.87 - 5.11 MIL/uL   Hemoglobin 11.2 (L) 12.0 - 15.0 g/dL   HCT  35.2 (L) 36.0 - 46.0 %   MCV 88.4 78.0 - 100.0 fL   MCH 28.1 26.0 - 34.0 pg   MCHC 31.8 30.0 - 36.0 g/dL   RDW 14.5 11.5 - 15.5 %   Platelets 263 150 - 400 K/uL  Glucose, capillary     Status: Abnormal   Collection Time: 10/12/14  7:39 AM  Result Value Ref Range   Glucose-Capillary 102 (H) 65 - 99 mg/dL   Comment 1 Capillary Specimen   Glucose, capillary     Status: Abnormal   Collection Time: 10/12/14 11:30 AM  Result Value Ref Range   Glucose-Capillary 110 (H) 65 - 99 mg/dL   Comment 1 Capillary Specimen   Glucose, capillary     Status: None   Collection Time: 10/12/14  4:57 PM  Result Value Ref Range   Glucose-Capillary 93 65 - 99 mg/dL   Comment 1 Notify RN   Glucose, capillary     Status: None   Collection Time: 10/12/14  9:23 PM  Result Value Ref Range   Glucose-Capillary 88 65 - 99 mg/dL  Glucose, capillary     Status: None   Collection Time: 10/13/14  7:01 AM  Result Value Ref Range   Glucose-Capillary 87 65 - 99 mg/dL  Glucose, capillary     Status: None   Collection Time: 10/13/14 11:41 AM  Result Value Ref Range   Glucose-Capillary 97 65 - 99 mg/dL     History of present illness Patricia Ramsey is a 49 y.o. female with PMH of Asthma, DM, HTN, OSA who underwent a right knee arthroscopy and chondroplasty on 4/22 by Dr Lorin Mercy and has since had swelling in her right leg. She presented to the ER with a complaints of chest pain and shortness of breath. CTA performed in the ER revealed evidence of b/l large central and segmental pulmonary emboli with right heart strain. She admits that she has not been very mobile since her knee surgery and has essentially been either sitting in a chair laying in bed for a good 2 weeks.    Assessment/Plan:    Acute pulmonary embolism/ Hypoxia/sinus tachycardia right lower extremity DVT -his duplex performed on 5/10 reveals right popliteal and peroneal DVT- she has had  right lower extremity swelling after right knee  surgery- initially placed on Lovenox and Coumadin Switched to NOACs 5/12, discussed Eliquis and the patient is agreeable Patient currently on room air and saturating about 92% -CT suggestive of right heart strain-Case was discussed with pulmonary / critical care in the ER who did not feel the patient needed thrombolytics -2-D echo reveals mod LVH, EF 61-60%, G 1 Diastolic dysfunction, mod pulm HTN, normal RV function- there is focal basal septal hypertrophy  Patient ambulated with physical therapy, they recommend rolling walker, no PT follow-up needed  -  Headache likely secondary to acute sinusitis Started on gabapentin 5/12 , significantly better today after using CPap last night , which helped her sleep Also started on Augmentin, nasal saline spray  Hemoptysis Trace amount, resolved  Acute respiratory failure/ asthma exacerbation - per patient, her asthma has been quite controlled overall - acute resp failure due to acute PE and now due to Asthma exacerbation- has mild wheezing and increased cough- sputum is white -Continue Duonebs today and Tussionex- cont Tessalon   Left lower chest pain, related to pulmonary embolism -Worse when coughing- much improved today -Likely pleuritic chest pain in relation to acute PE -PRN oxycodone/acetaminophen  Constipation - Miralax and Senna/ Docusate  DM 2 Resume Metformin    Sleep apnea We'll start the patient on CPAP, she uses this at home Would benefit from CPAP at home   Code Status: Full code    Discharge Exam:    Blood pressure 136/89, pulse 84, temperature 98.1 F (36.7 C), temperature source Oral, resp. rate 16, height '5\' 3"'  (1.6 m), weight 112.4 kg (247 lb 12.8 oz), SpO2 98 %.   General: Pt is alert, not in acute distress  HEENT: No icterus, No thrush  Cardiovascular: regular rate and rhythm, S1/S2 No murmur  Respiratory: Mild wheezing in right lower lobe. Significant amount of cough which is productive of  white sputum-no crackles  Abdomen: Soft, +Bowel sounds, non tender, non distended, no guarding  MSK: No LE edema, cyanosis or clubbing       Discharge Instructions    Diet - low sodium heart healthy    Complete by:  As directed      Increase activity slowly    Complete by:  As directed              Signed: Lilianna Case 10/13/2014, 11:46 AM

## 2014-10-13 NOTE — Progress Notes (Signed)
Physical Therapy Treatment Patient Details Name: Patricia Ramsey MRN: [id number] DOB: May 18, 1966 Today's Date: 10/13/2014    History of Present Illness Pt. admitted 10/08/14 with SOB,chest pain.  PT. found to have acute R LE DVT and  PE  with right heart ztrain in setting of recent R knee arthroscopy as outpatient for removal of "cyst"per pt.  PMH includes asthma, DM, HTN and OSA.      PT Comments    Pt doing well with mobility and no further PT needed.  Ready for dc from PT standpoint.    Follow Up Recommendations  No PT follow up;Supervision - Intermittent     Equipment Recommendations  Rolling walker with 5" wheels    Recommendations for Other Services       Precautions / Restrictions Precautions Precautions: None Restrictions Weight Bearing Restrictions: No (per Dr. Lorin Mercy' note of 10/11/14  can weight bear)    Mobility  Bed Mobility Overal bed mobility: Modified Independent             General bed mobility comments: pt. able to move supine to sit with no physical assist and in a safe manner  Transfers Overall transfer level: Modified independent Equipment used: Rolling walker (2 wheeled) Transfers: Sit to/from Stand Sit to Stand: Modified independent (Device/Increase time)         General transfer comment: No difficulty  Ambulation/Gait Ambulation/Gait assistance: Modified independent (Device/Increase time) Ambulation Distance (Feet): 125 Feet Assistive device: Rolling walker (2 wheeled) Gait Pattern/deviations: Step-through pattern;Decreased step length - right;Decreased step length - left   Gait velocity interpretation: Below normal speed for age/gender General Gait Details: Steady with gait   Stairs            Wheelchair Mobility    Modified Rankin (Stroke Patients Only)       Balance                                    Cognition Arousal/Alertness: Awake/alert Behavior During Therapy: WFL for tasks  assessed/performed Overall Cognitive Status: Within Functional Limits for tasks assessed                      Exercises General Exercises - Lower Extremity Ankle Circles/Pumps: AROM;Both;10 reps;Seated Straight Leg Raises: AROM;Right;Other (comment) (3 reps)    General Comments        Pertinent Vitals/Pain      Home Living                      Prior Function            PT Goals (current goals can now be found in the care plan section) Acute Rehab PT Goals Patient Stated Goal: back home for recovery; wants to use RW instead of crutches given to her post arthroscopy PT Goal Formulation: With patient Time For Goal Achievement: 10/19/14 Potential to Achieve Goals: Good Progress towards PT goals: Goals met/education completed, patient discharged from PT    Frequency  Min 3X/week    PT Plan Other (comment) (DC'd from PT due to goals met)    Co-evaluation             End of Session   Activity Tolerance: Patient tolerated treatment well Patient left: in chair;with call bell/phone within reach     Time: 1218-1229 PT Time Calculation (min) (ACUTE ONLY): 11 min  Charges:  $Gait Training: 8-22 mins  G Codes:      Celena Lanius 10/13/2014, 12:45 PM  [facility] PT [phone]

## 2014-10-13 NOTE — Progress Notes (Signed)
PT Cancellation Note  Patient Details Name: Patricia Ramsey MRN: [id number] DOB: 1966-04-07   Cancelled Treatment:    Reason Eval/Treat Not Completed: Patient at procedure or test/unavailable (pt on her way to [place])   [facility] 10/13/2014, 9:41 AM  Suanne Marker PT [phone]

## 2014-10-13 NOTE — Progress Notes (Signed)
Discharge education completed by RN. Pt and daughter received a copy of discharge paperwork and confirm understanding of follow up appointments and discharge medications. Both deny any questions at this time. IV removed, site is within normal limits. Pt will discharge from the unit via wheelchair. 

## 2014-10-13 NOTE — Progress Notes (Signed)
Physical Therapy Discharge Patient Details Name: Patricia Ramsey MRN: [id number] DOB: 1965/12/08 Today's Date: 10/13/2014 Time: 1218-1229 PT Time Calculation (min) (ACUTE ONLY): 11 min  Patient discharged from PT services secondary to goals met and no further PT needs identified.  Please see latest therapy progress note for current level of functioning and progress toward goals.    Progress and discharge plan discussed with patient and/or caregiver: Patient/Caregiver agrees with plan  GP     [facility] 10/13/2014, 12:47 PM  [facility] PT [phone]

## 2014-12-16 ENCOUNTER — Ambulatory Visit (HOSPITAL_BASED_OUTPATIENT_CLINIC_OR_DEPARTMENT_OTHER): Payer: Medicaid Other | Attending: Family Medicine

## 2014-12-16 VITALS — Ht 63.0 in | Wt 230.0 lb

## 2014-12-16 DIAGNOSIS — G4733 Obstructive sleep apnea (adult) (pediatric): Secondary | ICD-10-CM | POA: Diagnosis present

## 2014-12-16 DIAGNOSIS — G4736 Sleep related hypoventilation in conditions classified elsewhere: Secondary | ICD-10-CM | POA: Diagnosis not present

## 2014-12-16 DIAGNOSIS — R0683 Snoring: Secondary | ICD-10-CM | POA: Diagnosis not present

## 2014-12-22 ENCOUNTER — Ambulatory Visit (HOSPITAL_BASED_OUTPATIENT_CLINIC_OR_DEPARTMENT_OTHER): Payer: Medicaid Other | Admitting: Internal Medicine

## 2014-12-22 DIAGNOSIS — G4733 Obstructive sleep apnea (adult) (pediatric): Secondary | ICD-10-CM

## 2014-12-22 NOTE — Progress Notes (Signed)
  Patient Name: Patricia, Ramsey Date: 12/16/2014 Gender: Female D.O.B: April 19, 1966 Age (years): 8 Referring Provider: Lucianne Lei, MD Height (inches): 63 Interpreting Physician: Baird Lyons MD, ABSM Weight (lbs): 230 RPSGT: Madelon Lips BMI: 41 MRN: [id number] Neck Size: 17.00 CLINICAL INFORMATION Sleep Study Type: NPSG   Indication for sleep study: OSA  Epworth Sleepiness Score: 17  MEDICATIONS Medications taken by the patient :Charted for review Medications administered by patient during sleep study : Sleep medicine administered - None SLEEP STUDY TECHNIQUE As per the AASM Manual for the Scoring of Sleep and Associated Events v2.3 (April 2016) with a hypopnea requiring 4% desaturations.  The channels recorded and monitored were frontal, central and occipital EEG, electrooculogram (EOG), submentalis EMG (chin), nasal and oral airflow, thoracic and abdominal wall motion, anterior tibialis EMG, snore microphone, electrocardiogram, and pulse oximetry.  RESPIRATORY PARAMETERS There were a total of 75 respiratory disturbances out of which 1 were apneas ( 1 obstructive, 0 mixed, 0 central) and 74 hypopneas. The apnea/hypopnea index (AHI) was 15.9 events/hour. The central sleep apnea index was 0.0 events/hour. The REM AHI was 51.8 events/hour and NREM AHI was 12.9 events/hour. The supine AHI was 1.7 events/hour and the non supine AHI was 20.84 supine during 25.65% of sleep. Respiratory disturbances were associated with oxygen desaturation down to a nadir of 59.00% during sleep. The mean oxygen saturation during the study was 90.07%.  SLEEP ARCHITECTURE The study was initiated at 10:19:51 PM and terminated at 4:48:17 AM. The total recorded time was 388.4 minutes. EEG confirmed total sleep time was 282.6 minutes yielding a sleep efficiency of 72.8%. Sleep onset after lights out was 66.8 minutes with a REM latency of 77.5 minutes. The patient spent 18.40% of the night in stage N1  sleep, 73.82% in stage N2 sleep, 0.00% in stage N3 and 7.78% in REM. Wake after sleep onset (WASO) was 39.0 minutes. The Arousal Index was 7.4/hour. Split Night criteria was not met.  LEG MOVEMENT DATA The total Periodic Limb Movements of Sleep (PLMS) were 0. The PLMS index was 0.00 .  CARDIAC DATA The 2 lead EKG demonstrated sinus rhythm. The mean heart rate was 76.78 beats per minute. Other EKG findings include: None.  IMPRESSIONS Moderate obstructive sleep apnea occurred during this study (AHI = 15.9/hour). There were not enough early events to permit application of split protocol CPAP titration on this study No significant central sleep apnea occurred during this study (CAI = 0.0). Severe oxygen desaturation was noted during this study (Min O2 = 59.00).          Total sleep time with O2 saturation =< 88% was 35.6 miinutes on room air. The patient snored with Loud snoring volume during the diagnostic portion of the study. No cardiac abnormalities were noted during this study. Clinically significant periodic limb movements did not occur during sleep.    DIAGNOSIS Obstructive Sleep Apnea (327.23 [G47.33 ICD-10]) Nocturnal Hypoxemia (327.26 [G47.36 ICD-10])  RECOMMENDATIONS  Therapeutic CPAP titration to determine optimal pressure required to alleviate sleep disordered breathing. Avoid alcohol, sedatives and other CNS depressants that may worsen sleep apnea and disrupt normal sleep architecture. Sleep hygiene should be reviewed to assess factors that may improve sleep quality. Weight management and regular exercise should be initiated or continued.   [name] Diplomate, American Board of Sleep Medicine  ELECTRONICALLY SIGNED ON:  12/22/2014, 12:13 PM [place] PH: [phone]   FX: [phone] [place]

## 2015-02-22 ENCOUNTER — Ambulatory Visit (INDEPENDENT_AMBULATORY_CARE_PROVIDER_SITE_OTHER): Payer: Medicaid Other | Admitting: Internal Medicine

## 2015-02-22 ENCOUNTER — Encounter: Payer: Self-pay | Admitting: Internal Medicine

## 2015-02-22 ENCOUNTER — Other Ambulatory Visit (INDEPENDENT_AMBULATORY_CARE_PROVIDER_SITE_OTHER): Payer: Medicaid Other

## 2015-02-22 VITALS — BP 110/76 | HR 100 | Ht 63.0 in | Wt 249.0 lb

## 2015-02-22 DIAGNOSIS — R0600 Dyspnea, unspecified: Secondary | ICD-10-CM | POA: Diagnosis not present

## 2015-02-22 DIAGNOSIS — Z23 Encounter for immunization: Secondary | ICD-10-CM | POA: Diagnosis not present

## 2015-02-22 DIAGNOSIS — I2699 Other pulmonary embolism without acute cor pulmonale: Secondary | ICD-10-CM

## 2015-02-22 LAB — BASIC METABOLIC PANEL
BUN: 16 mg/dL (ref 6–23)
CHLORIDE: 98 meq/L (ref 96–112)
CO2: 36 mEq/L — ABNORMAL HIGH (ref 19–32)
CREATININE: 0.91 mg/dL (ref 0.40–1.20)
Calcium: 9.9 mg/dL (ref 8.4–10.5)
GFR: 83.83 mL/min (ref >60.00)
Glucose, Bld: 85 mg/dL (ref 70–99)
Potassium: 3.7 mEq/L (ref 3.5–5.1)
SODIUM: 139 meq/L (ref 135–145)

## 2015-02-22 MED ORDER — FAMOTIDINE 20 MG PO TABS: ORAL_TABLET | ORAL | Status: DC

## 2015-02-22 MED ORDER — PANTOPRAZOLE SODIUM 40 MG PO TBEC: 40.0000 mg | DELAYED_RELEASE_TABLET | Freq: Every day | ORAL | Status: DC

## 2015-02-22 NOTE — Progress Notes (Signed)
Subjective:    Patient ID: Patricia Ramsey, female    DOB: 01-Mar-1966     MRN: [id number]  HPI  1 yobf never smoker with h/o asthma seasonal problems prn saba maybe once a week  then fell and R knee injury at wt 220 then surgery September 21 2014 [facility] then severe sob blood clot Oct 09 2014 R leg and both PE >  Still sob and L cp   02/22/2015 1st [facility] office visit/ Wert   Chief Complaint  Patient presents with  . Pulmonary Consult    Self referral. Pt c/o SOB and left side pain since dx of PE in May 2016. She gets out of breath walking to the mailbox. She also c/o cough- non prod "but sometimes I taste blood".   says was seen multiple times for cp and sob before dx of PE on May 10 but nothing to support this in EPIC and says only 50% better sob and no better L cp which is more positional than pleuritic, hurts worse L side down.  Was very inactive for months before knee surgery and the last time she could walk to mailbox s sob was prior to the knee injury in oct 2015   Says asthma poorly controlled for months with dry day > noct cough and sense of wheezing on singulair and freq saba   No obvious day to day or daytime variability or assoc  chest tightness, subjective wheeze or overt sinus or hb symptoms. No unusual exp hx or h/o childhood pna/ asthma or knowledge of premature birth.  Sleeping ok without nocturnal  or early am exacerbation  of respiratory  c/o's or need for noct saba. Also denies any obvious fluctuation of symptoms with weather or environmental changes or other aggravating or alleviating factors except as outlined above   Current Medications, Allergies, Complete Past Medical History, Past Surgical History, Family History, and Social History were reviewed in [employer] record.        Review of Systems  Constitutional: Negative for fever, chills and unexpected weight change.  HENT: Negative for congestion, dental problem, ear pain,  nosebleeds, postnasal drip, rhinorrhea, sinus pressure, sneezing, sore throat, trouble swallowing and voice change.   Eyes: Negative for visual disturbance.  Respiratory: Positive for cough and shortness of breath. Negative for choking.   Cardiovascular: Positive for chest pain. Negative for leg swelling.  Gastrointestinal: Negative for vomiting, abdominal pain and diarrhea.  Genitourinary: Negative for difficulty urinating.  Musculoskeletal: Negative for arthralgias.  Skin: Negative for rash.  Neurological: Negative for tremors, syncope and headaches.  Hematological: Does not bruise/bleed easily.       Objective:   Physical Exam  amb bf nad  Wt Readings from Last 3 Encounters:  02/22/15 249 lb (112.946 kg)  12/16/14 230 lb (104.327 kg)  10/09/14 247 lb 12.8 oz (112.4 kg)    Vital signs reviewed    HEENT: nl dentition, turbinates, and orophanx. Nl external ear canals without cough reflex   NECK :  without JVD/Nodes/TM/ nl carotid upstrokes bilaterally   LUNGS: no acc muscle use, clear to A and P bilaterally without cough on insp or exp maneuvers   CV:  RRR  no s3 or murmur or increase in P2, no edema   ABD:  soft and nontender with nl excursion in the supine position. No bruits or organomegaly, bowel sounds nl  MS:  warm without deformities, calf tenderness, cyanosis or clubbing  SKIN: warm and dry  without lesions    NEURO:  alert, approp, no deficits     I personally reviewed images and agree with radiology impression as follows:   CTa chest Large central and segmental pulmonary emboli bilaterally. Positive for acute PE with CT evidence of right heart strain (RV/LV Ratio = 2) consistent with at least submassive (intermediate risk) PE.       Assessment & Plan:

## 2015-02-22 NOTE — Patient Instructions (Addendum)
Please see patient coordinator before you leave today  to schedule CTa chest and venous dopplers   Pantoprazole (protonix) 40 mg   Take  30-60 min before first meal of the day and Pepcid (famotidine)  20 mg one @  bedtime until return to office - this is the best way to tell whether stomach acid is contributing to your problem.    GERD (REFLUX)  is an extremely common cause of respiratory symptoms just like yours , many times with no obvious heartburn at all.    It can be treated with medication, but also with lifestyle changes including elevation of the head of your bed (ideally with 6 inch  bed blocks),  Smoking cessation, avoidance of late meals, excessive alcohol, and avoid fatty foods, chocolate, peppermint, colas, red wine, and acidic juices such as orange juice.  NO MINT OR MENTHOL PRODUCTS SO NO COUGH DROPS  USE SUGARLESS CANDY INSTEAD (Jolley ranchers or Stover's or Life Savers) or even ice chips will also do - the key is to swallow to prevent all throat clearing. NO OIL BASED VITAMINS - use powdered substitutes.    Please schedule a follow up office visit in 4 weeks, sooner if needed  Late add : echo also and tsh on return

## 2015-02-23 ENCOUNTER — Encounter: Payer: Self-pay | Admitting: Internal Medicine

## 2015-02-23 NOTE — Assessment & Plan Note (Addendum)
02/22/2015  Walked RA x 3 laps @ 185 ft each stopped due to  End of study, moderate  pace, no sob or desat  Or tachycardia  Symptoms are markedly disproportionate to objective findings and not clear this is a lung problem but pt does appear to have difficult airway management issues. DDX of  difficult airways management all start with A and  include Adherence, Ace Inhibitors, Acid Reflux, Active Sinus Disease, Alpha 1 Antitripsin deficiency, Anxiety masquerading as Airways dz,  ABPA,  allergy(esp in young), Aspiration (esp in elderly), Adverse effects of meds,  Active smokers, A bunch of PE's (a small clot burden can't cause this syndrome unless there is already severe underlying pulm or vascular dz with poor reserve) plus two Bs  = Bronchiectasis and Beta blocker use..and one C= CHF   Adherence is always the initial "prime suspect" and is a multilayered concern that requires a "trust but verify" approach in every patient - starting with knowing how to use medications, especially inhalers, correctly, keeping up with refills and understanding the fundamental difference between maintenance and prns vs those medications only taken for a very short course and then stopped and not refilled.   ? Acid (or non-acid) GERD > always difficult to exclude as up to 75% of pts in some series report no assoc GI/ Heartburn symptoms> rec max (24h)  acid suppression and diet restrictions/ reviewed and instructions given in writing.   ? Anxiety related to obesity/ deconditioning  ? A bunch of pe's > very unlikely now on NOAC, but will complete w/u to be sure  ? chf > repeat echo to be sure R Heart pressures have improved  Total time = 13m review case with pt/ discussion/ counseling/ giving and going over instructions (see avs)

## 2015-02-23 NOTE — Assessment & Plan Note (Addendum)
See CT 10/09/14 central clots/ no assoc effusion/infarct apparent  (so no explanation for what appears to be mscp on L) rec repeat 02/22/2015 > Echo 10/09/14 LVEF 65-70%, moderate LVH with severe focal basal septal hypertrophy, diastolic dysfunction, normal LV filling pressure, normal RV size and systolic function, moderate pulmonary hypertension with RVSP 59 mmHg > rec Repeat 02/22/2015  Venous dopplers 10/09/14 > pos R DVT > rec repeat 02/22/2015   I strongly suspect all of the acute abn's related to the massive PE have resolved and she's now dealing with effects of obesity/ deconditioning but note eliquis nor the other NOACs were studied in the severely obese and if there is evidence of any remaining clot at this time she needs to be changed over to coumadin where we can titrate to effect  Discussed in detail all the  indications, usual  risks and alternatives  relative to the benefits with patient who agrees to proceed with proceeding with eval as outlined

## 2015-02-23 NOTE — Assessment & Plan Note (Signed)
Body mass index is 44.12 kg/(m^2).  No results found for: TSH   Contributing to gerd tendency/ doe/reviewed the need and the process to achieve and maintain neg calorie balance > defer f/u primary care including intermittently monitoring thyroid status

## 2015-02-26 ENCOUNTER — Telehealth: Payer: Self-pay | Admitting: *Deleted

## 2015-02-26 DIAGNOSIS — R0600 Dyspnea, unspecified: Secondary | ICD-10-CM

## 2015-02-26 NOTE — Progress Notes (Signed)
Quick Note:  Spoke with pt and notified of results per Dr. Wert. Pt verbalized understanding and denied any questions.  ______ 

## 2015-02-26 NOTE — Telephone Encounter (Signed)
Spoke with the pt and notified of recs per MW She verbalized understanding  Order was sent to [facility]  

## 2015-02-26 NOTE — Telephone Encounter (Signed)
-----   Message from Tanda Rockers, MD sent at 02/23/2015  7:37 AM EDT ----- After review of records, she needs echo too - see if can do all these studies same day

## 2015-02-28 ENCOUNTER — Ambulatory Visit (HOSPITAL_COMMUNITY): Payer: Medicaid Other | Attending: Cardiology

## 2015-02-28 ENCOUNTER — Other Ambulatory Visit: Payer: Self-pay

## 2015-02-28 DIAGNOSIS — Z8249 Family history of ischemic heart disease and other diseases of the circulatory system: Secondary | ICD-10-CM | POA: Insufficient documentation

## 2015-02-28 DIAGNOSIS — R0600 Dyspnea, unspecified: Secondary | ICD-10-CM | POA: Diagnosis not present

## 2015-03-01 ENCOUNTER — Ambulatory Visit (INDEPENDENT_AMBULATORY_CARE_PROVIDER_SITE_OTHER)
Admission: RE | Admit: 2015-03-01 | Discharge: 2015-03-01 | Disposition: A | Discharge status: Home / Self Care | Payer: Medicaid Other | Source: Ambulatory Visit | Attending: Internal Medicine | Admitting: Internal Medicine

## 2015-03-01 ENCOUNTER — Ambulatory Visit (HOSPITAL_COMMUNITY)
Admission: RE | Admit: 2015-03-01 | Discharge: 2015-03-01 | Disposition: A | Discharge status: Home / Self Care | Payer: Medicaid Other | Source: Ambulatory Visit | Attending: Cardiology | Admitting: Cardiology

## 2015-03-01 ENCOUNTER — Telehealth: Payer: Self-pay | Admitting: Internal Medicine

## 2015-03-01 DIAGNOSIS — M7989 Other specified soft tissue disorders: Secondary | ICD-10-CM | POA: Insufficient documentation

## 2015-03-01 DIAGNOSIS — I10 Essential (primary) hypertension: Secondary | ICD-10-CM | POA: Insufficient documentation

## 2015-03-01 DIAGNOSIS — R0600 Dyspnea, unspecified: Secondary | ICD-10-CM

## 2015-03-01 DIAGNOSIS — I2699 Other pulmonary embolism without acute cor pulmonale: Secondary | ICD-10-CM

## 2015-03-01 MED ORDER — IOHEXOL 350 MG/ML SOLN
80.0000 mL | Freq: Once | INTRAVENOUS | Status: AC | PRN
  Administered 2015-03-01: 80 mL via INTRAVENOUS

## 2015-03-01 NOTE — Telephone Encounter (Signed)
I spoke with patient about results and she verbalized understanding and had no questions 

## 2015-03-01 NOTE — Telephone Encounter (Signed)
Ct Angio Chest Pe W/cm &/or Wo Cm  03/01/2015   CLINICAL DATA:  Personal history of pulmonary embolus. Dyspnea. Knee surgery.  EXAM: CT ANGIOGRAPHY CHEST WITH CONTRAST  TECHNIQUE: Multidetector CT imaging of the chest was performed using the standard protocol during bolus administration of intravenous contrast. Multiplanar CT image reconstructions and MIPs were obtained to evaluate the vascular anatomy.  CONTRAST:  4mL OMNIPAQUE IOHEXOL 350 MG/ML SOLN  COMPARISON:  CTA chest 10/09/2014.  FINDINGS: Previously noted pulmonary emboli have resolved. Pulmonary arterial opacification the satisfactory. No focal filling defects are evident. A heart size is normal. No significant mediastinal or axillary adenopathy is present.  The thoracic inlet is within normal limits. The soft this is unremarkable. Limited imaging of the upper abdomen is within normal limits.  Linear atelectasis is noted in the right middle lobe and lingula. Mild dependent atelectasis is noted bilaterally.  The bone windows are unremarkable.  Review of the MIP images confirms the above findings.  IMPRESSION: Interval resolution of previously noted pulmonary emboli.  Bilateral subsegmental atelectasis.  No other acute or focal disease.   Electronically Signed   By: San Morelle M.D.   On: 03/01/2015 10:33    Please tell pt that PE's have resolved.    Will route to Dr. Melvyn Novas to be aware of findings.

## 2015-03-01 NOTE — Progress Notes (Signed)
Quick Note:  Spoke with pt and notified of results per Dr. Wert. Pt verbalized understanding and denied any questions.  ______ 

## 2015-03-01 NOTE — Telephone Encounter (Signed)
CTa results are in epic. Will forward to DOD to advise on results as MW is off. Please advise Dr. Halford Chessman thanks

## 2015-03-04 NOTE — Telephone Encounter (Signed)
Pt returned call can be reached @ [phone].Patricia Ramsey

## 2015-03-04 NOTE — Telephone Encounter (Signed)
Called and spoke to pt. Pt stated she received a VM from our office. Advised pt that at this time there is nothing indicating we were trying to reach her. Pt verbalized understanding. Nothing further needed at this time.

## 2015-03-07 NOTE — Progress Notes (Signed)
Quick Note:  Spoke with pt and notified of results per Dr. Wert. Pt verbalized understanding and denied any questions.  ______ 

## 2015-03-25 ENCOUNTER — Ambulatory Visit (INDEPENDENT_AMBULATORY_CARE_PROVIDER_SITE_OTHER): Payer: Medicaid Other | Admitting: Internal Medicine

## 2015-03-25 ENCOUNTER — Encounter: Payer: Self-pay | Admitting: Internal Medicine

## 2015-03-25 VITALS — BP 126/74 | HR 89 | Ht 63.0 in | Wt 248.8 lb

## 2015-03-25 DIAGNOSIS — I2699 Other pulmonary embolism without acute cor pulmonale: Secondary | ICD-10-CM | POA: Diagnosis not present

## 2015-03-25 DIAGNOSIS — R0600 Dyspnea, unspecified: Secondary | ICD-10-CM

## 2015-03-25 NOTE — Patient Instructions (Signed)
Weight control is simply a matter of calorie balance which needs to be tilted in your favor by eating less and exercising more.  To get the most out of exercise, you need to be continuously aware that you are short of breath, but never out of breath, for 30 minutes daily. As you improve, it will actually be easier for you to do the same amount of exercise  in  30 minutes so always push to the level where you are short of breath.  If this does not result in gradual weight reduction then I strongly recommend you see a nutritionist with a food diary x 2 weeks so that we can work out a negative calorie balance which is universally effective in steady weight loss programs.  Think of your calorie balance like you do your bank account where in this case you want the balance to go down so you must take in less calories than you burn up.  It's just that simple:  Hard to do, but easy to understand.  Good luck!   In terms of your medications,  I would stay on the reflux meds x 3 months total then defer to Dr Criss Rosales whether to continue  In terms of the eliquis, your weight puts you at increased risk for clots so I would consider the intermediate dose after you complete 6 months and take this for up to 3 years while you work on your weight.  Pulmonary follow up is as needed

## 2015-03-25 NOTE — Progress Notes (Signed)
Subjective:    Patient ID: Patricia Ramsey, female    DOB: 1966-02-28     MRN: [id number]    Brief patient profile:  50 yobf never smoker with h/o asthma seasonal problems prn saba maybe once a week  then fell and R knee injury at wt 220 then surgery September 21 2014 [facility] then severe sob  Oct 09 2014 dx dvt  R leg and   PE >  Still sob and L cp > eval in pulmonary clinic for the first time 02/22/15    History of Present Illness  02/22/2015 1st [facility] office visit/ Patricia Ramsey   Chief Complaint  Patient presents with  . Pulmonary Consult    Self referral. Pt c/o SOB and left side pain since dx of PE in May 2016. She gets out of breath walking to the mailbox. She also c/o cough- non prod "but sometimes I taste blood".   says was seen multiple times for cp and sob before dx of PE on May 10 but nothing to support this in EPIC and says only 50% better sob and no better L cp which is more positional than pleuritic, hurts worse L side down.  Was very inactive for months before knee surgery and the last time she could walk to mailbox s sob was prior to the knee injury in oct 2015   Says asthma poorly controlled for months with dry day > noct cough and sense of wheezing on singulair and freq saba  rec Please see patient coordinator before you leave today  to schedule CTa chest and venous dopplers >   Repeat 03/01/15 : No evidence of right lower extremity deep or superficial acute or residual venous thrombus or incompetence. Cta repeat 03/01/2015 > Interval resolution of previously noted pulmonary emb Pantoprazole (protonix) 40 mg   Take  30-60 min before first meal of the day and Pepcid (famotidine)  20 mg one @  bedtime until return to office - this is the best way to tell whether stomach acid is contributing to your problem.   GERD diet  Late add : echo Repeat 02/28/15 wnl    03/25/2015  f/u ov/Patricia Ramsey re: dyspnea/ cough PE may 2016 on eliquis  Chief Complaint  Patient presents with  . Follow-up      Dyspnea. Pt c/o of occasional cough. Pt states that her dyspnea has improved.      Tolerating more activity but still sob > slow paced walking/ mostly daytime, mostly dry coughing> not needing any form of saba at this point   No obvious day to day or daytime variability or assoc  cp or chest tightness, subjective wheeze or overt sinus or hb symptoms. No unusual exp hx or h/o childhood pna/ asthma or knowledge of premature birth.  Sleeping ok without nocturnal  or early am exacerbation  of respiratory  c/o's or need for noct saba. Also denies any obvious fluctuation of symptoms with weather or environmental changes or other aggravating or alleviating factors except as outlined above   Current Medications, Allergies, Complete Past Medical History, Past Surgical History, Family History, and Social History were reviewed in [employer] record.  ROS  The following are not active complaints unless bolded sore throat, dysphagia, dental problems, itching, sneezing,  nasal congestion or excess/ purulent secretions, ear ache,   fever, chills, sweats, unintended wt loss, classically pleuritic or exertional cp, hemoptysis,  orthopnea pnd or leg swelling, presyncope, palpitations, abdominal pain, anorexia, nausea, vomiting, diarrhea  or change in bowel or bladder habits, change in stools or urine, dysuria,hematuria,  rash, arthralgias, visual complaints, headache, numbness, weakness or ataxia or problems with walking or coordination,  change in mood/affect or memory.              Objective:   Physical Exam  amb obese bf nad  03/25/2015      249  Wt Readings from Last 3 Encounters:  02/22/15 249 lb (112.946 kg)  12/16/14 230 lb (104.327 kg)  10/09/14 247 lb 12.8 oz (112.4 kg)    Vital signs reviewed    HEENT: nl dentition, turbinates, and orophanx. Nl external ear canals without cough reflex   NECK :  without JVD/Nodes/TM/ nl carotid upstrokes bilaterally   LUNGS: no  acc muscle use, clear to A and P bilaterally without cough on insp or exp maneuvers   CV:  RRR  no s3 or murmur or increase in P2, no edema   ABD:  soft and nontender with nl excursion in the supine position. No bruits or organomegaly, bowel sounds nl  MS:  warm without deformities, calf tenderness, cyanosis or clubbing  SKIN: warm and dry without lesions    NEURO:  alert, approp, no deficits     I personally reviewed images and agree with radiology impression as follows:   CTa chest  03/01/15 Interval resolution of previously noted pulmonary emboli. Bilateral subsegmental atelectasis. No other acute or focal disease       Assessment & Plan:   Outpatient Encounter Prescriptions as of 03/25/2015  Medication Sig  . albuterol (PROVENTIL HFA;VENTOLIN HFA) 108 (90 BASE) MCG/ACT inhaler Inhale 2 puffs into the lungs every 6 (six) hours as needed. For shortness of breath  . albuterol (PROVENTIL) (2.5 MG/3ML) 0.083% nebulizer solution Take 3 mLs (2.5 mg total) by nebulization every 6 (six) hours as needed for wheezing.  Marland Kitchen apixaban (ELIQUIS) 5 MG TABS tablet Take 1 tablet (5 mg total) by mouth 2 (two) times daily.  . famotidine (PEPCID) 20 MG tablet One at bedtime  . gabapentin (NEURONTIN) 300 MG capsule Take 1 capsule (300 mg total) by mouth 3 (three) times daily.  . hydrochlorothiazide (HYDRODIURIL) 25 MG tablet Take 25 mg by mouth daily.  . montelukast (SINGULAIR) 10 MG tablet Take 10 mg by mouth at bedtime.  . pantoprazole (PROTONIX) 40 MG tablet Take 1 tablet (40 mg total) by mouth daily. Take 30-60 min before first meal of the day  . sodium chloride (OCEAN) 0.65 % SOLN nasal spray Place 1 spray into both nostrils as needed for congestion.  . [DISCONTINUED] apixaban (ELIQUIS) 5 MG TABS tablet Take 2 tablets (10 mg total) by mouth 2 (two) times daily.   No facility-administered encounter medications on file as of 03/25/2015.

## 2015-03-31 ENCOUNTER — Encounter: Payer: Self-pay | Admitting: Internal Medicine

## 2015-03-31 NOTE — Assessment & Plan Note (Signed)
02/22/2015  Walked RA x 3 laps @ 185 ft each stopped due to  End of study, moderate  pace, no sob or desat  Or tachycardia  Assoc with dry cough which is better on gerd rx and so would rec 3 m rx then try off acid suppression and just on diet/lifestyle changes and Follow up per Primary Care planned

## 2015-03-31 NOTE — Assessment & Plan Note (Signed)
See CT 10/09/14 central clots/ no assoc effusion/infarct apparent  rec repeat 02/22/2015 > Echo 10/09/14 LVEF 65-70%, moderate LVH with severe focal basal septal hypertrophy, diastolic dysfunction, normal LV filling pressure, normal RV size and systolic function, moderate pulmonary hypertension with RVSP 59 mmHg > rec Repeat 02/28/15 wnl  Venous dopplers 10/09/14 > pos R DVT >  Repeat 03/01/15 : No evidence of right lower extremity deep or superficial acute or residual venous thrombus or incompetence. Cta repeat 03/01/2015 > Interval resolution of previously noted pulmonary emboli. - Echo 02/28/15 > nl   I had an extended final summary discussion with the patient reviewing all relevant studies completed to date and  lasting 15 to 20 minutes of a 25 minute visit on the following issues:    1) her weight for the foreseeable future will continue to place her at risk of recurrent dvt/ pe so would consider p 6 m reduction to 2.5 mg bid indefinitely  2) no need for pulmonary f/u at this point   3) I had an extended discussion with the patient reviewing all relevant studies completed to date and  lasting 15 to 20 minutes of a 25 minute visit    Each maintenance medication was reviewed in detail including most importantly the difference between maintenance and prns and under what circumstances the prns are to be triggered using an action plan format that is not reflected in the computer generated alphabetically organized AVS.    Please see instructions for details which were reviewed in writing and the patient given a copy highlighting the part that I personally wrote and discussed at today's ov.

## 2015-03-31 NOTE — Assessment & Plan Note (Signed)
Body mass index is 44.08 kg/(m^2).  No results found for: TSH   Contributing to gerd tendency/ doe/reviewed the need and the process to achieve and maintain neg calorie balance > defer f/u primary care including intermittently monitoring thyroid status

## 2015-06-05 ENCOUNTER — Encounter (HOSPITAL_COMMUNITY): Payer: Self-pay | Admitting: *Deleted

## 2015-06-05 ENCOUNTER — Emergency Department (HOSPITAL_COMMUNITY)
Admission: EM | Admit: 2015-06-05 | Discharge: 2015-06-06 | Disposition: A | Discharge status: Home / Self Care | Payer: Medicaid Other | Attending: Emergency Medicine | Admitting: Emergency Medicine

## 2015-06-05 DIAGNOSIS — Z79899 Other long term (current) drug therapy: Secondary | ICD-10-CM | POA: Insufficient documentation

## 2015-06-05 DIAGNOSIS — Z96659 Presence of unspecified artificial knee joint: Secondary | ICD-10-CM | POA: Diagnosis not present

## 2015-06-05 DIAGNOSIS — Z7902 Long term (current) use of antithrombotics/antiplatelets: Secondary | ICD-10-CM | POA: Insufficient documentation

## 2015-06-05 DIAGNOSIS — I10 Essential (primary) hypertension: Secondary | ICD-10-CM | POA: Insufficient documentation

## 2015-06-05 DIAGNOSIS — G4730 Sleep apnea, unspecified: Secondary | ICD-10-CM | POA: Diagnosis not present

## 2015-06-05 DIAGNOSIS — Z86711 Personal history of pulmonary embolism: Secondary | ICD-10-CM | POA: Diagnosis not present

## 2015-06-05 DIAGNOSIS — Z9981 Dependence on supplemental oxygen: Secondary | ICD-10-CM | POA: Diagnosis not present

## 2015-06-05 DIAGNOSIS — J4531 Mild persistent asthma with (acute) exacerbation: Secondary | ICD-10-CM | POA: Diagnosis not present

## 2015-06-05 DIAGNOSIS — R05 Cough: Secondary | ICD-10-CM | POA: Diagnosis present

## 2015-06-05 DIAGNOSIS — M549 Dorsalgia, unspecified: Secondary | ICD-10-CM | POA: Insufficient documentation

## 2015-06-05 HISTORY — DX: Other pulmonary embolism without acute cor pulmonale: I26.99

## 2015-06-05 NOTE — ED Notes (Signed)
Pt c/o pain between shoulder blades and cough since Thanksgiving. Has been taking OTC medications with relief of symptoms, pain in shoulder blades continues after OTC medications. Hx of pneumonia and PE a year ago, states she still takes blood thinners

## 2015-06-05 NOTE — ED Notes (Signed)
Pt c/o cough and back pain since thanksgiving.

## 2015-06-06 ENCOUNTER — Emergency Department (HOSPITAL_COMMUNITY): Payer: Medicaid Other

## 2015-06-06 MED ORDER — PREDNISONE 20 MG PO TABS: ORAL_TABLET | ORAL | Status: DC

## 2015-06-06 MED ORDER — IPRATROPIUM-ALBUTEROL 0.5-2.5 (3) MG/3ML IN SOLN
3.0000 mL | Freq: Once | RESPIRATORY_TRACT | Status: AC
  Administered 2015-06-06: 3 mL via RESPIRATORY_TRACT
  Filled 2015-06-06: qty 3

## 2015-06-06 MED ORDER — PREDNISONE 20 MG PO TABS
60.0000 mg | ORAL_TABLET | Freq: Once | ORAL | Status: AC
  Administered 2015-06-06: 60 mg via ORAL
  Filled 2015-06-06: qty 3

## 2015-06-06 MED ORDER — GUAIFENESIN-CODEINE 100-10 MG/5ML PO SOLN: 5.0000 mL | Freq: Four times a day (QID) | ORAL | Status: DC | PRN

## 2015-06-06 NOTE — ED Notes (Signed)
Pt to xray

## 2015-06-06 NOTE — Discharge Instructions (Signed)
Asthma, Adult Asthma is a recurring condition in which the airways tighten and narrow. Asthma can make it difficult to breathe. It can cause coughing, wheezing, and shortness of breath. Asthma episodes, also called asthma attacks, range from minor to life-threatening. Asthma cannot be cured, but medicines and lifestyle changes can help control it. CAUSES Asthma is believed to be caused by inherited (genetic) and environmental factors, but its exact cause is unknown. Asthma may be triggered by allergens, lung infections, or irritants in the air. Asthma triggers are different for each person. Common triggers include:   Animal dander.  Dust mites.  Cockroaches.  Pollen from trees or grass.  Mold.  Smoke.  Air pollutants such as dust, household cleaners, hair sprays, aerosol sprays, paint fumes, strong chemicals, or strong odors.  Cold air, weather changes, and winds (which increase molds and pollens in the air).  Strong emotional expressions such as crying or laughing hard.  Stress.  Certain medicines (such as aspirin) or types of drugs (such as beta-blockers).  Sulfites in foods and drinks. Foods and drinks that may contain sulfites include dried fruit, potato chips, and sparkling grape juice.  Infections or inflammatory conditions such as the flu, a cold, or an inflammation of the nasal membranes (rhinitis).  Gastroesophageal reflux disease (GERD).  Exercise or strenuous activity. SYMPTOMS Symptoms may occur immediately after asthma is triggered or many hours later. Symptoms include:  Wheezing.  Excessive nighttime or early morning coughing.  Frequent or severe coughing with a common cold.  Chest tightness.  Shortness of breath. DIAGNOSIS  The diagnosis of asthma is made by a review of your medical history and a physical exam. Tests may also be performed. These may include:  Lung function studies. These tests show how much air you breathe in and out.  Allergy  tests.  Imaging tests such as X-rays. TREATMENT  Asthma cannot be cured, but it can usually be controlled. Treatment involves identifying and avoiding your asthma triggers. It also involves medicines. There are 2 classes of medicine used for asthma treatment:   Controller medicines. These prevent asthma symptoms from occurring. They are usually taken every day.  Reliever or rescue medicines. These quickly relieve asthma symptoms. They are used as needed and provide short-term relief. Your health care provider will help you create an asthma action plan. An asthma action plan is a written plan for managing and treating your asthma attacks. It includes a list of your asthma triggers and how they may be avoided. It also includes information on when medicines should be taken and when their dosage should be changed. An action plan may also involve the use of a device called a peak flow meter. A peak flow meter measures how well the lungs are working. It helps you monitor your condition. HOME CARE INSTRUCTIONS   Take medicines only as directed by your health care provider. Speak with your health care provider if you have questions about how or when to take the medicines.  Use a peak flow meter as directed by your health care provider. Record and keep track of readings.  Understand and use the action plan to help minimize or stop an asthma attack without needing to seek medical care.  Control your home environment in the following ways to help prevent asthma attacks:  Do not smoke. Avoid being exposed to secondhand smoke.  Change your heating and air conditioning filter regularly.  Limit your use of fireplaces and wood stoves.  Get rid of pests (such as roaches   and mice) and their droppings.  Throw away plants if you see mold on them.  Clean your floors and dust regularly. Use unscented cleaning products.  Try to have someone else vacuum for you regularly. Stay out of rooms while they are  being vacuumed and for a short while afterward. If you vacuum, use a dust mask from a hardware store, a double-layered or microfilter vacuum cleaner bag, or a vacuum cleaner with a HEPA filter.  Replace carpet with wood, tile, or vinyl flooring. Carpet can trap dander and dust.  Use allergy-proof pillows, mattress covers, and box spring covers.  Wash bed sheets and blankets every week in hot water and dry them in a dryer.  Use blankets that are made of polyester or cotton.  Clean bathrooms and kitchens with bleach. If possible, have someone repaint the walls in these rooms with mold-resistant paint. Keep out of the rooms that are being cleaned and painted.  Wash hands frequently. SEEK MEDICAL CARE IF:   You have wheezing, shortness of breath, or a cough even if taking medicine to prevent attacks.  The colored mucus you cough up (sputum) is thicker than usual.  Your sputum changes from clear or white to yellow, green, gray, or bloody.  You have any problems that may be related to the medicines you are taking (such as a rash, itching, swelling, or trouble breathing).  You are using a reliever medicine more than 2-3 times per week.  Your peak flow is still at 50-79% of your personal best after following your action plan for 1 hour.  You have a fever. SEEK IMMEDIATE MEDICAL CARE IF:   You seem to be getting worse and are unresponsive to treatment during an asthma attack.  You are short of breath even at rest.  You get short of breath when doing very little physical activity.  You have difficulty eating, drinking, or talking due to asthma symptoms.  You develop chest pain.  You develop a fast heartbeat.  You have a bluish color to your lips or fingernails.  You are light-headed, dizzy, or faint.  Your peak flow is less than 50% of your personal best.   This information is not intended to replace advice given to you by your health care provider. Make sure you discuss any  questions you have with your health care provider.   Document Released: 05/18/2005 Document Revised: 02/06/2015 Document Reviewed: 12/15/2012 Elsevier Interactive Patient Education 2016 [employer]  

## 2015-06-06 NOTE — ED Provider Notes (Signed)
CSN: [id number]     Arrival date & time 06/05/15  2243 History   First MD Initiated Contact with Patient 06/05/15 2315     Chief Complaint  Patient presents with  . Back Pain  . Cough     (Consider location/radiation/quality/duration/timing/severity/associated sxs/prior Treatment) HPI  Patricia Ramsey Is a 50 year old female who presents emergency Department with chief complaint of cough and back pain. Patient states that she has had persistent cough since Thanksgiving of 2016. She is pain in between her shoulder blades. She states it is constant, not worse with movement. The patient states that she has been using over-the-counter cough suppressants, which gave her some relief. However, she continues to have this cough. She denies fevers. She does have a history of asthma. She denies smoking. 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 She states she is taking her medications as directed. She states did not feel the same as her previous pulmonary embolus. She denies any hemoptysis.  Past Medical History  Diagnosis Date  . Asthma   . Diabetes ([place])     boarderline, pt stopped Glucophage  . Hypertension     no meds  . Sleep apnea     has CPAP does not use  . Pulmonary embolism [facility])    Past Surgical History  Procedure Laterality Date  . Tubal ligation    . Lithotripsy    . Oophrectomy    . Ankle surgery    . Knee arthroscopy Right 09/21/2014    Procedure:  right knee arthroscopy;  Surgeon: [name], MD;  Location: [place];  Service: Orthopedics;  Laterality: Right;  . Chondroplasty Right 09/21/2014    Procedure: CHONDROPLASTY right knee;  Surgeon: [name], MD;  Location: [place];  Service: Orthopedics;  Laterality: Right;   No family history on file. Social History  Substance Use Topics  . Smoking status: Never Smoker   . Smokeless tobacco: None  .  Alcohol Use: No   OB History    No data available     Review of Systems   Ten systems reviewed and are negative for acute change, except as noted in the HPI.   Allergies  Review of patient's allergies indicates no known allergies.  Home Medications   Prior to Admission medications   Medication Sig Start Date End Date Taking? Authorizing Provider  albuterol (PROVENTIL HFA;VENTOLIN HFA) 108 (90 BASE) MCG/ACT inhaler Inhale 2 puffs into the lungs every 6 (six) hours as needed. For shortness of breath    Historical Provider, MD  albuterol (PROVENTIL) (2.5 MG/3ML) 0.083% nebulizer solution Take 3 mLs (2.5 mg total) by nebulization every 6 (six) hours as needed for wheezing. 06/05/12   Veryl Speak, MD  apixaban (ELIQUIS) 5 MG TABS tablet Take 1 tablet (5 mg total) by mouth 2 (two) times daily. 10/18/14   Reyne Dumas, MD  famotidine (PEPCID) 20 MG tablet One at bedtime 02/22/15   Tanda Rockers, MD  gabapentin (NEURONTIN) 300 MG capsule Take 1 capsule (300 mg total) by mouth 3 (three) times daily. 10/13/14   Reyne Dumas, MD  guaiFENesin-codeine 100-10 MG/5ML syrup Take 5-10 mLs by mouth every 6 (six) hours as needed for cough. 06/06/15   Margarita Mail, PA-C  hydrochlorothiazide (HYDRODIURIL) 25 MG tablet Take 25 mg by mouth daily. 02/07/15   Historical Provider, MD  montelukast (SINGULAIR) 10 MG tablet Take  10 mg by mouth at bedtime. 02/07/15   Historical Provider, MD  pantoprazole (PROTONIX) 40 MG tablet Take 1 tablet (40 mg total) by mouth daily. Take 30-60 min before first meal of the day 02/22/15   Tanda Rockers, MD  predniSONE (DELTASONE) 20 MG tablet 3 tabs po daily x 3 days, then 2 tabs x 3 days, then 1.5 tabs x 3 days, then 1 tab x 3 days, then 0.5 tabs x 3 days 06/06/15   Margarita Mail, PA-C  sodium chloride (OCEAN) 0.65 % SOLN nasal spray Place 1 spray into both nostrils as needed for congestion. 10/13/14   Reyne Dumas, MD   BP 144/94 mmHg  Pulse 91  Temp(Src) 98.3 F (36.8 C) (Oral)   Resp 16  SpO2 97% Physical Exam  Constitutional: She is oriented to person, place, and time. She appears well-developed and well-nourished. No distress.  HENT:  Head: Normocephalic and atraumatic.  Eyes: Conjunctivae are normal. No scleral icterus.  Neck: Normal range of motion.  Cardiovascular: Normal rate, regular rhythm and normal heart sounds.  Exam reveals no gallop and no friction rub.   No murmur heard. Pulmonary/Chest: Effort normal and breath sounds normal. No respiratory distress.  Abdominal: Soft. Bowel sounds are normal. She exhibits no distension and no mass. There is no tenderness. There is no guarding.  Neurological: She is alert and oriented to person, place, and time.  Skin: Skin is warm and dry. She is not diaphoretic.    ED Course  Procedures (including critical care time) Labs Review Labs Reviewed - No data to display  Imaging Review No results found. I have personally reviewed and evaluated these images and lab results as part of my medical decision-making.   EKG Interpretation None      MDM   Final diagnoses:  Asthma, mild persistent, with acute exacerbation    Patient with 1+  Month of cough.  Treated here with albuterol and steroid. Patient improved with treatment I do not feel there is any concern for PE as the sxs have been persistent for greater than 1 month with no acute change. She has been compliant with Eliquis.  Patient states that she is feeling much bvetter with treatment and appears sage for discharge.     Margarita Mail, PA-C 06/08/15 [place], MD 06/09/15 [phone]

## 2015-06-21 ENCOUNTER — Emergency Department (HOSPITAL_COMMUNITY)
Admission: EM | Admit: 2015-06-21 | Discharge: 2015-06-21 | Disposition: A | Discharge status: Home / Self Care | Payer: Medicaid Other | Attending: Emergency Medicine | Admitting: Emergency Medicine

## 2015-06-21 ENCOUNTER — Emergency Department (HOSPITAL_COMMUNITY): Payer: Medicaid Other

## 2015-06-21 ENCOUNTER — Encounter (HOSPITAL_COMMUNITY): Payer: Self-pay | Admitting: Emergency Medicine

## 2015-06-21 DIAGNOSIS — J4541 Moderate persistent asthma with (acute) exacerbation: Secondary | ICD-10-CM

## 2015-06-21 DIAGNOSIS — J0110 Acute frontal sinusitis, unspecified: Secondary | ICD-10-CM | POA: Diagnosis not present

## 2015-06-21 DIAGNOSIS — E119 Type 2 diabetes mellitus without complications: Secondary | ICD-10-CM | POA: Diagnosis not present

## 2015-06-21 DIAGNOSIS — Z8669 Personal history of other diseases of the nervous system and sense organs: Secondary | ICD-10-CM | POA: Insufficient documentation

## 2015-06-21 DIAGNOSIS — Z79899 Other long term (current) drug therapy: Secondary | ICD-10-CM | POA: Insufficient documentation

## 2015-06-21 DIAGNOSIS — I10 Essential (primary) hypertension: Secondary | ICD-10-CM | POA: Insufficient documentation

## 2015-06-21 DIAGNOSIS — R05 Cough: Secondary | ICD-10-CM | POA: Diagnosis present

## 2015-06-21 DIAGNOSIS — Z86711 Personal history of pulmonary embolism: Secondary | ICD-10-CM | POA: Diagnosis not present

## 2015-06-21 LAB — CBC WITH DIFFERENTIAL/PLATELET
BASOS ABS: 0 10*3/uL (ref 0.0–0.1)
BASOS PCT: 0 %
Eosinophils Absolute: 0.5 10*3/uL (ref 0.0–0.7)
Eosinophils Relative: 7 %
HEMATOCRIT: 45.3 % (ref 36.0–46.0)
Hemoglobin: 14.9 g/dL (ref 12.0–15.0)
Lymphocytes Relative: 34 %
Lymphs Abs: 2.3 10*3/uL (ref 0.7–4.0)
MCH: 28.9 pg (ref 26.0–34.0)
MCHC: 32.9 g/dL (ref 30.0–36.0)
MCV: 87.8 fL (ref 78.0–100.0)
MONO ABS: 0.8 10*3/uL (ref 0.1–1.0)
Monocytes Relative: 12 %
NEUTROS ABS: 3.2 10*3/uL (ref 1.7–7.7)
NEUTROS PCT: 47 %
PLATELETS: 272 10*3/uL (ref 150–400)
RBC: 5.16 MIL/uL — AB (ref 3.87–5.11)
RDW: 15.4 % (ref 11.5–15.5)
WBC: 6.9 10*3/uL (ref 4.0–10.5)

## 2015-06-21 LAB — COMPREHENSIVE METABOLIC PANEL
ALBUMIN: 3.4 g/dL — AB (ref 3.5–5.0)
ALT: 20 U/L (ref 14–54)
AST: 26 U/L (ref 15–41)
Alkaline Phosphatase: 73 U/L (ref 38–126)
Anion gap: 12 (ref 5–15)
BILIRUBIN TOTAL: 0.4 mg/dL (ref 0.3–1.2)
BUN: 9 mg/dL (ref 6–20)
CHLORIDE: 99 mmol/L — AB (ref 101–111)
CO2: 27 mmol/L (ref 22–32)
Calcium: 9.4 mg/dL (ref 8.9–10.3)
Creatinine, Ser: 1.05 mg/dL — ABNORMAL HIGH (ref 0.44–1.00)
GFR calc Af Amer: >60 mL/min (ref >60)
GFR calc non Af Amer: >60 mL/min (ref >60)
GLUCOSE: 132 mg/dL — AB (ref 65–99)
POTASSIUM: 3.3 mmol/L — AB (ref 3.5–5.1)
Sodium: 138 mmol/L (ref 135–145)
Total Protein: 7.1 g/dL (ref 6.5–8.1)

## 2015-06-21 MED ORDER — PREDNISONE 20 MG PO TABS
60.0000 mg | ORAL_TABLET | Freq: Every day | ORAL | Status: DC
  Administered 2015-06-21: 60 mg via ORAL
  Filled 2015-06-21: qty 3

## 2015-06-21 MED ORDER — PREDNISONE 10 MG PO TABS: ORAL_TABLET | ORAL | Status: DC

## 2015-06-21 MED ORDER — ALBUTEROL SULFATE (2.5 MG/3ML) 0.083% IN NEBU: 2.5000 mg | INHALATION_SOLUTION | Freq: Four times a day (QID) | RESPIRATORY_TRACT | Status: DC | PRN

## 2015-06-21 MED ORDER — ALBUTEROL (5 MG/ML) CONTINUOUS INHALATION SOLN
10.0000 mg/h | INHALATION_SOLUTION | RESPIRATORY_TRACT | Status: DC
  Administered 2015-06-21: 10 mg/h via RESPIRATORY_TRACT
  Filled 2015-06-21: qty 20

## 2015-06-21 MED ORDER — AZITHROMYCIN 250 MG PO TABS: 250.0000 mg | ORAL_TABLET | Freq: Every day | ORAL | Status: DC

## 2015-06-21 MED ORDER — ALBUTEROL SULFATE (2.5 MG/3ML) 0.083% IN NEBU
5.0000 mg | INHALATION_SOLUTION | Freq: Once | RESPIRATORY_TRACT | Status: AC
  Administered 2015-06-21: 5 mg via RESPIRATORY_TRACT

## 2015-06-21 MED ORDER — ALBUTEROL SULFATE (2.5 MG/3ML) 0.083% IN NEBU
INHALATION_SOLUTION | RESPIRATORY_TRACT | Status: AC
  Filled 2015-06-21: qty 6

## 2015-06-21 NOTE — ED Provider Notes (Signed)
CSN: [id number]     Arrival date & time 06/21/15  1239 History   First MD Initiated Contact with Patient 06/21/15 1552     Chief Complaint  Patient presents with  . Asthma     (Consider location/radiation/quality/duration/timing/severity/associated sxs/prior Treatment) Patient is a 50 y.o. female presenting with asthma.  Asthma This is a new problem. The current episode started yesterday. The problem occurs constantly. The problem has been gradually worsening. Associated symptoms include congestion and coughing. Nothing aggravates the symptoms. She has tried nothing for the symptoms. The treatment provided moderate relief.   Pt complains of head congestion.  Pt has been using her albuterol inhaler without relief.  Pt reports her nose is stuffy.  Pt reports she currently does not have nebulizer, Past Medical History  Diagnosis Date  . Asthma   . Diabetes ([place])     boarderline, pt stopped Glucophage  . Hypertension     no meds  . Sleep apnea     has CPAP does not use  . Pulmonary embolism [facility]    Past Surgical History  Procedure Laterality Date  . Tubal ligation    . Lithotripsy    . Oophrectomy    . Ankle surgery    . Knee arthroscopy Right 09/21/2014    Procedure:  right knee arthroscopy;  Surgeon: [name], MD;  Location: [place];  Service: Orthopedics;  Laterality: Right;  . Chondroplasty Right 09/21/2014    Procedure: CHONDROPLASTY right knee;  Surgeon: [name], MD;  Location: [place];  Service: Orthopedics;  Laterality: Right;   No family history on file. Social History  Substance Use Topics  . Smoking status: Never Smoker   . Smokeless tobacco: None  . Alcohol Use: No   OB History    No data available     Review of Systems  HENT: Positive for congestion.   Respiratory: Positive for cough.   All other systems reviewed and are negative.     Allergies  Review of patient's allergies indicates no known  allergies.  Home Medications   Prior to Admission medications   Medication Sig Start Date End Date Taking? Authorizing Provider  apixaban (ELIQUIS) 5 MG TABS tablet Take 1 tablet (5 mg total) by mouth 2 (two) times daily. 10/18/14  Yes Reyne Dumas, MD  famotidine (PEPCID) 20 MG tablet One at bedtime 02/22/15  Yes Tanda Rockers, MD  gabapentin (NEURONTIN) 300 MG capsule Take 1 capsule (300 mg total) by mouth 3 (three) times daily. 10/13/14  Yes Reyne Dumas, MD  hydrochlorothiazide (HYDRODIURIL) 25 MG tablet Take 25 mg by mouth daily. 02/07/15  Yes Historical Provider, MD  montelukast (SINGULAIR) 10 MG tablet Take 10 mg by mouth at bedtime. 02/07/15  Yes Historical Provider, MD  pantoprazole (PROTONIX) 40 MG tablet Take 1 tablet (40 mg total) by mouth daily. Take 30-60 min before first meal of the day 02/22/15  Yes Tanda Rockers, MD  sodium chloride (OCEAN) 0.65 % SOLN nasal spray Place 1 spray into both nostrils as needed for congestion. 10/13/14  Yes Reyne Dumas, MD  albuterol (PROVENTIL) (2.5 MG/3ML) 0.083% nebulizer solution Take 3 mLs (2.5 mg total) by nebulization every 6 (six) hours as needed for wheezing or shortness of breath. 06/21/15   Fransico Meadow, PA-C  albuterol (PROVENTIL) (2.5 MG/3ML) 0.083% nebulizer solution Take 3 mLs (2.5 mg total) by nebulization every 6 (six) hours as needed for wheezing. 06/21/15   Fransico Meadow, PA-C  azithromycin (ZITHROMAX) 250 MG tablet Take 1 tablet (250 mg total) by mouth daily. Take first 2 tablets together, then 1 every day until finished. 06/21/15   Fransico Meadow, PA-C  guaiFENesin-codeine 100-10 MG/5ML syrup Take 5-10 mLs by mouth every 6 (six) hours as needed for cough. 06/06/15   Margarita Mail, PA-C  predniSONE (DELTASONE) 10 MG tablet 6,5,4,3,2,1 taper 06/21/15   Hollace Kinnier Paulla Mcclaskey, PA-C   BP 115/88 mmHg  Pulse 114  Temp(Src) 98.9 F (37.2 C) (Oral)  Resp 22  Ht 5\' 3"  (1.6 m)  Wt 108.863 kg  BMI 42.52 kg/m2  SpO2 92% Physical Exam   Constitutional: She is oriented to person, place, and time. She appears well-developed and well-nourished.  HENT:  Head: Normocephalic.  Eyes: EOM are normal.  Neck: Normal range of motion.  Cardiovascular: Normal rate and normal heart sounds.   Pulmonary/Chest: She has wheezes.  Wheezing all lobes  Abdominal: She exhibits no distension.  Musculoskeletal: Normal range of motion.  Neurological: She is alert and oriented to person, place, and time.  Psychiatric: She has a normal mood and affect.  Nursing note and vitals reviewed.   ED Course  Procedures (including critical care time) Labs Review Labs Reviewed  CBC WITH DIFFERENTIAL/PLATELET - Abnormal; Notable for the following:    RBC 5.16 (*)    All other components within normal limits  COMPREHENSIVE METABOLIC PANEL - Abnormal; Notable for the following:    Potassium 3.3 (*)    Chloride 99 (*)    Glucose, Bld 132 (*)    Creatinine, Ser 1.05 (*)    Albumin 3.4 (*)    All other components within normal limits    Imaging Review Dg Chest 2 View  06/21/2015  CLINICAL DATA:  50 year old female with history of wheezing, shortness breath, abdominal and back pain for the past 2 days. EXAM: CHEST  2 VIEW COMPARISON:  Chest x-ray 06/06/2015. FINDINGS: Lung volumes are normal. No consolidative airspace disease. No pleural effusions. No pneumothorax. No pulmonary nodule or mass noted. Pulmonary vasculature and the cardiomediastinal silhouette are within normal limits. Atherosclerosis in the thoracic aorta. IMPRESSION: 1. No radiographic evidence of acute cardiopulmonary disease. 2. Atherosclerosis. Electronically Signed   By: Vinnie Langton M.D.   On: 06/21/2015 16:45   I have personally reviewed and evaluated these images and lab results as part of my medical decision-making.   EKG Interpretation None      MDM  No relief with duoneb.  Pt given one hour continous neb.  Prednisone 60mg  po.  Pt feels better.  No wheezing.  Pt still  has nasal congestion   Final diagnoses:  Asthma, moderate persistent, with acute exacerbation  Acute frontal sinusitis, recurrence not specified    Pt given rx for prednisone Rx for zithromax and Albuterol nebulization solution RX for neb machine.      [place], PA-C 06/21/15 2232  Forde Dandy, MD 06/22/15 [phone]

## 2015-06-21 NOTE — ED Notes (Signed)
Pt is in stable condition upon d/c and ambulates from ED. 

## 2015-06-21 NOTE — ED Notes (Signed)
Onset one day ago states asthma getting worsening took own inhaler with slight relief continued today with general body achy.

## 2015-06-21 NOTE — Discharge Instructions (Signed)
Asthma Attack Prevention °While you may not be able to control the fact that you have asthma, you can take actions to prevent asthma attacks. The best way to prevent asthma attacks is to maintain good control of your asthma. You can achieve this by: °· Taking your medicines as directed. °· Avoiding things that can irritate your airways or make your asthma symptoms worse (asthma triggers). °· Keeping track of how well your asthma is controlled and of any changes in your symptoms. °· Responding quickly to worsening asthma symptoms (asthma attack). °· Seeking emergency care when it is needed. °WHAT ARE SOME WAYS TO PREVENT AN ASTHMA ATTACK? °Have a Plan °Work with your health care provider to create a written plan for managing and treating your asthma attacks (asthma action plan). This plan includes: °· A list of your asthma triggers and how you can avoid them. °· Information on when medicines should be taken and when their dosages should be changed. °· The use of a device that measures how well your lungs are working (peak flow meter). °Monitor Your Asthma °Use your peak flow meter and record your results in a journal every day. A drop in your peak flow numbers on one or more days may indicate the start of an asthma attack. This can happen even before you start to feel symptoms. You can prevent an asthma attack from getting worse by following the steps in your asthma action plan. °Avoid Asthma Triggers °Work with your asthma health care provider to find out what your asthma triggers are. This can be done by: °· Allergy testing. °· Keeping a journal that notes when asthma attacks occur and the factors that may have contributed to them. °· Determining if there are other medical conditions that are making your asthma worse. °Once you have determined your asthma triggers, take steps to avoid them. This may include avoiding excessive or prolonged exposure to: °· Dust. Have someone dust and vacuum your home for you once or  twice a week. Using a high-efficiency particulate arrestance (HEPA) vacuum is best. °· Smoke. This includes campfire smoke, forest fire smoke, and secondhand smoke from tobacco products. °· Pet dander. Avoid contact with animals that you know you are allergic to. °· Allergens from trees, grasses or pollens. Avoid spending a lot of time outdoors when pollen counts are high, and on very windy days. °· Very cold, dry, or humid air. °· Mold. °· Foods that contain high amounts of sulfites. °· Strong odors. °· Outdoor air pollutants, such as engine exhaust. °· Indoor air pollutants, such as aerosol sprays and fumes from household cleaners. °· Household pests, including dust mites and cockroaches, and pest droppings. °· Certain medicines, including NSAIDs. Always talk to your health care provider before stopping or starting any new medicines. °Medicines °Take over-the-counter and prescription medicines only as told by your health care provider. Many asthma attacks can be prevented by carefully following your medicine schedule. Taking your medicines correctly is especially important when you cannot avoid certain asthma triggers. °Act Quickly °If an asthma attack does happen, acting quickly can decrease how severe it is and how long it lasts. Take these steps:  °· Pay attention to your symptoms. If you are coughing, wheezing, or having difficulty breathing, do not wait to see if your symptoms go away on their own. Follow your asthma action plan. °· If you have followed your asthma action plan and your symptoms are not improving, call your health care provider or seek immediate medical care   at the nearest hospital. It is important to note how often you need to use your fast-acting rescue inhaler. If you are using your rescue inhaler more often, it may mean that your asthma is not under control. Adjusting your asthma treatment plan may help you to prevent future asthma attacks and help you to gain better control of your  condition. HOW CAN I PREVENT AN ASTHMA ATTACK WHEN I EXERCISE? Follow advice from your health care provider about whether you should use your fast-acting inhaler before exercising. Many people with asthma experience exercise-induced bronchoconstriction (EIB). This condition often worsens during vigorous exercise in cold, humid, or dry environments. Usually, people with EIB can stay very active by pre-treating with a fast-acting inhaler before exercising.   This information is not intended to replace advice given to you by your health care provider. Make sure you discuss any questions you have with your health care provider.   Document Released: 05/06/2009 Document Revised: 02/06/2015 Document Reviewed: 10/18/2014 Elsevier Interactive Patient Education 2016 [place].  Asthma, Adult Asthma is a recurring condition in which the airways tighten and narrow. Asthma can make it difficult to breathe. It can cause coughing, wheezing, and shortness of breath. Asthma episodes, also called asthma attacks, range from minor to life-threatening. Asthma cannot be cured, but medicines and lifestyle changes can help control it. CAUSES Asthma is believed to be caused by inherited (genetic) and environmental factors, but its exact cause is unknown. Asthma may be triggered by allergens, lung infections, or irritants in the air. Asthma triggers are different for each person. Common triggers include:   Animal dander.  Dust mites.  Cockroaches.  Pollen from trees or grass.  Mold.  Smoke.  Air pollutants such as dust, household cleaners, hair sprays, aerosol sprays, paint fumes, strong chemicals, or strong odors.  Cold air, weather changes, and winds (which increase molds and pollens in the air).  Strong emotional expressions such as crying or laughing hard.  Stress.  Certain medicines (such as aspirin) or types of drugs (such as beta-blockers).  Sulfites in foods and drinks. Foods and drinks that  may contain sulfites include dried fruit, potato chips, and sparkling grape juice.  Infections or inflammatory conditions such as the flu, a cold, or an inflammation of the nasal membranes (rhinitis).  Gastroesophageal reflux disease (GERD).  Exercise or strenuous activity. SYMPTOMS Symptoms may occur immediately after asthma is triggered or many hours later. Symptoms include:  Wheezing.  Excessive nighttime or early morning coughing.  Frequent or severe coughing with a common cold.  Chest tightness.  Shortness of breath. DIAGNOSIS  The diagnosis of asthma is made by a review of your medical history and a physical exam. Tests may also be performed. These may include:  Lung function studies. These tests show how much air you breathe in and out.  Allergy tests.  Imaging tests such as X-rays. TREATMENT  Asthma cannot be cured, but it can usually be controlled. Treatment involves identifying and avoiding your asthma triggers. It also involves medicines. There are 2 classes of medicine used for asthma treatment:   Controller medicines. These prevent asthma symptoms from occurring. They are usually taken every day.  Reliever or rescue medicines. These quickly relieve asthma symptoms. They are used as needed and provide short-term relief. Your health care provider will help you create an asthma action plan. An asthma action plan is a written plan for managing and treating your asthma attacks. It includes a list of your asthma triggers and how they  may be avoided. It also includes information on when medicines should be taken and when their dosage should be changed. An action plan may also involve the use of a device called a peak flow meter. A peak flow meter measures how well the lungs are working. It helps you monitor your condition. HOME CARE INSTRUCTIONS   Take medicines only as directed by your health care provider. Speak with your health care provider if you have questions about  how or when to take the medicines.  Use a peak flow meter as directed by your health care provider. Record and keep track of readings.  Understand and use the action plan to help minimize or stop an asthma attack without needing to seek medical care.  Control your home environment in the following ways to help prevent asthma attacks:  Do not smoke. Avoid being exposed to secondhand smoke.  Change your heating and air conditioning filter regularly.  Limit your use of fireplaces and wood stoves.  Get rid of pests (such as roaches and mice) and their droppings.  Throw away plants if you see mold on them.  Clean your floors and dust regularly. Use unscented cleaning products.  Try to have someone else vacuum for you regularly. Stay out of rooms while they are being vacuumed and for a short while afterward. If you vacuum, use a dust mask from a hardware store, a double-layered or microfilter vacuum cleaner bag, or a vacuum cleaner with a HEPA filter.  Replace carpet with wood, tile, or vinyl flooring. Carpet can trap dander and dust.  Use allergy-proof pillows, mattress covers, and box spring covers.  Wash bed sheets and blankets every week in hot water and dry them in a dryer.  Use blankets that are made of polyester or cotton.  Clean bathrooms and kitchens with bleach. If possible, have someone repaint the walls in these rooms with mold-resistant paint. Keep out of the rooms that are being cleaned and painted.  Wash hands frequently. SEEK MEDICAL CARE IF:   You have wheezing, shortness of breath, or a cough even if taking medicine to prevent attacks.  The colored mucus you cough up (sputum) is thicker than usual.  Your sputum changes from clear or white to yellow, green, gray, or bloody.  You have any problems that may be related to the medicines you are taking (such as a rash, itching, swelling, or trouble breathing).  You are using a reliever medicine more than 2-3 times per  week.  Your peak flow is still at 50-79% of your personal best after following your action plan for 1 hour.  You have a fever. SEEK IMMEDIATE MEDICAL CARE IF:   You seem to be getting worse and are unresponsive to treatment during an asthma attack.  You are short of breath even at rest.  You get short of breath when doing very little physical activity.  You have difficulty eating, drinking, or talking due to asthma symptoms.  You develop chest pain.  You develop a fast heartbeat.  You have a bluish color to your lips or fingernails.  You are light-headed, dizzy, or faint.  Your peak flow is less than 50% of your personal best.   This information is not intended to replace advice given to you by your health care provider. Make sure you discuss any questions you have with your health care provider.   Document Released: 05/18/2005 Document Revised: 02/06/2015 Document Reviewed: 12/15/2012 Elsevier Interactive Patient Education 2016 [place]. Acute Bronchitis Bronchitis  is inflammation of the airways that extend from the windpipe into the lungs (bronchi). The inflammation often causes mucus to develop. This leads to a cough, which is the most common symptom of bronchitis.  In acute bronchitis, the condition usually develops suddenly and goes away over time, usually in a couple weeks. Smoking, allergies, and asthma can make bronchitis worse. Repeated episodes of bronchitis may cause further lung problems.  CAUSES Acute bronchitis is most often caused by the same virus that causes a cold. The virus can spread from person to person (contagious) through coughing, sneezing, and touching contaminated objects. SIGNS AND SYMPTOMS   Cough.   Fever.   Coughing up mucus.   Body aches.   Chest congestion.   Chills.   Shortness of breath.   Sore throat.  DIAGNOSIS  Acute bronchitis is usually diagnosed through a physical exam. Your health care provider will also ask  you questions about your medical history. Tests, such as chest X-rays, are sometimes done to rule out other conditions.  TREATMENT  Acute bronchitis usually goes away in a couple weeks. Oftentimes, no medical treatment is necessary. Medicines are sometimes given for relief of fever or cough. Antibiotic medicines are usually not needed but may be prescribed in certain situations. In some cases, an inhaler may be recommended to help reduce shortness of breath and control the cough. A cool mist vaporizer may also be used to help thin bronchial secretions and make it easier to clear the chest.  HOME CARE INSTRUCTIONS  Get plenty of rest.   Drink enough fluids to keep your urine clear or pale yellow (unless you have a medical condition that requires fluid restriction). Increasing fluids may help thin your respiratory secretions (sputum) and reduce chest congestion, and it will prevent dehydration.   Take medicines only as directed by your health care provider.  If you were prescribed an antibiotic medicine, finish it all even if you start to feel better.  Avoid smoking and secondhand smoke. Exposure to cigarette smoke or irritating chemicals will make bronchitis worse. If you are a smoker, consider using nicotine gum or skin patches to help control withdrawal symptoms. Quitting smoking will help your lungs heal faster.   Reduce the chances of another bout of acute bronchitis by washing your hands frequently, avoiding people with cold symptoms, and trying not to touch your hands to your mouth, nose, or eyes.   Keep all follow-up visits as directed by your health care provider.  SEEK MEDICAL CARE IF: Your symptoms do not improve after 1 week of treatment.  SEEK IMMEDIATE MEDICAL CARE IF:  You develop an increased fever or chills.   You have chest pain.   You have severe shortness of breath.  You have bloody sputum.   You develop dehydration.  You faint or repeatedly feel like you are  going to pass out.  You develop repeated vomiting.  You develop a severe headache. MAKE SURE YOU:   Understand these instructions.  Will watch your condition.  Will get help right away if you are not doing well or get worse.   This information is not intended to replace advice given to you by your health care provider. Make sure you discuss any questions you have with your health care provider.   Document Released: 06/25/2004 Document Revised: 06/08/2014 Document Reviewed: 11/08/2012 Elsevier Interactive Patient Education [employer].

## 2015-06-24 ENCOUNTER — Ambulatory Visit (INDEPENDENT_AMBULATORY_CARE_PROVIDER_SITE_OTHER): Payer: Medicaid Other | Admitting: Internal Medicine

## 2015-06-24 ENCOUNTER — Telehealth: Payer: Self-pay | Admitting: Internal Medicine

## 2015-06-24 ENCOUNTER — Encounter: Payer: Self-pay | Admitting: Internal Medicine

## 2015-06-24 VITALS — BP 124/80 | HR 96 | Ht 63.0 in | Wt 258.0 lb

## 2015-06-24 DIAGNOSIS — J45991 Cough variant asthma: Secondary | ICD-10-CM | POA: Diagnosis not present

## 2015-06-24 DIAGNOSIS — R0600 Dyspnea, unspecified: Secondary | ICD-10-CM

## 2015-06-24 MED ORDER — APIXABAN 5 MG PO TABS: 5.0000 mg | ORAL_TABLET | Freq: Two times a day (BID) | ORAL | Status: AC

## 2015-06-24 MED ORDER — TRAMADOL HCL 50 MG PO TABS: ORAL_TABLET | ORAL | Status: DC

## 2015-06-24 MED ORDER — MOMETASONE FURO-FORMOTEROL FUM 100-5 MCG/ACT IN AERO: INHALATION_SPRAY | RESPIRATORY_TRACT | Status: DC

## 2015-06-24 MED ORDER — FAMOTIDINE 20 MG PO TABS: ORAL_TABLET | ORAL | Status: DC

## 2015-06-24 NOTE — Telephone Encounter (Signed)
Spoke with pt. Reports increased SOB, chest tightness, wheezing and coughing. Cough is producing clear mucus. Pt also complains of being very weak and fatigued. Went to the ED over the weekend and was treated for an asthma flare up. Has been using her nebulizer with no relief. Denies have a fever. Wants to see MW today.  MW - please advise. Thanks.

## 2015-06-24 NOTE — Telephone Encounter (Signed)
Spoke with pt. She has been scheduled to see MW today 2pm. Will bring her meds with her as instructed by MW. Nothing further was needed.

## 2015-06-24 NOTE — Progress Notes (Signed)
Subjective:    Patient ID: Patricia Ramsey, female    DOB: Jul 30, 1965     MRN: [id number]    Brief patient profile:  82 yobf never smoker with h/o asthma seasonal problems prn saba maybe once a week  then fell and R knee injury at wt 89 then surgery September 21 2014 [facility] then severe sob  Oct 09 2014 dx dvt  R leg and   PE >  Still sob and L cp > eval in pulmonary clinic for the first time 02/22/15    History of Present Illness  02/22/2015 1st [facility] office visit/ Charday Capetillo   Chief Complaint  Patient presents with  . Pulmonary Consult    Self referral. Pt c/o SOB and left side pain since dx of PE in May 2016. She gets out of breath walking to the mailbox. She also c/o cough- non prod "but sometimes I taste blood".   says was seen multiple times for cp and sob before dx of PE on May 10 but nothing to support this in EPIC and says only 50% better sob and no better L cp which is more positional than pleuritic, hurts worse L side down.  Was very inactive for months before knee surgery and the last time she could walk to mailbox s sob was prior to the knee injury in oct 2015   Says asthma poorly controlled for months with dry day > noct cough and sense of wheezing on singulair and freq saba  rec Please see patient coordinator before you leave today  to schedule CTa chest and venous dopplers >   Repeat 03/01/15 : No evidence of right lower extremity deep or superficial acute or residual venous thrombus or incompetence. Cta repeat 03/01/2015 > Interval resolution of previously noted pulmonary emb Pantoprazole (protonix) 40 mg   Take  30-60 min before first meal of the day and Pepcid (famotidine)  20 mg one @  bedtime until return to office - this is the best way to tell whether stomach acid is contributing to your problem.   GERD diet  Late add : echo Repeat 02/28/15 wnl    03/25/2015  f/u ov/Evalyne Cortopassi re: dyspnea/ cough PE may 2016 on eliquis  Chief Complaint  Patient presents with  . Follow-up      Dyspnea. Pt c/o of occasional cough. Pt states that her dyspnea has improved.   tolerating more activity but still sob > slow paced walking/ mostly daytime, mostly dry coughing> not needing any form of saba at this point  rec Work on wt   06/24/2015 acute extended ov/Wiatt Mahabir re: cough/ wheeze / now maint on singulair Chief Complaint  Patient presents with  . Acute Visit    Pt c/o increased SOB and wheezing x 4 weeks. Pt notes clear mucus production with cough; started with yellow. Currently taking Pred taper and Zpak.   onset was in November 2016 while on gerd rx >> gradually wore so went to ER [facility] 06/04/14 > rx nebs/steroids>>  cough improved and felt better but still felt herself  wheezing   despite alb/prednisone > 06/20/14 > some better now   No obvious day to day or daytime variability or assoc  cp or chest tightness, subjective wheeze or overt sinus or hb symptoms. No unusual exp hx or h/o childhood pna/ asthma or knowledge of premature birth.  Sleeping ok without nocturnal  or early am exacerbation  of respiratory  c/o's or need for noct saba. Also denies any  obvious fluctuation of symptoms with weather or environmental changes or other aggravating or alleviating factors except as outlined above   Current Medications, Allergies, Complete Past Medical History, Past Surgical History, Family History, and Social History were reviewed in [employer] record.  ROS  The following are not active complaints unless bolded sore throat, dysphagia, dental problems, itching, sneezing,  nasal congestion or excess/ purulent secretions, ear ache,   fever, chills, sweats, unintended wt loss, classically pleuritic or exertional cp, hemoptysis,  orthopnea pnd or leg swelling, presyncope, palpitations, abdominal pain, anorexia, nausea, vomiting, diarrhea  or change in bowel or bladder habits, change in stools or urine, dysuria,hematuria,  rash, arthralgias, visual complaints, headache,  numbness, weakness or ataxia or problems with walking or coordination,  change in mood/affect or memory.              Objective:   Physical Exam  amb obese bf nad  03/25/2015      249 > 06/24/2015   258     02/22/15 249 lb (112.946 kg)  12/16/14 230 lb (104.327 kg)  10/09/14 247 lb 12.8 oz (112.4 kg)    Vital signs reviewed    HEENT: nl dentition, turbinates, and orophanx. Nl external ear canals without cough reflex   NECK :  without JVD/Nodes/TM/ nl carotid upstrokes bilaterally   LUNGS: no acc muscle use, clear to A and P bilaterally without cough on insp or exp maneuvers   CV:  RRR  no s3 or murmur or increase in P2, no edema   ABD:  soft and nontender with nl excursion in the supine position. No bruits or organomegaly, bowel sounds nl  MS:  warm without deformities, calf tenderness, cyanosis or clubbing  SKIN: warm and dry without lesions    NEURO:  alert, approp, no deficits      I personally reviewed images and agree with radiology impression as follows:  CXR:  06/21/15 1. No radiographic evidence of acute cardiopulmonary disease. 2. Atherosclerosis.        Assessment & Plan:

## 2015-06-24 NOTE — Patient Instructions (Signed)
Take delsym two tsp every 12 hours and supplement if needed with  tramadol 50 mg up to 2 every 4 hours to suppress the urge to cough. Swallowing water or using ice chips/non mint and menthol containing candies (such as lifesavers or sugarless jolly ranchers) are also effective.  You should rest your voice and avoid activities that you know make you cough.  Once you have eliminated the cough for 3 straight days try reducing the tramadol first,  then the delsym as tolerated.    Increase gabapentin to three times daily   Finish prednisone   Plan A = automatic = dulera 100 Take 2 puffs first thing in am and then another 2 puffs about 12 hours later.   Plan B = Backup Only use your albuterol (proair) as a rescue medication to be used if you can't catch your breath by resting or doing a relaxed purse lip breathing pattern.  - The less you use it, the better it will work when you need it. - Ok to use up to 2 puffs  every 4 hours if you must but call for immediate appointment if use goes up over your usual need - Don't leave home without it !!  (think of it like the spare tire for your car)  Plan C = crisis Only use the albuterol neb if you try the proair and it's doesn't work  Please schedule a follow up office visit in 2 weeks, sooner if needed

## 2015-06-24 NOTE — Telephone Encounter (Signed)
Needs ov asap Instruct:  When return bring your medications in 2 separate bags, the ones you take no matter(automatically)  what vs the as needed (only when you feel you need them)

## 2015-06-30 ENCOUNTER — Encounter: Payer: Self-pay | Admitting: Internal Medicine

## 2015-06-30 NOTE — Assessment & Plan Note (Signed)
Body mass index is 45.71    No results found for: TSH   Contributing to gerd tendency/ doe/reviewed the need and the process to achieve and maintain neg calorie balance > defer f/u primary care including intermittently monitoring thyroid status

## 2015-06-30 NOTE — Assessment & Plan Note (Addendum)
06/24/2015  extensive coaching HFA effectiveness =    75% > try dulera 100 2bid  The most common causes of chronic cough in immunocompetent adults include the following: upper airway cough syndrome (UACS), previously referred to as postnasal drip syndrome (PNDS), which is caused by variety of rhinosinus conditions; (2) asthma; (3) GERD; (4) chronic bronchitis from cigarette smoking or other inhaled environmental irritants; (5) nonasthmatic eosinophilic bronchitis; and (6) bronchiectasis.   These conditions, singly or in combination, have accounted for up to 94% of the causes of chronic cough in prospective studies.   Other conditions have constituted no >6% of the causes in prospective studies These have included bronchogenic carcinoma, chronic interstitial pneumonia, sarcoidosis, left ventricular failure, ACEI-induced cough, and aspiration from a condition associated with pharyngeal dysfunction.    Chronic cough is often simultaneously caused by more than one condition. A single cause has been found from 38 to 82% of the time, multiple causes from 18 to 62%. Multiply caused cough has been the result of three diseases up to 42% of the time.      Already on gerd rx/ reinforced she should continue this plus take increase the neurontin (for irritable larynx)  plus start dulera 100 2bid as felt better p saba - if not improved will need full 12 step w/u for chronic cough to included sinus ct/ allergy testing and possibly methacholine challenge.  I had an extended discussion with the patient reviewing all relevant studies completed to date and  lasting 15 to 20 minutes of a 25 minute visit    Each maintenance medication was reviewed in detail including most importantly the difference between maintenance and prns and under what circumstances the prns are to be triggered using an action plan format that is not reflected in the computer generated alphabetically organized AVS.    Please see instructions for  details which were reviewed in writing and the patient given a copy highlighting the part that I personally wrote and discussed at today's ov.

## 2015-07-12 ENCOUNTER — Ambulatory Visit: Payer: Medicaid Other | Admitting: Internal Medicine

## 2015-10-08 ENCOUNTER — Encounter: Payer: Self-pay | Admitting: Adult Health

## 2015-10-08 ENCOUNTER — Ambulatory Visit (INDEPENDENT_AMBULATORY_CARE_PROVIDER_SITE_OTHER): Payer: Medicaid Other | Admitting: Adult Health

## 2015-10-08 VITALS — BP 114/86 | HR 104 | Temp 98.4°F | Ht 63.0 in | Wt 258.0 lb

## 2015-10-08 DIAGNOSIS — J45991 Cough variant asthma: Secondary | ICD-10-CM

## 2015-10-08 DIAGNOSIS — I2699 Other pulmonary embolism without acute cor pulmonale: Secondary | ICD-10-CM | POA: Diagnosis not present

## 2015-10-08 DIAGNOSIS — R0600 Dyspnea, unspecified: Secondary | ICD-10-CM | POA: Diagnosis not present

## 2015-10-08 MED ORDER — TRAMADOL HCL 50 MG PO TABS: ORAL_TABLET | ORAL | Status: DC

## 2015-10-08 MED ORDER — MOMETASONE FURO-FORMOTEROL FUM 100-5 MCG/ACT IN AERO: INHALATION_SPRAY | RESPIRATORY_TRACT | Status: DC

## 2015-10-08 MED ORDER — PANTOPRAZOLE SODIUM 40 MG PO TBEC: 40.0000 mg | DELAYED_RELEASE_TABLET | Freq: Every day | ORAL | Status: DC

## 2015-10-08 MED ORDER — PREDNISONE 10 MG PO TABS: ORAL_TABLET | ORAL | Status: DC

## 2015-10-08 MED ORDER — FAMOTIDINE 20 MG PO TABS: ORAL_TABLET | ORAL | Status: DC

## 2015-10-08 MED ORDER — AZITHROMYCIN 250 MG PO TABS: ORAL_TABLET | ORAL | Status: AC

## 2015-10-08 MED ORDER — MOMETASONE FUROATE 50 MCG/ACT NA SUSP: 2.0000 | Freq: Every day | NASAL | Status: DC

## 2015-10-08 MED ORDER — MONTELUKAST SODIUM 10 MG PO TABS: 10.0000 mg | ORAL_TABLET | Freq: Every day | ORAL | Status: DC

## 2015-10-08 NOTE — Assessment & Plan Note (Addendum)
Provoked PE after knee surgery in 09/2014  Cont on Eliquis , discussed length of therapy on return with Dr. Melvyn Novas   Avoid NSAIDS while on Eliquis

## 2015-10-08 NOTE — Assessment & Plan Note (Signed)
Flare with URI  Once better needs spirometry /PFT  Restart cough control rx.  On neurontin for cough , once better try to wean to off.  Control for triggers   Plan  Stop Etodolac. Do not take ibuprofen, aleve, etc. NSAIDS .  Zpack take as directed.  Prednisone taper over next week.  Mucinex DM Twice daily  As needed  Cough/congestion  Restart Protonix and Pepcid.  May use Tramadol As needed  Severe cough , may make you sleepy.  Continue on Dulera 2 puffs Twice daily  , rinse after use.  Add zyrtec 10mg  At bedtime .  Add Nasonex 2 puffs daily  Follow up with Dr. Melvyn Novas  In 6 weeks and As needed   Please contact office for sooner follow up if symptoms do not improve or worsen or seek emergency care

## 2015-10-08 NOTE — Addendum Note (Signed)
Addended by: Osa Craver on: 10/08/2015 09:56 AM   Modules accepted: Orders

## 2015-10-08 NOTE — Progress Notes (Signed)
Subjective:    Patient ID: Patricia Ramsey, female    DOB: 1965/07/28     MRN: [id number]    Brief patient profile:  23 yobf never smoker with h/o asthma seasonal problems prn saba maybe once a week  then fell and R knee injury at wt [address] then surgery September 21 2014 [facility] then severe sob  Oct 09 2014 dx dvt  R leg and   PE >  Still sob and L cp > eval in pulmonary clinic for the first time 02/22/15    History of Present Illness  02/22/2015 1st [facility] office visit/ [facility]   Chief Complaint  Patient presents with  . Pulmonary Consult    Self referral. Pt c/o SOB and left side pain since dx of PE in May 2016. She gets out of breath walking to the mailbox. She also c/o cough- non prod "but sometimes I taste blood".   says was seen multiple times for cp and sob before dx of PE on May 10 but nothing to support this in EPIC and says only 50% better sob and no better L cp which is more positional than pleuritic, hurts worse L side down.  Was very inactive for months before knee surgery and the last time she could walk to mailbox s sob was prior to the knee injury in oct 2015   Says asthma poorly controlled for months with dry day > noct cough and sense of wheezing on singulair and freq saba  rec Please see patient coordinator before you leave today  to schedule CTa chest and venous dopplers >   Repeat 03/01/15 : No evidence of right lower extremity deep or superficial acute or residual venous thrombus or incompetence. Cta repeat 03/01/2015 > Interval resolution of previously noted pulmonary emb Pantoprazole (protonix) 40 mg   Take  30-60 min before first meal of the day and Pepcid (famotidine)  20 mg one @  bedtime until return to office - this is the best way to tell whether stomach acid is contributing to your problem.   GERD diet  Late add : echo Repeat 02/28/15 wnl    03/25/2015  f/u ov/[facility] re: dyspnea/ cough PE may 2016 on eliquis  Chief Complaint  Patient presents with  . Follow-up      Dyspnea. Pt c/o of occasional cough. Pt states that her dyspnea has improved.   tolerating more activity but still sob > slow paced walking/ mostly daytime, mostly dry coughing> not needing any form of saba at this point  rec Work on wt   06/24/2015 acute extended ov/[facility] re: cough/ wheeze / now maint on singulair Chief Complaint  Patient presents with  . Acute Visit    Pt c/o increased SOB and wheezing x 4 weeks. Pt notes clear mucus production with cough; started with yellow. Currently taking Pred taper and Zpak.   onset was in November 2016 while on gerd rx >> gradually wore so went to ER [facility] 06/04/14 > rx nebs/steroids>>  cough improved and felt better but still felt herself  wheezing   despite alb/prednisone > 06/20/14 > some better now  >>finish pred pack , cough regimen   10/08/2015 Acute OV  Pt presents for an acute office visit. Complains of chest congestion/tightness, SOB, wheezing, prod cough with clear mucus, sinus drainage/pressure starting on 10/03/15.  Using more albuterol over last few days. Remains on Dulera and Singulair.  Ran out of PPI /Pepcid , refill ran out.  Denies any  fever, nausea or vomiting, hemoptysis , orthopnea or edema.  Says she was sick in March like this, resolved with otc cold meds. No abx needed.  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   Pt with hx of PE in 2016  On Eliquis  No chest pain, or hemoptyiss .  Does report she take etodolac and ibuprofen for knee pain on occasion.  We discussed dangers of NSAID and NOAC . She will stop NSAIDS now.  Advised to follow up with PCP Manson Passey for knee issues.    Current Medications, Allergies, Complete Past Medical History, Past Surgical History, Family History, and Social History were reviewed in [employer] record.  ROS  The following are not active complaints unless bolded sore throat, dysphagia, dental problems, itching, sneezing,  nasal congestion or excess/ purulent  secretions, ear ache,   fever, chills, sweats, unintended wt loss, classically pleuritic or exertional cp, hemoptysis,  orthopnea pnd or leg swelling, presyncope, palpitations, abdominal pain, anorexia, nausea, vomiting, diarrhea  or change in bowel or bladder habits, change in stools or urine, dysuria,hematuria,  rash, arthralgias, visual complaints, headache, numbness, weakness or ataxia or problems with walking or coordination,  change in mood/affect or memory.              Objective:   Physical Exam  amb obese bf nad  03/25/2015      249 > 06/24/2015   258    Filed Vitals:   10/08/15 0912  BP: 114/86  Pulse: 104  Temp: 98.4 F (36.9 C)  TempSrc: Oral  Height: 5\' 3"  (1.6 m)  Weight: 258 lb (117.028 kg)  SpO2: 94%    Vital signs reviewed    HEENT: nl dentition, turbinates, and orophanx. Nl external ear canals without cough reflex   NECK :  without JVD/Nodes/TM/ nl carotid upstrokes bilaterally No stridor   LUNGS: no acc muscle use, + exp wheezing , speaks in full sentences.    CV:  RRR  no s3 or murmur or increase in P2, no edema   ABD:  soft and nontender with nl excursion in the supine position. No bruits or organomegaly, bowel sounds nl  MS:  warm without deformities, calf tenderness, cyanosis or clubbing  SKIN: warm and dry without lesions    NEURO:  alert, approp, no deficits       CXR:  06/21/15 1. No radiographic evidence of acute cardiopulmonary disease. 2. Atherosclerosis.   Glenwood Revoir NP-C  [facility]  10/08/2015     Assessment & Plan:

## 2015-10-08 NOTE — Patient Instructions (Addendum)
Stop Etodolac. Do not take ibuprofen, aleve, etc. NSAIDS .  Zpack take as directed.  Prednisone taper over next week.  Mucinex DM Twice daily  As needed  Cough/congestion  Restart Protonix and Pepcid.  May use Tramadol As needed  Severe cough , may make you sleepy.  Continue on Dulera 2 puffs Twice daily  , rinse after use.  Add zyrtec 10mg  At bedtime .  Add Nasonex 2 puffs daily  Follow up with Dr. Melvyn Novas  In 6 weeks and As needed   Please contact office for sooner follow up if symptoms do not improve or worsen or seek emergency care

## 2015-10-08 NOTE — Progress Notes (Signed)
Chart and office note reviewed in detail  > agree with a/p as outlined    

## 2015-10-10 ENCOUNTER — Ambulatory Visit: Payer: Medicaid Other | Admitting: Internal Medicine

## 2015-11-16 ENCOUNTER — Other Ambulatory Visit (HOSPITAL_COMMUNITY)
Admission: RE | Admit: 2015-11-16 | Discharge: 2015-11-16 | Disposition: A | Discharge status: Home / Self Care | Payer: Medicaid Other | Source: Ambulatory Visit | Attending: Family Medicine | Admitting: Family Medicine

## 2015-11-16 ENCOUNTER — Other Ambulatory Visit: Payer: Self-pay | Admitting: Family Medicine

## 2015-11-16 DIAGNOSIS — Z113 Encounter for screening for infections with a predominantly sexual mode of transmission: Secondary | ICD-10-CM | POA: Diagnosis not present

## 2015-11-19 ENCOUNTER — Encounter: Payer: Self-pay | Admitting: Internal Medicine

## 2015-11-19 ENCOUNTER — Ambulatory Visit (INDEPENDENT_AMBULATORY_CARE_PROVIDER_SITE_OTHER): Payer: Medicaid Other | Admitting: Internal Medicine

## 2015-11-19 VITALS — BP 126/80 | HR 89 | Ht 63.0 in | Wt 263.6 lb

## 2015-11-19 DIAGNOSIS — J45991 Cough variant asthma: Secondary | ICD-10-CM | POA: Diagnosis not present

## 2015-11-19 DIAGNOSIS — R0600 Dyspnea, unspecified: Secondary | ICD-10-CM

## 2015-11-19 DIAGNOSIS — I2699 Other pulmonary embolism without acute cor pulmonale: Secondary | ICD-10-CM

## 2015-11-19 DIAGNOSIS — G4730 Sleep apnea, unspecified: Secondary | ICD-10-CM

## 2015-11-19 DIAGNOSIS — G4733 Obstructive sleep apnea (adult) (pediatric): Secondary | ICD-10-CM | POA: Diagnosis not present

## 2015-11-19 NOTE — Assessment & Plan Note (Signed)
02/22/2015  Walked RA x 3 laps @ 185 ft each stopped due to  End of study, moderate  pace, no sob or desat  Or tachycardia - Spirometry 11/19/2015  Min Restrictive changes only   Better p rx for gerd/ no change in rx, keep working on wt

## 2015-11-19 NOTE — Assessment & Plan Note (Addendum)
epworth 11/19/2015  = 20  - split night rec 11/19/2015 and f/u sleep medicine

## 2015-11-19 NOTE — Assessment & Plan Note (Signed)
See CT 10/09/14 central clots/ no assoc effusion/infarct apparent  rec repeat 02/22/2015 > Echo 10/09/14 LVEF 65-70%, moderate LVH with severe focal basal septal hypertrophy, diastolic dysfunction, normal LV filling pressure, normal RV size and systolic function, moderate pulmonary hypertension with RVSP 59 mmHg > rec Repeat 02/28/15 wnl  Venous dopplers 10/09/14 > pos R DVT >  Repeat 03/01/15 : No evidence of right lower extremity deep or superficial acute or residual venous thrombus or incompetence. Cta repeat 03/01/2015 > Interval resolution of previously noted pulmonary emboli. - Echo 02/28/15 > nl   Main risk for recurrence is morbid obesity which has not been addressed.  Discussed in detail all the  indications, usual  risks and alternatives  relative to the benefits with patient who agrees to proceed with continuing longterm eliquis until gets wt turned around

## 2015-11-19 NOTE — Progress Notes (Signed)
Subjective:    Patient ID: Patricia Ramsey, female    DOB: [birth year]     MRN: [id number]    Brief patient profile:  71 yobf never smoker with h/o asthma seasonal problems prn saba maybe once a week  then fell and R knee injury at wt [address] then surgery September 21 2014 [facility] then severe sob  Oct 09 2014 dx dvt  R leg and   PE >  Still sob and L cp > eval in pulmonary clinic for the first time 02/22/15    History of Present Illness  02/22/2015 1st [facility] office visit/ [facility]   Chief Complaint  Patient presents with  . Pulmonary Consult    Self referral. Pt c/o SOB and left side pain since dx of PE in May 2016. She gets out of breath walking to the mailbox. She also c/o cough- non prod "but sometimes I taste blood".   says was seen multiple times for cp and sob before dx of PE on May 10 but nothing to support this in EPIC and says only 50% better sob and no better L cp which is more positional than pleuritic, hurts worse L side down.  Was very inactive for months before knee surgery and the last time she could walk to mailbox s sob was prior to the knee injury in oct 2015   Says asthma poorly controlled for months with dry day > noct cough and sense of wheezing on singulair and freq saba  rec Please see patient coordinator before you leave today  to schedule CTa chest and venous dopplers >   Repeat 03/01/15 : No evidence of right lower extremity deep or superficial acute or residual venous thrombus or incompetence. Cta repeat 03/01/2015 > Interval resolution of previously noted pulmonary emb Pantoprazole (protonix) 40 mg   Take  30-60 min before first meal of the day and Pepcid (famotidine)  20 mg one @  Bedtime   GERD diet  Late add : echo Repeat 02/28/15 wnl    03/25/2015  f/u ov/[facility] re: dyspnea/ cough PE may 2016 on eliquis  Chief Complaint  Patient presents with  . Follow-up    Dyspnea. Pt c/o of occasional cough. Pt states that her dyspnea has improved.   tolerating more activity  but still sob > slow paced walking/ mostly daytime, mostly dry coughing> not needing any form of saba at this point  rec Work on wt   06/24/2015 acute extended ov/[facility] re: cough/ wheeze / now maint on singulair Chief Complaint  Patient presents with  . Acute Visit    Pt c/o increased SOB and wheezing x 4 weeks. Pt notes clear mucus production with cough; started with yellow. Currently taking Pred taper and Zpak.   onset was in November 2016 while on gerd rx >> gradually wore so went to ER [facility] 06/04/14 > rx nebs/steroids>>  cough improved and felt better but still felt herself  wheezing   despite alb/prednisone > 06/20/14 > some better now  rec Finish prednisone  Plan A = automatic = dulera 100 Take 2 puffs first thing in am and then another 2 puffs about 12 hours later.  Plan B = Backup Only use your albuterol (proair) as a rescue medication  Plan C = crisis Only use the albuterol neb if you try the proair and it's doesn't work    10/08/2015 NP Acute OV  Pt presents for an acute office visit. Complains of chest congestion/tightness, SOB, wheezing, prod  cough with clear mucus, sinus drainage/pressure starting on 10/03/15.  Using more albuterol over last few days. Remains on Dulera and Singulair.  Ran out of PPI /Pepcid , refill ran out.  Denies any fever, nausea or vomiting, hemoptysis , orthopnea or edema.  Says she was sick in March like this, resolved with oc cold meds. No abx needed.  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  Pt with hx of PE in 2016  On Eliquis  No chest pain, or hemoptyiss .  Does report she take etodolac and ibuprofen for knee pain on occasion.  We discussed dangers of NSAID and NOAC . She will stop NSAIDS now.  Advised to follow up with PCP Manson Passey for knee issues.  rec Stop Etodolac. Do not take ibuprofen, aleve, etc. NSAIDS .  Zpack take as directed.  Prednisone taper over next week.  Mucinex DM Twice daily  As needed  Cough/congestion  Restart  Protonix and Pepcid.  May use Tramadol As needed  Severe cough , may make you sleepy.  Continue on Dulera 2 puffs Twice daily  , rinse after use.  Add zyrtec 10mg  At bedtime .  Add Nasonex 2 puffs daily       11/19/2015  f/u ov/[facility]ert re:  Obesity/ cough variant asthma/ pe/dvt  Chief Complaint  Patient presents with  . Follow-up    Breathing has improved some, but not back to normal baseline. She is coughing less. She states wakes up with HA every day and is conderned b/c daughter tells her she stops breathing while asleep.   doe = MMRC3 = can't walk 100 yards even at a slow pace at a flat grade s stopping due to sob  - can't do HT s stopping / lean on cart/also limited by back pain  Only using saba if over does it, not noct   Sleeps poorly wakes up choking/ am ha better as day goes on / already dx as osa on cpap but stopped around 2008-9                         Objective:   Physical Exam  amb obese bf nad  03/25/2015      249 > 06/24/2015   258 > 11/19/2015   264      Vital signs reviewed    HEENT: nl dentition, turbinates, and orophanx. Nl external ear canals without cough reflex - Modified Mallampati Score = 3    NECK :  without JVD/Nodes/TM/ nl carotid upstrokes bilaterally No stridor   LUNGS: no acc muscle use, + exp wheezing , speaks in full sentences.    CV:  RRR  no s3 or murmur or increase in P2, no edema   ABD:  soft and nontender with nl excursion in the supine position. No bruits or organomegaly, bowel sounds nl  MS:  warm without deformities, calf tenderness, cyanosis or clubbing  SKIN: warm and dry without lesions    NEURO:  alert, approp, no deficits             Assessment & Plan:

## 2015-11-19 NOTE — Assessment & Plan Note (Signed)
Body mass index is 46.71 still trending up  No results found for: TSH   Contributing to gerd tendency/ doe/reviewed the need and the process to achieve and maintain neg calorie balance > defer f/u primary care including intermittently monitoring thyroid status

## 2015-11-19 NOTE — Patient Instructions (Addendum)
Please see patient coordinator before you leave today  to schedule split night study   No change in pulmonary recommendations - we will set you up to see a sleep doctor who can also refill your pulmonary medications after we get back your sleep study

## 2015-11-19 NOTE — Assessment & Plan Note (Addendum)
06/24/2015   try dulera 100 2bid - Spirometry 11/19/2015  No obst  - The proper method of use, as well as anticipated side effects, of a metered-dose inhaler are discussed and demonstrated to the patient. Improved effectiveness after extensive coaching during this visit to a level of approximately 75 % from a baseline of 50 %     All goals of chronic asthma control met despite poor hfa including optimal function and elimination of symptoms with minimal need for rescue therapy.  Contingencies discussed in full including contacting this office immediately if not controlling the symptoms using the rule of two's.     I had an extended discussion with the patient reviewing all relevant studies completed to date and  lasting 15 to 20 minutes of a 25 minute visit    Each maintenance medication was reviewed in detail including most importantly the difference between maintenance and prns and under what circumstances the prns are to be triggered using an action plan format that is not reflected in the computer generated alphabetically organized AVS.    Please see instructions for details which were reviewed in writing and the patient given a copy highlighting the part that I personally wrote and discussed at today's ov.

## 2015-11-20 LAB — URINE CYTOLOGY ANCILLARY ONLY
Bacterial vaginitis: NEGATIVE
CHLAMYDIA, DNA PROBE: NEGATIVE
Candida vaginitis: NEGATIVE
NEISSERIA GONORRHEA: NEGATIVE
TRICH (WINDOWPATH): NEGATIVE

## 2015-11-22 ENCOUNTER — Ambulatory Visit (HOSPITAL_BASED_OUTPATIENT_CLINIC_OR_DEPARTMENT_OTHER): Payer: Medicaid Other | Attending: Internal Medicine | Admitting: Internal Medicine

## 2015-11-22 VITALS — Ht 63.0 in | Wt 265.0 lb

## 2015-11-22 DIAGNOSIS — Z6841 Body Mass Index (BMI) 40.0 and over, adult: Secondary | ICD-10-CM | POA: Diagnosis not present

## 2015-11-22 DIAGNOSIS — G4730 Sleep apnea, unspecified: Secondary | ICD-10-CM | POA: Diagnosis present

## 2015-11-22 DIAGNOSIS — G4733 Obstructive sleep apnea (adult) (pediatric): Secondary | ICD-10-CM | POA: Diagnosis not present

## 2015-11-22 DIAGNOSIS — R0683 Snoring: Secondary | ICD-10-CM | POA: Diagnosis not present

## 2015-11-22 DIAGNOSIS — E669 Obesity, unspecified: Secondary | ICD-10-CM | POA: Diagnosis not present

## 2015-11-22 DIAGNOSIS — R5383 Other fatigue: Secondary | ICD-10-CM | POA: Insufficient documentation

## 2015-11-30 DIAGNOSIS — G4730 Sleep apnea, unspecified: Secondary | ICD-10-CM | POA: Diagnosis not present

## 2015-11-30 DIAGNOSIS — G4733 Obstructive sleep apnea (adult) (pediatric): Secondary | ICD-10-CM | POA: Diagnosis not present

## 2015-11-30 NOTE — Procedures (Signed)
Patient Name: Patricia Ramsey, Patricia Ramsey Date: 11/22/2015 Gender: Female D.O.B: 12-15-1965 Age (years): 44 Referring Provider: Tanda Rockers Height (inches): 5 Interpreting Physician: Baird Lyons MD, ABSM Weight (lbs): 263 RPSGT: Baxter Flattery BMI: 109 MRN: [id number] Neck Size: 17.00 CLINICAL INFORMATION Sleep Study Type: Split Night CPAP Indication for sleep study: Fatigue, Hypertension, Obesity, OSA, Snoring, Witnessed Apneas Epworth Sleepiness Score: 23  SLEEP STUDY TECHNIQUE As per the AASM Manual for the Scoring of Sleep and Associated Events v2.3 (April 2016) with a hypopnea requiring 4% desaturations. The channels recorded and monitored were frontal, central and occipital EEG, electrooculogram (EOG), submentalis EMG (chin), nasal and oral airflow, thoracic and abdominal wall motion, anterior tibialis EMG, snore microphone, electrocardiogram, and pulse oximetry. Continuous positive airway pressure (CPAP) was initiated when the patient met split night criteria and was titrated according to treat sleep-disordered breathing.  MEDICATIONS Medications taken by the patient : charted for review Medications administered by patient during sleep study : No sleep medicine administered.  RESPIRATORY PARAMETERS Diagnostic Total AHI (/hr): 42.6 RDI (/hr): 42.6 OA Index (/hr): 3.4 CA Index (/hr): 0.0 REM AHI (/hr): 78.6 NREM AHI (/hr): 39.4 Supine AHI (/hr): 42.6 Non-supine AHI (/hr): N/A Min O2 Sat (%): 52.00 Mean O2 (%): 88.96 Time below 88% (min): 46.8   Titration Optimal Pressure (cm): 14 AHI at Optimal Pressure (/hr): 0.0 Min O2 at Optimal Pressure (%): 87.0 Supine % at Optimal (%): 0 Sleep % at Optimal (%): 100    SLEEP ARCHITECTURE The recording time for the entire night was 418.4 minutes. During a baseline period of 205.7 minutes, the patient slept for 176.0 minutes in REM and nonREM, yielding a sleep efficiency of 85.6%. Sleep onset after lights out was 10.1 minutes with a REM  latency of 116.0 minutes. The patient spent 11.65% of the night in stage N1 sleep, 80.11% in stage N2 sleep, 0.00% in stage N3 and 8.24% in REM. During the titration period of 209.4 minutes, the patient slept for 209.3 minutes in REM and nonREM, yielding a sleep efficiency of 100.0%. Sleep onset after CPAP initiation was 0.1 minutes with a REM latency of 13.0 minutes. The patient spent 3.58% of the night in stage N1 sleep, 47.44% in stage N2 sleep, 0.00% in stage N3 and 48.98% in REM.  CARDIAC DATA The 2 lead EKG demonstrated sinus rhythm. The mean heart rate was 88.21 beats per minute. Other EKG findings include: None.  LEG MOVEMENT DATA The total Periodic Limb Movements of Sleep (PLMS) were 0. The PLMS index was 0.00 .  IMPRESSIONS - Severe obstructive sleep apnea occurred during the diagnostic portion of the study (AHI = 42.6/hour). An optimal PAP pressure was selected for this patient ( 14 cm of water) - No significant central sleep apnea occurred during the diagnostic portion of the study (CAI = 0.0/hour). - Severe oxygen desaturation was noted during the diagnostic portion of the study (Min O2 = 52.00%). - Loud snoring was audible during the diagnostic portion of the study. - No cardiac abnormalities were noted during this study. - Clinically significant periodic limb movements did not occur during sleep.  DIAGNOSIS - Obstructive Sleep Apnea (327.23 [G47.33 ICD-10])  RECOMMENDATIONS - Trial of CPAP therapy on 14 cm H2O with a Medium size Resmed Full Face Mask AirFit F20 mask and heated humidification. - Avoid alcohol, sedatives and other CNS depressants that may worsen sleep apnea and disrupt normal sleep architecture. - Sleep hygiene should be reviewed to assess factors that may improve sleep quality. - Weight  management and regular exercise should be initiated or continued.  [name] Diplomate, American Board of Sleep Medicine  ELECTRONICALLY SIGNED ON:  11/30/2015, 10:33  AM [place] PH: [phone]   FX: [phone] [place]

## 2015-12-02 ENCOUNTER — Other Ambulatory Visit: Payer: Self-pay | Admitting: Internal Medicine

## 2015-12-02 DIAGNOSIS — G4730 Sleep apnea, unspecified: Secondary | ICD-10-CM

## 2015-12-02 NOTE — Progress Notes (Signed)
Quick Note:  Spoke with pt and notified of results per Dr. Wert. Pt verbalized understanding and denied any questions.  ______ 

## 2016-01-08 ENCOUNTER — Encounter (HOSPITAL_BASED_OUTPATIENT_CLINIC_OR_DEPARTMENT_OTHER): Payer: Medicaid Other

## 2016-01-14 ENCOUNTER — Other Ambulatory Visit: Payer: Self-pay | Admitting: Gastroenterology

## 2016-02-06 ENCOUNTER — Institutional Professional Consult (permissible substitution): Payer: Medicaid Other | Admitting: Pulmonary Disease

## 2016-04-07 ENCOUNTER — Ambulatory Visit (INDEPENDENT_AMBULATORY_CARE_PROVIDER_SITE_OTHER): Payer: Medicaid Other | Admitting: Pulmonary Disease

## 2016-04-07 ENCOUNTER — Encounter: Payer: Self-pay | Admitting: Pulmonary Disease

## 2016-04-07 ENCOUNTER — Ambulatory Visit: Payer: Medicaid Other

## 2016-04-07 VITALS — BP 112/84 | HR 75 | Ht 63.0 in | Wt 259.8 lb

## 2016-04-07 DIAGNOSIS — J4530 Mild persistent asthma, uncomplicated: Secondary | ICD-10-CM | POA: Diagnosis not present

## 2016-04-07 DIAGNOSIS — Z9989 Dependence on other enabling machines and devices: Secondary | ICD-10-CM

## 2016-04-07 DIAGNOSIS — G4733 Obstructive sleep apnea (adult) (pediatric): Secondary | ICD-10-CM | POA: Diagnosis not present

## 2016-04-07 DIAGNOSIS — Z6841 Body Mass Index (BMI) 40.0 and over, adult: Secondary | ICD-10-CM

## 2016-04-07 DIAGNOSIS — Z23 Encounter for immunization: Secondary | ICD-10-CM

## 2016-04-07 NOTE — Progress Notes (Signed)
   Subjective:    Patient ID: Patricia Ramsey, female    DOB: 1966/05/16, 50 y.o.   MRN: [id number]  HPI    Review of Systems  Constitutional: Negative for fever and unexpected weight change.  HENT: Negative for congestion, dental problem, ear pain, nosebleeds, postnasal drip, rhinorrhea, sinus pressure, sneezing, sore throat and trouble swallowing.   Eyes: Negative for redness and itching.  Respiratory: Positive for wheezing. Negative for cough, chest tightness and shortness of breath.   Cardiovascular: Positive for leg swelling. Negative for palpitations.  Gastrointestinal: Negative for nausea and vomiting.  Genitourinary: Negative for dysuria.  Musculoskeletal: Negative for joint swelling.  Skin: Negative for rash.  Neurological: Positive for headaches.  Hematological: Does not bruise/bleed easily.  Psychiatric/Behavioral: Positive for dysphoric mood. The patient is nervous/anxious.        Objective:   Physical Exam        Assessment & Plan:

## 2016-04-07 NOTE — Patient Instructions (Signed)
Will arrange for CPAP mask refitting  Follow up in 3 months

## 2016-04-07 NOTE — Progress Notes (Signed)
Current Outpatient Prescriptions on File Prior to Visit  Medication Sig  . albuterol (PROVENTIL) (2.5 MG/3ML) 0.083% nebulizer solution Take 3 mLs (2.5 mg total) by nebulization every 6 (six) hours as needed for wheezing or shortness of breath.  Marland Kitchen apixaban (ELIQUIS) 5 MG TABS tablet Take 1 tablet (5 mg total) by mouth 2 (two) times daily.  . famotidine (PEPCID) 20 MG tablet One at bedtime  . gabapentin (NEURONTIN) 300 MG capsule Take 1 capsule (300 mg total) by mouth 3 (three) times daily.  . hydrochlorothiazide (HYDRODIURIL) 25 MG tablet Take 25 mg by mouth daily.  . mometasone (NASONEX) 50 MCG/ACT nasal spray Place 2 sprays into the nose daily.  . mometasone-formoterol (DULERA) 100-5 MCG/ACT AERO Take 2 puffs first thing in am and then another 2 puffs about 12 hours later.  . pantoprazole (PROTONIX) 40 MG tablet Take 1 tablet (40 mg total) by mouth daily. Take 30-60 min before first meal of the day  . sodium chloride (OCEAN) 0.65 % SOLN nasal spray Place 1 spray into both nostrils as needed for congestion.  . montelukast (SINGULAIR) 10 MG tablet Take 1 tablet (10 mg total) by mouth at bedtime. Reported on 10/08/2015 (Patient not taking: Reported on 04/07/2016)   No current facility-administered medications on file prior to visit.     Review of Systems  Constitutional: Negative for fever and unexpected weight change.  HENT: Negative for congestion, dental problem, ear pain, nosebleeds, postnasal drip, rhinorrhea, sinus pressure, sneezing, sore throat and trouble swallowing.   Eyes: Negative for redness and itching.  Respiratory: Positive for wheezing. Negative for cough, chest tightness and shortness of breath.   Cardiovascular: Positive for leg swelling. Negative for palpitations.  Gastrointestinal: Negative for nausea and vomiting.  Genitourinary: Negative for dysuria.  Musculoskeletal: Negative for joint swelling.  Skin: Negative for rash.  Neurological: Positive for headaches.   Hematological: Does not bruise/bleed easily.  Psychiatric/Behavioral: Positive for dysphoric mood. The patient is nervous/anxious.     Chief Complaint  Patient presents with  . SLEEP CONSULT    Referred by MW. Sleep study 10/2015. CPAP setting 14.  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  Vital Signs BP 112/84 (BP Location: Right Arm, Cuff Size: Large)   Pulse 75   Ht 5\' 3"  (1.6 m)   Wt 259 lb 12.8 oz (117.8 kg)   SpO2 96%   BMI 46.02 kg/m   History of Present Illness Patricia Ramsey is a 50 y.o. female with obstructive sleep apnea.  She had sleep study in 2005.  She was found have sleep apnea.  She was on CPAP, but stopped using it.    She had a right knee injury after falling in April 2016. She was found to have Rt leg DVT and then PE.  She was having trouble with dyspnea.  She saw Dr. Melvyn Novas.  She also has hx of allergies and asthma.    She had sleep study in June 2017.  This showed severe OSA.  She was started  on CPAP at 14 cm H2O.  She felt this helped initially.  More recently she has started snoring again.  She has nasal mask, and this is starting to irritate her face.    Prior to getting CPAP again, she was snoring and would stop breathing.  She would be tired all the time.  Her Epworth score is 24 out of 24.    She gets trouble with her asthma during the Fall and Spring.  Her symptoms are controlled currently with dulera, and singulair.  She has not had allergy testing before.  She is not aware of food/medication  allergies.    Physical Exam  General - No distress ENT - No sinus tenderness, no oral exudate, no LAN, MP 3, enlarged tongue Cardiac - s1s2 regular, no murmur Chest - No wheeze/rales/dullness Back - No focal tenderness Abd - Soft, non-tender Ext - No edema Neuro - Normal strength Skin - No rashes Psych - normal mood, and behavior   Assessment/Plan  Obstructive sleep apnea. - We discussed how sleep apnea can affect various health problems, including risks for hypertension, cardiovascular disease, and diabetes.  We also discussed how sleep disruption can increase risks for accidents, such as while driving.  Weight loss as a means of improving sleep apnea was also reviewed.  Additional treatment options discussed were CPAP therapy, oral appliance, and surgical intervention. - continue CPAP 14 cm H2O for now - will get copy of her download and determine if she needs to have her settings adjusted - will arrange for CPAP mask refit  Allergic asthma. - stable on current regimen of dulera, singulair, and prn albuterol - flu shot today  Obesity. - discussed importance of weight loss  Hx of DVT and PE in April 2016 after right knee injury (provoked event). - she is to f/u with PCP for management of eliquis and determine how long she should remain on therapy   Patient Instructions  Will arrange for CPAP mask refitting  Follow up in 3 months   Chesley Mires, MD [facility] Pager:  [phone] 04/07/2016, 9:41 AM

## 2016-04-28 ENCOUNTER — Other Ambulatory Visit: Payer: Self-pay | Admitting: Adult Health

## 2016-04-28 DIAGNOSIS — R0600 Dyspnea, unspecified: Secondary | ICD-10-CM

## 2016-05-05 ENCOUNTER — Encounter (INDEPENDENT_AMBULATORY_CARE_PROVIDER_SITE_OTHER): Payer: Self-pay | Admitting: Orthopaedic Surgery

## 2016-05-05 ENCOUNTER — Ambulatory Visit (INDEPENDENT_AMBULATORY_CARE_PROVIDER_SITE_OTHER): Payer: Medicaid Other

## 2016-05-05 ENCOUNTER — Ambulatory Visit (INDEPENDENT_AMBULATORY_CARE_PROVIDER_SITE_OTHER): Payer: Medicaid Other | Admitting: Orthopaedic Surgery

## 2016-05-05 ENCOUNTER — Telehealth (INDEPENDENT_AMBULATORY_CARE_PROVIDER_SITE_OTHER): Payer: Self-pay | Admitting: Orthopaedic Surgery

## 2016-05-05 VITALS — BP 121/82 | HR 68 | Ht 63.0 in | Wt 252.0 lb

## 2016-05-05 DIAGNOSIS — M545 Low back pain, unspecified: Secondary | ICD-10-CM

## 2016-05-05 DIAGNOSIS — G8929 Other chronic pain: Secondary | ICD-10-CM

## 2016-05-05 DIAGNOSIS — M25561 Pain in right knee: Secondary | ICD-10-CM | POA: Diagnosis not present

## 2016-05-05 DIAGNOSIS — M25572 Pain in left ankle and joints of left foot: Secondary | ICD-10-CM

## 2016-05-05 MED ORDER — METHYLPREDNISOLONE 2 MG PO TABS: ORAL_TABLET | ORAL | 0 refills | Status: DC

## 2016-05-05 NOTE — Telephone Encounter (Signed)
Pt needs 2 wk return with Dr. Lorin Mercy but cannot come on the 19th when he has an appt. Please advise. [phone]

## 2016-05-05 NOTE — Telephone Encounter (Signed)
The only thing that we can offer her is what he has available. If you could just call her and let her know what he has. Going to the [place] office on a Thursday is also an option. Thanks.

## 2016-05-05 NOTE — Progress Notes (Signed)
Office Visit Note   Patient: Patricia Ramsey           Date of Birth: Jul 11, 1965           MRN: [id number] Visit Date: 05/05/2016              Requested by: Lucianne Lei, MD [address] [place], [place] [address] PCP: Elyn Peers, MD   Assessment & Plan: Visit Diagnoses:  1. Chronic pain of right knee   2. Chronic left-sided low back pain without sciatica   3. Pain in left ankle and joints of left foot     Plan: This was given a Medrol Dosepak 6 day taper was taken as directed see if this helps all areas addressed today. Follow-up the office in 2 weeks for recheck. We'll consider further imaging studies depending on what is bothering her the most. All questions answered.   Follow-Up Instructions: Return in about 2 weeks (around 05/19/2016).   Orders:  Orders Placed This Encounter  Procedures  . XR Knee 1-2 Views Right  . XR Lumbar Spine 2-3 Views   Meds ordered this encounter  Medications  . methylPREDNISolone (MEDROL) 2 MG tablet    Sig: 6 day taper take as directed    Dispense:  21 tablet    Refill:  0      Procedures: No procedures performed   Clinical Data: No additional findings.   Subjective: Chief Complaint  Patient presents with  . Right Knee - Pain    Patient presents with right knee pain. She states that this pain is ongoing and that she has not had any new injury. She is status post Right Knee Arthroscopy on 09/21/2014. She states that the pain is so bad she can hardly get out of the bed at times. She ambulates with a cane. She does not take anything for the pain. She states that she has left low back pain. She denies it radiating down left leg. She states that the right leg always hurts.  continues to have ongoing right knee pain and popping. Not any better since last office visit. Back pain as been off and on for about 10 years. Patient states that she has a history of scoliosis and has discussed this previously with Dr. Marily Memos. Has  has some pain into the right leg. Ambulates with a cane.  Review of Systems  Constitutional: Positive for activity change.  HENT: Negative.   Respiratory: Negative.   Musculoskeletal: Positive for back pain, gait problem and joint swelling.  Psychiatric/Behavioral: Negative.      Objective: Vital Signs: BP 121/82   Pulse 68   Ht 5\' 3"  (1.6 m)   Wt 252 lb (114.3 kg)   BMI 44.64 kg/m   Physical Exam  Ortho Exam Gait is antalgic with a cane. Right knee range of motion about 0-95. Positive patellofemoral crepitus and grind. Joint line tender. Knee swelling. Negative logroll bilateral hips. Negative straight leg raise. neurovascularly intact. Skin warm and dry. Specialty Comments:  No specialty comments available.  Imaging: Xr Knee 1-2 Views Right  Result Date: 05/05/2016 X-ray right knee AP lateral views show truck orbital degenerative changes. Moderate patellofemoral changes. No signs of fracture or bony lesions. Impression right knee DJD. No acute findings.  Xr Lumbar Spine 2-3 Views  Result Date: 05/05/2016 X-rays lumbar spine show multilevel lumbar spondylosis. Scoliotic deformity. Impression multilevel lumbar spondylosis. Scoliosis.    PMFS History: Patient Active Problem List   Diagnosis Date  Noted  . Cough variant asthma vs UACS 06/24/2015  . Severe obesity (BMI >= 40) ([place]) 02/23/2015  . Dyspnea 02/22/2015  . Acute pulmonary embolism ([place]) 10/09/2014  . HTN (hypertension) 10/09/2014  . Sleep apnea in adult 10/09/2014  . Chest pain   . 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 Asthma   . Diabetes ([place])    boarderline, pt stopped Glucophage  . Hypertension    no meds  . Pulmonary embolism ([place])   . Sleep apnea     No family history on file.  Past Surgical History:  Procedure Laterality Date  . ANKLE SURGERY    . CHONDROPLASTY Right 09/21/2014   Procedure: CHONDROPLASTY right knee;  Surgeon: [name], MD;   Location: [facility];  Service: Orthopedics;  Laterality: Right;  . KNEE ARTHROSCOPY Right 09/21/2014   Procedure:  right knee arthroscopy;  Surgeon: [name], MD;  Location: [facility];  Service: Orthopedics;  Laterality: Right;  . LITHOTRIPSY    . oophrectomy    . TUBAL LIGATION     Social History   Occupational History  . unemployed    Social History Main Topics  . Smoking status: Never Smoker  . Smokeless tobacco: Never Used  . Alcohol use No  . Drug use: No  . Sexual activity: Not on file

## 2016-06-19 ENCOUNTER — Ambulatory Visit (INDEPENDENT_AMBULATORY_CARE_PROVIDER_SITE_OTHER): Payer: Medicaid Other | Admitting: Orthopaedic Surgery

## 2016-07-03 ENCOUNTER — Ambulatory Visit (INDEPENDENT_AMBULATORY_CARE_PROVIDER_SITE_OTHER): Payer: Medicaid Other | Admitting: Orthopaedic Surgery

## 2016-07-03 ENCOUNTER — Encounter (INDEPENDENT_AMBULATORY_CARE_PROVIDER_SITE_OTHER): Payer: Self-pay | Admitting: Orthopaedic Surgery

## 2016-07-03 ENCOUNTER — Ambulatory Visit (INDEPENDENT_AMBULATORY_CARE_PROVIDER_SITE_OTHER): Payer: Medicaid Other

## 2016-07-03 ENCOUNTER — Encounter (INDEPENDENT_AMBULATORY_CARE_PROVIDER_SITE_OTHER): Payer: Self-pay

## 2016-07-03 VITALS — BP 128/89 | HR 78 | Ht 63.0 in | Wt 252.0 lb

## 2016-07-03 DIAGNOSIS — M25572 Pain in left ankle and joints of left foot: Secondary | ICD-10-CM

## 2016-07-03 DIAGNOSIS — M5442 Lumbago with sciatica, left side: Secondary | ICD-10-CM

## 2016-07-03 DIAGNOSIS — G8929 Other chronic pain: Secondary | ICD-10-CM | POA: Diagnosis not present

## 2016-07-03 NOTE — Addendum Note (Signed)
Addended by: Meyer Cory on: 07/03/2016 03:13 PM   Modules accepted: Orders

## 2016-07-03 NOTE — Progress Notes (Signed)
Office Visit Note   Patient: Patricia Ramsey           Date of Birth: [birth year]           MRN: [id number] Visit Date: 07/03/2016              Requested by: Lucianne Lei, MD [address] [place], [place] [address] PCP: Elyn Peers, MD   Assessment & Plan: Visit Diagnoses:  1. Pain in left ankle and joints of left foot   2.      Low back pain chronic in nature with the left leg pain. 3.       Lumbar scoliosis  Plan: Due to patient's chronic back pain with persistent symptoms in her leg will proceed with the MRI scan lumbar spine. She responded to a prednisone pack with some temporary improvement. She ambulates with a cane . Office follow-up after MRI scan. Patient put on anti-inflammatories but on exercise program. She has obesity and is physically deconditioned. Office follow-up after MRI for review.  Follow-Up Instructions: No Follow-up on file.   Orders:  Orders Placed This Encounter  Procedures  . XR Ankle Complete Left   No orders of the defined types were placed in this encounter.     Procedures: No procedures performed   Clinical Data: No additional findings.   Subjective: Chief Complaint  Patient presents with  . Right Knee - Follow-up  . Left Foot - Follow-up  . Follow-up    Patient is here for her 2 week ROV for her RT knee, LT leg/foot and her back.  States that RT knee is not any better, having some swelling at times.  Back is about the same, pain is radiating into LT leg, no numbness/tingling.  Amubulates with a cane.  Currently not taking any pain medicine.    Review of Systems  Constitutional: Negative for chills and diaphoresis.  HENT: Negative for ear discharge, ear pain and nosebleeds.   Eyes: Negative for discharge and visual disturbance.  Respiratory: Negative for cough, choking and shortness of breath.   Cardiovascular: Negative for chest pain and palpitations.  Gastrointestinal: Negative for abdominal distention and abdominal pain.   Endocrine: Negative for cold intolerance and heat intolerance.  Genitourinary: Negative for flank pain and hematuria.  Musculoskeletal:       Patient is not not worked since 2008. She takes care of her grandchildren. 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 Chronic low back pain with left leg pain. She ambulates with a cane when she goes out in the community. Previous right knee arthroscopy. She is having continued problems with her left knee without locking or catching.  Skin: Negative for rash and wound.  Neurological: Negative for seizures and speech difficulty.  Hematological: Negative for adenopathy. Does not bruise/bleed easily.  Psychiatric/Behavioral: Negative for agitation and suicidal ideas.     Objective: Vital Signs: BP 128/89   Pulse 78   Ht 5\' 3"  (1.6 m)   Wt 252 lb (114.3 kg)   BMI 44.64 kg/m   Physical Exam  Constitutional: She is oriented to person, place, and time. She appears well-developed.  HENT:  Head: Normocephalic.  Right Ear: External ear normal.  Left Ear: External ear normal.  Eyes: Pupils are equal, round, and reactive to light.  Neck: No tracheal deviation present. No thyromegaly present.  Cardiovascular: Normal rate.   Pulmonary/Chest: Effort normal.  Abdominal: Soft.  Musculoskeletal:  Healed scar left  anterior surface of the tibia. This appears to been an anterolateral approach. Good ankle range of motion anterior tib EHL gastrocsoleus is strong. She has some discomfort prompted compression sciatic notch palpation. Some leg pain with straight leg raising at 90. Ankle jerk are intact. Good quad strength. Pelvis is level lumbar scoliosis with the compensatory thoracic curve.  Neurological: She is alert and oriented to person, place, and time.  Skin: Skin is warm and dry.  Psychiatric: She has a normal mood and affect. Her behavior is normal.    Ortho Exam  Specialty Comments:  No specialty comments  available.  Imaging: Xr Ankle Complete Left  Result Date: 07/03/2016 Previous x-rays left ankle obtained. 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 She has some changes on the medial aspect of the fibular shaft as well. 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 There is no arthritic changes in the ankle joint no bone destruction no periosteal reaction. Assessment: Postsurgical changes left ankle as described above    PMFS History: Patient Active Problem List   Diagnosis Date Noted  . Cough variant asthma vs UACS 06/24/2015  . Severe obesity (BMI >= 40) ([place]) 02/23/2015  . Dyspnea 02/22/2015  . Acute pulmonary embolism ([place]) 10/09/2014  . HTN (hypertension) 10/09/2014  . Sleep apnea in adult 10/09/2014  . Chest pain   . 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 Asthma   . Diabetes ([place])    boarderline, pt stopped Glucophage  . Hypertension    no meds  . Pulmonary embolism ([place])   . Sleep apnea     No family history on file.  Past Surgical History:  Procedure Laterality Date  . ANKLE SURGERY    . CHONDROPLASTY Right 09/21/2014   Procedure: CHONDROPLASTY right knee;  Surgeon: [name], MD;  Location: [facility];  Service: Orthopedics;  Laterality: Right;  . KNEE ARTHROSCOPY Right 09/21/2014   Procedure:  right knee arthroscopy;  Surgeon: [name], MD;  Location: [facility];  Service: Orthopedics;  Laterality: Right;  . LITHOTRIPSY    . oophrectomy    . TUBAL LIGATION     Social History   Occupational History  . unemployed    Social History Main Topics  . Smoking status: Never Smoker  . Smokeless tobacco: Never Used  . Alcohol use No  . Drug use: No  . Sexual activity: Not on file

## 2016-07-06 ENCOUNTER — Telehealth (INDEPENDENT_AMBULATORY_CARE_PROVIDER_SITE_OTHER): Payer: Self-pay | Admitting: *Deleted

## 2016-07-06 NOTE — Telephone Encounter (Signed)
Pt has MRI appt on 07/11/16 at [facility] imaging, per Lorin Mercy needs a follow up MRI review, there are no openings until March 20th, not sure if he wants her to wait that long. Please advise

## 2016-07-06 NOTE — Telephone Encounter (Signed)
Can you please put patient on for 2/27 for MRI review?  Thanks.

## 2016-07-09 ENCOUNTER — Ambulatory Visit: Payer: Medicaid Other | Admitting: Pulmonary Disease

## 2016-07-11 ENCOUNTER — Ambulatory Visit
Admission: RE | Admit: 2016-07-11 | Discharge: 2016-07-11 | Disposition: A | Discharge status: Home / Self Care | Payer: Medicaid Other | Source: Ambulatory Visit | Attending: Orthopaedic Surgery | Admitting: Orthopaedic Surgery

## 2016-07-11 DIAGNOSIS — M5442 Lumbago with sciatica, left side: Principal | ICD-10-CM

## 2016-07-11 DIAGNOSIS — G8929 Other chronic pain: Secondary | ICD-10-CM

## 2016-07-28 ENCOUNTER — Encounter (INDEPENDENT_AMBULATORY_CARE_PROVIDER_SITE_OTHER): Payer: Self-pay | Admitting: Orthopaedic Surgery

## 2016-07-28 ENCOUNTER — Telehealth (INDEPENDENT_AMBULATORY_CARE_PROVIDER_SITE_OTHER): Payer: Self-pay | Admitting: Orthopaedic Surgery

## 2016-07-28 ENCOUNTER — Ambulatory Visit (INDEPENDENT_AMBULATORY_CARE_PROVIDER_SITE_OTHER): Payer: Medicaid Other | Admitting: Orthopaedic Surgery

## 2016-07-28 VITALS — BP 115/80 | HR 72 | Ht 63.0 in | Wt 252.0 lb

## 2016-07-28 DIAGNOSIS — G8929 Other chronic pain: Secondary | ICD-10-CM | POA: Insufficient documentation

## 2016-07-28 DIAGNOSIS — M545 Low back pain, unspecified: Secondary | ICD-10-CM | POA: Insufficient documentation

## 2016-07-28 DIAGNOSIS — M94261 Chondromalacia, right knee: Secondary | ICD-10-CM | POA: Diagnosis not present

## 2016-07-28 MED ORDER — LIDOCAINE HCL 1 % IJ SOLN
1.0000 mL | INTRAMUSCULAR | Status: AC | PRN
  Administered 2016-07-28: 1 mL

## 2016-07-28 MED ORDER — METHYLPREDNISOLONE ACETATE 40 MG/ML IJ SUSP
40.0000 mg | INTRAMUSCULAR | Status: AC | PRN
  Administered 2016-07-28: 40 mg via INTRA_ARTICULAR

## 2016-07-28 MED ORDER — BUPIVACAINE HCL 0.25 % IJ SOLN
0.6600 mL | INTRAMUSCULAR | Status: AC | PRN
  Administered 2016-07-28: .66 mL via INTRA_ARTICULAR

## 2016-07-28 NOTE — Progress Notes (Signed)
Office Visit Note   Patient: Patricia Ramsey           Date of Birth: 1965/06/05           MRN: [id number] Visit Date: 07/28/2016              Requested by: Lucianne Lei, MD [address] [place], [place] [address] PCP: Elyn Peers, MD   Assessment & Plan: Visit Diagnoses:  1. Chondromalacia of right knee   2. Chronic low back pain without sciatica, unspecified back pain laterality     Plan: Right knee injection performed when discussed weight loss to help with both her back symptoms as well as knee osteoarthritis. She tolerated the injection well. I'll recheck her again in 2 months.  Follow-Up Instructions: Return in about 3 months (around 10/25/2016).   Orders:  Orders Placed This Encounter  Procedures  . Large Joint Injection/Arthrocentesis   No orders of the defined types were placed in this encounter.     Procedures: Large Joint Inj Date/Time: 07/28/2016 10:29 AM Performed by: [name] Authorized by: [name]   Consent Given by:  Patient Site marked: the procedure site was marked   Indications:  Pain and joint swelling Location:  Knee Site:  R knee Needle Size:  22 G Needle Length:  1.5 inches Approach:  Anterolateral Ultrasound Guidance: No   Fluoroscopic Guidance: No   Arthrogram: No   Medications:  1 mL lidocaine 1 %; 40 mg methylPREDNISolone acetate 40 MG/ML; 0.66 mL bupivacaine 0.25 % Aspiration Attempted: No   Patient tolerance:  Patient tolerated the procedure well with no immediate complications     Clinical Data: No additional findings.   Subjective: Chief Complaint  Patient presents with  . Lower Back - Pain    Patient returns to review MRI Lumbar Spine. Patient still having back pain and right knee pain from the prior right knee surgery.   Patient still on the blood thinner for history of DVT with PE. Right knee giving her increased pain on the medial side with catching sometimes feels like she might fall.Patient  remains Eliquis.  Review of Systems  Constitutional: Negative for chills and diaphoresis.  HENT: Negative for ear discharge, ear pain and nosebleeds.   Eyes: Negative for discharge and visual disturbance.  Respiratory: Negative for cough, choking and shortness of breath.        History of PE still on anticoagulation.  Cardiovascular: Negative for chest pain and palpitations.  Gastrointestinal: Negative for abdominal distention and abdominal pain.  Endocrine: Negative for cold intolerance and heat intolerance.  Genitourinary: Negative for flank pain and hematuria.  Musculoskeletal:       Previous right knee arthroscopy with the grade 3 patellofemoral changes and grade 4 changes medial compartment. Lateral compartment was spared. She has some opposite left knee pain. Some chronic back pain. Right knee bothers her more than her back or her opposite left knee.  Skin: Negative for rash and wound.  Neurological: Negative for seizures and speech difficulty.  Hematological: Negative for adenopathy. Does not bruise/bleed easily.  Psychiatric/Behavioral: Negative for agitation and suicidal ideas.     Objective: Vital Signs: BP 115/80   Pulse 72   Ht 5\' 3"  (1.6 m)   Wt 252 lb (114.3 kg)   BMI 44.64 kg/m   Physical Exam  Constitutional: She is oriented to person, place, and time. She appears well-developed.  HENT:  Head: Normocephalic.  Right Ear: External ear normal.  Left Ear: External ear normal.  Eyes: Pupils are equal, round, and reactive to light.  Neck: No tracheal deviation present. No thyromegaly present.  Cardiovascular: Normal rate.   Pulmonary/Chest: Effort normal.  Abdominal: Soft.  Musculoskeletal:  Right knee crepitus with range of motion greater than left knee. She has a mild knee effusion. Medial joint line tenderness. No pain with hip range of motion. Straight leg raising 90 mild sciatic notch tenderness right and left. Crepitus left knee positive patellar grind test.  Anterior cruciate ligament PCL exam right left knee is normal. Distal pulses are 2+ negative Homans right and left.  Neurological: She is alert and oriented to person, place, and time.  Skin: Skin is warm and dry.  Psychiatric: She has a normal mood and affect. Her behavior is normal.    Ortho Exam  Specialty Comments:  No specialty comments available.  Imaging: No results found.   PMFS History: Patient Active Problem List   Diagnosis Date Noted  . Chronic low back pain without sciatica 07/28/2016  . Cough variant asthma vs UACS 06/24/2015  . Severe obesity (BMI >= 40) ([place]) 02/23/2015  . Dyspnea 02/22/2015  . Acute pulmonary embolism ([place]) 10/09/2014  . HTN (hypertension) 10/09/2014  . Sleep apnea in adult 10/09/2014  . Chest pain   . 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 Asthma   . Diabetes ([place])    boarderline, pt stopped Glucophage  . Hypertension    no meds  . Pulmonary embolism ([place])   . Sleep apnea     No family history on file.  Past Surgical History:  Procedure Laterality Date  . ANKLE SURGERY    . CHONDROPLASTY Right 09/21/2014   Procedure: CHONDROPLASTY right knee;  Surgeon: [name], MD;  Location: [facility];  Service: Orthopedics;  Laterality: Right;  . KNEE ARTHROSCOPY Right 09/21/2014   Procedure:  right knee arthroscopy;  Surgeon: [name], MD;  Location: [facility];  Service: Orthopedics;  Laterality: Right;  . LITHOTRIPSY    . oophrectomy    . TUBAL LIGATION     Social History   Occupational History  . unemployed    Social History Main Topics  . Smoking status: Never Smoker  . Smokeless tobacco: Never Used  . Alcohol use No  . Drug use: No  . Sexual activity: Not on file

## 2016-07-28 NOTE — Telephone Encounter (Signed)
Patient called wanting to see if her x-ray that were taken today at her appointment could be sent over to her doctor for further testing on the cyst. Doctors Fax # [phone]   Patients contact # [phone]

## 2016-08-05 NOTE — Telephone Encounter (Signed)
I left voicemail for patient advising, x-rays cannot be faxed over. Her doctor may be able to view if they can see images on [facility], or we can print copies for her to pick up. She can also call to have them burned on a CD which will cost $5.00.

## 2016-08-06 ENCOUNTER — Telehealth (INDEPENDENT_AMBULATORY_CARE_PROVIDER_SITE_OTHER): Payer: Self-pay | Admitting: Radiology

## 2016-08-06 NOTE — Telephone Encounter (Signed)
I called patient and explained what I had left her in the previous voicemail. I am unsure of who faxed anything to Dr. Fransico Setters office, as there is no documentation of that in the chart. It was my understanding from her previous message that she wanted x-ray copies. She was needing her MRI report faxed to Dr. Criss Rosales so that they can get authorization for an [place] of her kidney. MRI report faxed. Patient given the phone number to [place] so that she can request a CD if she needs it.

## 2016-08-06 NOTE — Telephone Encounter (Signed)
-----   Message from Milbert Coulter sent at 08/06/2016  4:15 PM EST ----- Regarding: Radiology Hello, I received numerous voicemails from this patient yesterday and today. When I called her she said someone faxed an MRI for her ankle to Dr. Criss Rosales, and she was supposed to be sent an MRI for her back. I can't find anything in the system or in the telephone notes that supports this? I didn't receive a request from her either.

## 2016-08-19 ENCOUNTER — Ambulatory Visit: Payer: Medicaid Other | Admitting: Pulmonary Disease

## 2016-10-27 ENCOUNTER — Ambulatory Visit (INDEPENDENT_AMBULATORY_CARE_PROVIDER_SITE_OTHER): Payer: Medicaid Other | Admitting: Orthopaedic Surgery

## 2017-01-22 ENCOUNTER — Emergency Department (HOSPITAL_COMMUNITY)
Admission: EM | Admit: 2017-01-22 | Discharge: 2017-01-22 | Disposition: A | Discharge status: Home / Self Care | Payer: Medicaid Other | Attending: Emergency Medicine | Admitting: Emergency Medicine

## 2017-01-22 ENCOUNTER — Emergency Department (HOSPITAL_COMMUNITY): Payer: Medicaid Other

## 2017-01-22 ENCOUNTER — Encounter (HOSPITAL_COMMUNITY): Payer: Self-pay | Admitting: Emergency Medicine

## 2017-01-22 DIAGNOSIS — I10 Essential (primary) hypertension: Secondary | ICD-10-CM | POA: Diagnosis not present

## 2017-01-22 DIAGNOSIS — M13861 Other specified arthritis, right knee: Secondary | ICD-10-CM | POA: Insufficient documentation

## 2017-01-22 DIAGNOSIS — Z86718 Personal history of other venous thrombosis and embolism: Secondary | ICD-10-CM | POA: Insufficient documentation

## 2017-01-22 DIAGNOSIS — Y939 Activity, unspecified: Secondary | ICD-10-CM | POA: Insufficient documentation

## 2017-01-22 DIAGNOSIS — J45909 Unspecified asthma, uncomplicated: Secondary | ICD-10-CM | POA: Insufficient documentation

## 2017-01-22 DIAGNOSIS — R42 Dizziness and giddiness: Secondary | ICD-10-CM | POA: Diagnosis not present

## 2017-01-22 DIAGNOSIS — M1990 Unspecified osteoarthritis, unspecified site: Secondary | ICD-10-CM | POA: Diagnosis not present

## 2017-01-22 DIAGNOSIS — Y998 Other external cause status: Secondary | ICD-10-CM | POA: Diagnosis not present

## 2017-01-22 DIAGNOSIS — M545 Low back pain, unspecified: Secondary | ICD-10-CM

## 2017-01-22 DIAGNOSIS — Y929 Unspecified place or not applicable: Secondary | ICD-10-CM | POA: Insufficient documentation

## 2017-01-22 DIAGNOSIS — W108XXA Fall (on) (from) other stairs and steps, initial encounter: Secondary | ICD-10-CM | POA: Diagnosis not present

## 2017-01-22 DIAGNOSIS — R350 Frequency of micturition: Secondary | ICD-10-CM | POA: Diagnosis not present

## 2017-01-22 DIAGNOSIS — M25561 Pain in right knee: Secondary | ICD-10-CM | POA: Diagnosis present

## 2017-01-22 DIAGNOSIS — M47816 Spondylosis without myelopathy or radiculopathy, lumbar region: Secondary | ICD-10-CM

## 2017-01-22 DIAGNOSIS — M62830 Muscle spasm of back: Secondary | ICD-10-CM

## 2017-01-22 DIAGNOSIS — M1711 Unilateral primary osteoarthritis, right knee: Secondary | ICD-10-CM

## 2017-01-22 DIAGNOSIS — G8929 Other chronic pain: Secondary | ICD-10-CM

## 2017-01-22 LAB — URINALYSIS, ROUTINE W REFLEX MICROSCOPIC
BILIRUBIN URINE: NEGATIVE
Glucose, UA: NEGATIVE mg/dL
HGB URINE DIPSTICK: NEGATIVE
KETONES UR: NEGATIVE mg/dL
Leukocytes, UA: NEGATIVE
NITRITE: NEGATIVE
PH: 5 (ref 5.0–8.0)
Protein, ur: NEGATIVE mg/dL
SPECIFIC GRAVITY, URINE: 1.023 (ref 1.005–1.030)

## 2017-01-22 LAB — BASIC METABOLIC PANEL
Anion gap: 6 (ref 5–15)
BUN: 8 mg/dL (ref 6–20)
CALCIUM: 9.3 mg/dL (ref 8.9–10.3)
CHLORIDE: 105 mmol/L (ref 101–111)
CO2: 29 mmol/L (ref 22–32)
CREATININE: 0.8 mg/dL (ref 0.44–1.00)
GFR calc non Af Amer: >60 mL/min (ref >60)
Glucose, Bld: 84 mg/dL (ref 65–99)
Potassium: 4 mmol/L (ref 3.5–5.1)
SODIUM: 140 mmol/L (ref 135–145)

## 2017-01-22 LAB — CBC WITH DIFFERENTIAL/PLATELET
BASOS PCT: 0 %
Basophils Absolute: 0 10*3/uL (ref 0.0–0.1)
EOS ABS: 0.4 10*3/uL (ref 0.0–0.7)
EOS PCT: 6 %
HCT: 39.6 % (ref 36.0–46.0)
Hemoglobin: 13.2 g/dL (ref 12.0–15.0)
LYMPHS ABS: 2.8 10*3/uL (ref 0.7–4.0)
Lymphocytes Relative: 40 %
MCH: 28.8 pg (ref 26.0–34.0)
MCHC: 33.3 g/dL (ref 30.0–36.0)
MCV: 86.3 fL (ref 78.0–100.0)
MONO ABS: 0.4 10*3/uL (ref 0.1–1.0)
MONOS PCT: 6 %
Neutro Abs: 3.4 10*3/uL (ref 1.7–7.7)
Neutrophils Relative %: 48 %
Platelets: 268 10*3/uL (ref 150–400)
RBC: 4.59 MIL/uL (ref 3.87–5.11)
RDW: 14.4 % (ref 11.5–15.5)
WBC: 6.9 10*3/uL (ref 4.0–10.5)

## 2017-01-22 LAB — I-STAT TROPONIN, ED: TROPONIN I, POC: 0 ng/mL (ref 0.00–0.08)

## 2017-01-22 MED ORDER — CYCLOBENZAPRINE HCL 10 MG PO TABS: 10.0000 mg | ORAL_TABLET | Freq: Three times a day (TID) | ORAL | 0 refills | Status: DC | PRN

## 2017-01-22 MED ORDER — HYDROCODONE-ACETAMINOPHEN 5-325 MG PO TABS
1.0000 | ORAL_TABLET | Freq: Once | ORAL | Status: AC
  Administered 2017-01-22: 1 via ORAL
  Filled 2017-01-22: qty 1

## 2017-01-22 NOTE — ED Notes (Signed)
Patient transported to X-ray 

## 2017-01-22 NOTE — ED Provider Notes (Signed)
[facility] DEPT Provider Note   CSN: [id number] Arrival date & time: 01/22/17  1011     History   Chief Complaint Chief Complaint  Patient presents with  . Fall    HPI Patricia Ramsey is a 51 y.o. female with a PMHx of asthma, borderline diabetes, HTN, chronic back pain with sciatica, chronic right knee pain s/p arthroscopy and chondroplasty, and PE on eliquis, who presents to the ED with complaints of mechanical fall this morning around 7:30 AM. Patient states that she was walking down her stairs when she missed a step with her foot, causing her to fall forward down about 5 steps and landing on her right knee and arm on the carpeted floor. She c/o low back pain that was aggravated during the fall, and R knee pain/swelling. She denies hitting her head or any loss of consciousness. She denies any prodromal symptoms, however she mentions that she has had intermittent lightheadedness for "a while", which mostly happens in the mornings when she first wakes up and then self resolves fairly quickly. She states that she has been under a lot of stress recently because her daughter was murdered less than a year ago and they have not found the culprit, which has placed a fair amount of stress on her. She believes this is why she has the lightheadedness issue. She is seeing a therapist and her PCP who are taking care of this issue. She denies feeling lightheaded prior to the fall. She describes her lower back pain as 8/10 constant aching nonradiating diffuse lower back pain that worsens with movement, and she has not tried anything for her symptoms prior to arrival. She states that rest improves her back pain. She also reports throbbing and burning right knee pain as well as swelling. Lastly she mentions that she has had 3-4 days of increased urinary frequency. She is compliant on her eliquis.   She denies any recent fevers, chills, head inj/LOC, abrasions/lacerations, CP, SOB, abd pain, N/V/D/C,  melena, hematochezia, hematuria, dysuria, malodorous urine, bruising, numbness, tingling, focal weakness, incontinence of urine/stool, saddle anesthesia/cauda equina symptoms, HA, or any other complaints at this time.    The history is provided by the patient and medical records. No language interpreter was used.  Back Pain   This is a chronic problem. The current episode started 6 to 12 hours ago. The problem occurs constantly. The problem has not changed since onset.The pain is associated with falling. The pain is present in the lumbar spine. The quality of the pain is described as aching. The pain does not radiate. The pain is at a severity of 8/10. The pain is moderate. Exacerbated by: movement. The pain is the same all the time. Pertinent negatives include no chest pain, no fever, no numbness, no headaches, no abdominal pain, no bowel incontinence, no perianal numbness, no bladder incontinence, no dysuria, no paresthesias, no paresis, no tingling and no weakness. She has tried nothing for the symptoms. The treatment provided no relief. Risk factors include obesity.    Past Medical History:  Diagnosis Date  . Asthma   . Diabetes ([place])    boarderline, pt stopped Glucophage  . Hypertension    no meds  . Pulmonary embolism ([place])   . Sleep apnea     Patient Active Problem List   Diagnosis Date Noted  . Chronic low back pain without sciatica 07/28/2016  . Cough variant asthma vs UACS 06/24/2015  . Severe obesity (BMI >= 40) ([place]) 02/23/2015  .  Dyspnea 02/22/2015  . Acute pulmonary embolism ([place]) 10/09/2014  . HTN (hypertension) 10/09/2014  . Sleep apnea in adult 10/09/2014  . Chest pain   . Chondromalacia of right knee 09/21/2014    Past Surgical History:  Procedure Laterality Date  . ANKLE SURGERY    . CHONDROPLASTY Right 09/21/2014   Procedure: CHONDROPLASTY right knee;  Surgeon: [name], MD;  Location: [facility];  Service: Orthopedics;  Laterality: Right;  .  KNEE ARTHROSCOPY Right 09/21/2014   Procedure:  right knee arthroscopy;  Surgeon: [name], MD;  Location: [facility];  Service: Orthopedics;  Laterality: Right;  . LITHOTRIPSY    . oophrectomy    . TUBAL LIGATION      OB History    No data available       Home Medications    Prior to Admission medications   Medication Sig Start Date End Date Taking? Authorizing Provider  albuterol (PROVENTIL) (2.5 MG/3ML) 0.083% nebulizer solution Take 3 mLs (2.5 mg total) by nebulization every 6 (six) hours as needed for wheezing or shortness of breath. 06/21/15   Fransico Meadow, PA-C  apixaban (ELIQUIS) 5 MG TABS tablet Take 1 tablet (5 mg total) by mouth 2 (two) times daily. 06/24/15   Tanda Rockers, MD  famotidine (PEPCID) 20 MG tablet take 1 tablet by mouth at bedtime 04/28/16   Chesley Mires, MD  gabapentin (NEURONTIN) 300 MG capsule Take 1 capsule (300 mg total) by mouth 3 (three) times daily. 10/13/14   Reyne Dumas, MD  hydrochlorothiazide (HYDRODIURIL) 25 MG tablet Take 25 mg by mouth daily. 02/07/15   [provider]  methylPREDNISolone (MEDROL) 2 MG tablet 6 day taper take as directed 05/05/16   Lanae Crumbly, PA-C  mometasone (NASONEX) 50 MCG/ACT nasal spray Place 2 sprays into the nose daily. 10/08/15   Parrett, Fonnie Mu, NP  mometasone-formoterol (DULERA) 100-5 MCG/ACT AERO Take 2 puffs first thing in am and then another 2 puffs about 12 hours later. 10/08/15   Parrett, Fonnie Mu, NP  montelukast (SINGULAIR) 10 MG tablet Take 1 tablet (10 mg total) by mouth at bedtime. Reported on 10/08/2015 10/08/15   Parrett, Fonnie Mu, NP  pantoprazole (PROTONIX) 40 MG tablet take 1 tablet by mouth once daily TAKE 30-60 MINUTES BEFORE FIRST MEAL OF THE DAY 04/28/16   Chesley Mires, MD  sodium chloride (OCEAN) 0.65 % SOLN nasal spray Place 1 spray into both nostrils as needed for congestion. 10/13/14   Reyne Dumas, MD    Family History No family history on file.  Social History Social  History  Substance Use Topics  . Smoking status: Never Smoker  . Smokeless tobacco: Never Used  . Alcohol use No     Allergies   Patient has no known allergies.   Review of Systems Review of Systems  Constitutional: Negative for chills and fever.  HENT: Negative for facial swelling (no head inj).   Respiratory: Negative for shortness of breath.   Cardiovascular: Negative for chest pain.  Gastrointestinal: Negative for abdominal pain, blood in stool, bowel incontinence, constipation, diarrhea, nausea and vomiting.  Genitourinary: Positive for frequency. Negative for bladder incontinence, difficulty urinating (no incontinence), dysuria and hematuria.       No malodorous urine  Musculoskeletal: Positive for arthralgias, back pain and joint swelling.  Skin: Negative for color change and wound.  Allergic/Immunologic: Negative for immunocompromised state.  Neurological: Positive for light-headedness (intermittent in the mornings for "a while"). Negative for  tingling, syncope, weakness, numbness, headaches and paresthesias.  Hematological: Bruises/bleeds easily (on eliquis).  Psychiatric/Behavioral: Negative for confusion.   All other systems reviewed and are negative for acute change except as noted in the HPI.    Physical Exam Updated Vital Signs BP (!) 143/102   Pulse 76   Temp 97.9 F (36.6 C) (Oral)   Resp 12   SpO2 96%   Physical Exam  Constitutional: She is oriented to person, place, and time. Vital signs are normal. She appears well-developed and well-nourished.  Non-toxic appearance. No distress.  Afebrile, nontoxic, NAD  HENT:  Head: Normocephalic and atraumatic. Head is without raccoon's eyes, without Battle's sign, without abrasion and without contusion.  Mouth/Throat: Oropharynx is clear and moist and mucous membranes are normal.  [place]/AT, no abrasions/contusions, no battle's sign or raccoon eyes, no s/sx of basilar skull fx  Eyes: Conjunctivae and EOM are normal.  Right eye exhibits no discharge. Left eye exhibits no discharge.  Neck: Normal range of motion. Neck supple. No spinous process tenderness and no muscular tenderness present. No neck rigidity. Normal range of motion present.  FROM intact without spinous process TTP, no bony stepoffs or deformities, no paraspinous muscle TTP or muscle spasms. No rigidity or meningeal signs. No bruising or swelling.   Cardiovascular: Normal rate, regular rhythm, normal heart sounds and intact distal pulses.  Exam reveals no gallop and no friction rub.   No murmur heard. Pulmonary/Chest: Effort normal and breath sounds normal. No respiratory distress. She has no decreased breath sounds. She has no wheezes. She has no rhonchi. She has no rales.  Abdominal: Soft. Normal appearance and bowel sounds are normal. She exhibits no distension. There is no tenderness. There is no rigidity, no rebound, no guarding, no CVA tenderness, no tenderness at McBurney's point and negative Murphy's sign.  Musculoskeletal: Normal range of motion.       Right knee: She exhibits normal range of motion, no swelling, no effusion, no ecchymosis, no deformity, no laceration, no erythema, normal alignment, no LCL laxity, normal patellar mobility and no MCL laxity. Tenderness found.       Lumbar back: She exhibits tenderness, bony tenderness and spasm. She exhibits normal range of motion, no swelling and no deformity.       Back:  Lumbar spine with FROM intact with mild midline spinous process TTP around L2-4, no bony stepoffs or deformities, with diffuse b/l paraspinous muscle TTP and a few palpable muscle spasms. Strength and sensation grossly intact in all extremities, negative SLR bilaterally. No overlying skin changes.  R knee with FROM intact, with mild joint line TTP, no calf/thigh tenderness, no swelling/effusion/deformity, no bruising or erythema, no abrasions or warmth, no abnormal alignment or patellar mobility, no varus/valgus laxity, neg  anterior drawer test, no crepitus.  Strength and sensation grossly intact, distal pulses intact, compartments soft   Neurological: She is alert and oriented to person, place, and time. She has normal strength. No sensory deficit.  Skin: Skin is warm, dry and intact. No rash noted.  Psychiatric: She has a normal mood and affect.  Nursing note and vitals reviewed.    ED Treatments / Results  Labs (all labs ordered are listed, but only abnormal results are displayed) Labs Reviewed  CBC WITH DIFFERENTIAL/PLATELET  BASIC METABOLIC PANEL  URINALYSIS, ROUTINE W REFLEX MICROSCOPIC  I-STAT TROPONIN, ED    EKG  EKG Interpretation  Date/Time:  Friday January 22 2017 14:09:42 EDT Ventricular Rate:  64 PR Interval:  158 QRS Duration:  74 QT Interval:  408 QTC Calculation: 420 R Axis:   26 Text Interpretation:  Normal sinus rhythm Low voltage QRS Nonspecific T wave abnormality Abnormal ECG Since last tracing rate slower no brugada, wpw, or prolonged qt Otherwise no significant change Confirmed by Deno Etienne [id number]) on 01/22/2017 3:10:50 PM       Radiology Dg Lumbar Spine Complete  Result Date: 01/22/2017 CLINICAL DATA:  Acute low back pain after fall down stairs today. EXAM: LUMBAR SPINE - COMPLETE 4+ VIEW COMPARISON:  MRI of July 11, 2016. FINDINGS: No fracture or spondylolisthesis is noted. Disc spaces are well-maintained. Minimal mild anterior osteophyte formation is noted at L1-2 and L3-4. Degenerative changes seen involving posterior facet joints of L5-S1. IMPRESSION: Minimal to mild degenerative changes as described above. No acute abnormality seen in the lumbar spine. Electronically Signed   By: Marijo Conception, M.D.   On: 01/22/2017 13:53   Dg Knee Complete 4 Views Right  Result Date: 01/22/2017 CLINICAL DATA:  Right knee pain after fall down stairs today. EXAM: RIGHT KNEE - COMPLETE 4+ VIEW COMPARISON:  Radiographs of January 03, 2016. FINDINGS: No evidence of fracture,  dislocation, or joint effusion. Minimal joint space narrowing is noted medial with osteophyte formation. Soft tissues are unremarkable. IMPRESSION: Minimal degenerative joint disease. No acute abnormality seen in the right knee. Electronically Signed   By: Marijo Conception, M.D.   On: 01/22/2017 13:48    Procedures Procedures (including critical care time)  Medications Ordered in ED Medications  HYDROcodone-acetaminophen (NORCO/VICODIN) 5-325 MG per tablet 1 tablet (1 tablet Oral Given 01/22/17 1258)     Initial Impression / Assessment and Plan / ED Course  I have reviewed the triage vital signs and the nursing notes.  Pertinent labs & imaging results that were available during my care of the patient were reviewed by me and considered in my medical decision making (see chart for details).     51 y.o. female here with mechanical fall down 5 steps this morning, reports that her foot missed a step and she fell forward, states she landed on her R knee and arm, and also aggravated her back. On eliquis however denies head inj or LOC. Has been stressed recently due to her daughter's murder almost a year ago, and has had lightheadedness for a while, mostly in the mornings after she wakes up. Denies feeling prodromal symptoms before the fall today. Also mentions urinary frequency for a few days. On exam, mild mid-lumbar spinal tenderness and diffuse b/l paraspinous muscle TTP and spasm, mild R knee tenderness, no swelling or bruising, extremities NVI with soft compartments. Doubt need for head imaging, however will get basic labs, EKG, U/A, and xray of knee and lumbar spine. Pain meds given. Will reassess shortly.   4:57 PM CBC w/diff, BMP, U/A, and Trop all unremarkable. EKG unremarkable and nonischemic. R knee xray with mild degenerative findings, no acute findings. Lumbar spine xray with mild degenerative findings as well. Overall reassuring work up. Likely contusion and strain of knee and lumbar  spine. Will provide knee sleeve for comfort, advised RICE, tylenol use discussed, will rx flexeril, discussed use of heat to her back. F/up with PCP in 5-7 days for recheck of symptoms, and ongoing management of her intermittent lightheadedness, which is likely stress related. Doubt need for further emergent work up at this time. I explained the diagnosis and have given explicit precautions to return to the ER including for any other new or worsening symptoms. The  patient understands and accepts the medical plan as it's been dictated and I have answered their questions. Discharge instructions concerning home care and prescriptions have been given. The patient is STABLE and is discharged to home in good condition.    Final Clinical Impressions(s) / ED Diagnoses   Final diagnoses:  Fall (on) (from) other stairs and steps, initial encounter  Arthritis of right knee  Chronic bilateral low back pain without sciatica  Urinary frequency  Intermittent lightheadedness  Arthritis of lumbar spine (HCC)  Back muscle spasm    New Prescriptions New Prescriptions   CYCLOBENZAPRINE (FLEXERIL) 10 MG TABLET    Take 1 tablet (10 mg total) by mouth 3 (three) times daily as needed for muscle spasms.     [address] [facility] Less[address] Stigler, [place], [place] 01/22/17 [place], [place], DO 01/25/17 [phone]

## 2017-01-22 NOTE — ED Notes (Signed)
ED Provider at bedside. 

## 2017-01-22 NOTE — ED Triage Notes (Signed)
Pt reports she is on eloquis, states she fell down 5 steps this morning, thinks she tripped, denies loc or hitting her head. Reports right knee and back pain.

## 2017-01-22 NOTE — Discharge Instructions (Signed)
Your work up today has been reassuring. Use tylenol as needed for pain. Use flexeril as directed as needed for muscle relaxation. Do not drive or operate machinery with muscle relaxant use. Use knee sleeve as needed for comfort. Ice to areas of soreness and elevate your knee to help with pain/swelling; use heat to your back. Expect to be sore for the next few days and follow up with primary care physician for recheck of ongoing symptoms in the next 1 week. Return to ER for emergent changing or worsening of symptoms.

## 2017-01-25 ENCOUNTER — Other Ambulatory Visit: Payer: Self-pay | Admitting: Pulmonary Disease

## 2017-01-25 DIAGNOSIS — R0600 Dyspnea, unspecified: Secondary | ICD-10-CM

## 2017-02-02 ENCOUNTER — Ambulatory Visit (INDEPENDENT_AMBULATORY_CARE_PROVIDER_SITE_OTHER): Payer: Medicaid Other | Admitting: Orthopaedic Surgery

## 2017-02-02 ENCOUNTER — Encounter (INDEPENDENT_AMBULATORY_CARE_PROVIDER_SITE_OTHER): Payer: Self-pay | Admitting: Orthopaedic Surgery

## 2017-02-02 VITALS — BP 137/100 | HR 84 | Ht 63.0 in | Wt 215.0 lb

## 2017-02-02 DIAGNOSIS — M94261 Chondromalacia, right knee: Secondary | ICD-10-CM
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
## 2017-05-10 ENCOUNTER — Ambulatory Visit: Payer: Self-pay | Admitting: Internal Medicine

## 2017-05-17 ENCOUNTER — Encounter: Payer: Self-pay | Admitting: Internal Medicine

## 2017-05-17 ENCOUNTER — Ambulatory Visit: Payer: Medicaid Other | Admitting: Internal Medicine

## 2017-05-17 VITALS — BP 134/90 | HR 78 | Ht 63.0 in | Wt 245.6 lb

## 2017-05-17 DIAGNOSIS — Z23 Encounter for immunization: Secondary | ICD-10-CM

## 2017-05-17 DIAGNOSIS — G4730 Sleep apnea, unspecified: Secondary | ICD-10-CM | POA: Diagnosis not present

## 2017-05-17 DIAGNOSIS — J45991 Cough variant asthma: Secondary | ICD-10-CM

## 2017-05-17 MED ORDER — ALBUTEROL SULFATE HFA 108 (90 BASE) MCG/ACT IN AERS: INHALATION_SPRAY | RESPIRATORY_TRACT | 1 refills | Status: DC

## 2017-05-17 MED ORDER — ALBUTEROL SULFATE HFA 108 (90 BASE) MCG/ACT IN AERS: INHALATION_SPRAY | RESPIRATORY_TRACT | Status: DC

## 2017-05-17 MED ORDER — BUDESONIDE-FORMOTEROL FUMARATE 80-4.5 MCG/ACT IN AERO: 2.0000 | INHALATION_SPRAY | Freq: Two times a day (BID) | RESPIRATORY_TRACT | 0 refills | Status: DC

## 2017-05-17 NOTE — Patient Instructions (Addendum)
Plan A = Automatic = dulera 100 = symbicort 80 :  Take 2 puffs first thing in am and then another 2 puffs about 12 hours later.    Work on inhaler technique:  relax and gently blow all the way out then take a nice smooth deep breath back in, triggering the inhaler at same time you start breathing in.  Hold for up to 5 seconds if you can. Blow out thru nose. Rinse and gargle with water when done     Plan B = Backup Only use your albuterol as a rescue medication to be used if you can't catch your breath by resting or doing a relaxed purse lip breathing pattern.  - The less you use it, the better it will work when you need it. - Ok to use the inhaler up to 2 puffs  every 4 hours if you must but call for appointment if use goes up over your usual need - Don't leave home without it !!  (think of it like the spare tire for your car)   Plan C = Crisis - only use your albuterol nebulizer if you first try Plan B and it fails to help > ok to use the nebulizer up to every 4 hours but if start needing it regularly call for immediate appointment   Resume cpap as much as you can   Please schedule a follow up visit in 3 months but call sooner if needed - to see Dr Halford Chessman

## 2017-05-17 NOTE — Assessment & Plan Note (Signed)
Body mass index is 43.51 kg/m.  -  trending down/ encouraged  No results found for: TSH   Contributing to gerd/ djd  risk/ doe/reviewed the need and the process to achieve and maintain neg calorie balance > defer f/u primary care including intermittently monitoring thyroid status

## 2017-05-17 NOTE — Assessment & Plan Note (Signed)
06/24/2015  > try dulera 100 2bid - Spirometry 11/19/2015  No obst  - 05/17/2017  After extensive coaching inhaler device  effectiveness =    75% from a baseline of 25%   Having freq flares req steroids and has no rescue inhaler/ poor baseline hfa so needed quite a bit of re-education today  = Using the rule of 2's to indicate good control   I had an extended discussion with the patient reviewing all relevant studies completed to date and  lasting 15 to 20 minutes of a 25 minute visit    Each maintenance medication was reviewed in detail including most importantly the difference between maintenance and prns and under what circumstances the prns are to be triggered using an action plan format that is not reflected in the computer generated alphabetically organized AVS.    Please see AVS for specific instructions unique to this visit that I personally wrote and verbalized to the the pt in detail and then reviewed with pt  by my nurse highlighting any  changes in therapy recommended at today's visit to their plan of care.

## 2017-05-17 NOTE — Assessment & Plan Note (Signed)
[facility]worth 11/19/2015  = 20  - split night  11/22/15 Severe obstructive sleep apnea occurred during the diagnostic portion of the study (AHI = 42.6/hour). An optimal PAP pressure was selected for this patient ( 14 cm of water) - No significant central sleep apnea occurred during the diagnostic portion of the study (CAI = 0.0/hour). - Severe oxygen desaturation was noted during the diagnostic portion of the study (Min O2 = 52.00%).>  [employer] and Rx    Doing well when uses cpap > needs yearly re-eval by Dr Halford Chessman, no change in rx in meantime

## 2017-05-17 NOTE — Progress Notes (Signed)
Subjective:    Patient ID: Patricia Ramsey, female    DOB: June 26, 1965     MRN: [id number]    Brief patient profile:  62 yobf never smoker with h/o asthma since childhood seasonal problems prn saba maybe once a week  then fell and R knee injury at wt 220 then surgery September 21 2014 cone then severe sob  Oct 09 2014 dx dvt  R leg and   PE >  Still sob and L cp > eval in pulmonary clinic for the first time 02/22/15    History of Present Illness  02/22/2015 1st [facility] office visit/ [facility]   Chief Complaint  Patient presents with  . Pulmonary Consult    Self referral. Pt c/o SOB and left side pain since dx of PE in May 2016. She gets out of breath walking to the mailbox. She also c/o cough- non prod "but sometimes I taste blood".   says was seen multiple times for cp and sob before dx of PE on May 10 but nothing to support this in EPIC and says only 50% better sob and no better L cp which is more positional than pleuritic, hurts worse L side down.  Was very inactive for months before knee surgery and the last time she could walk to mailbox s sob was prior to the knee injury in oct 2015   Says asthma poorly controlled for months with dry day > noct cough and sense of wheezing on singulair and freq saba  rec Please see patient coordinator before you leave today  to schedule CTa chest and venous dopplers >   Repeat 03/01/15 : No evidence of right lower extremity deep or superficial acute or residual venous thrombus or incompetence. Cta repeat 03/01/2015 > Interval resolution of previously noted pulmonary emb Pantoprazole (protonix) 40 mg   Take  30-60 min before first meal of the day and Pepcid (famotidine)  20 mg one @  Bedtime   GERD diet  Late add : echo Repeat 02/28/15 wnl    03/25/2015  f/u ov/[facility]ert re: dyspnea/ cough PE may 2016 on eliquis  Chief Complaint  Patient presents with  . Follow-up    Dyspnea. Pt c/o of occasional cough. Pt states that her dyspnea has improved.    tolerating more activity but still sob > slow paced walking/ mostly daytime, mostly dry coughing> not needing any form of saba at this point  rec Work on wt   06/24/2015 acute extended ov/[facility]ert re: cough/ wheeze / now maint on singulair Chief Complaint  Patient presents with  . Acute Visit    Pt c/o increased SOB and wheezing x 4 weeks. Pt notes clear mucus production with cough; started with yellow. Currently taking Pred taper and Zpak.   onset was in November 2016 while on gerd rx >> gradually wore so went to ER [facility] 06/04/14 > rx nebs/steroids>>  cough improved and felt better but still felt herself  wheezing   despite alb/prednisone > 06/20/14 > some better now  rec Finish prednisone  Plan A = automatic = dulera 100 Take 2 puffs first thing in am and then another 2 puffs about 12 hours later.  Plan B = Backup Only use your albuterol (proair) as a rescue medication  Plan C = crisis Only use the albuterol neb if you try the proair and it's doesn't work    10/08/2015 NP Acute OV  Pt presents for an acute office visit. Complains of chest congestion/tightness, SOB,  wheezing, prod cough with clear mucus, sinus drainage/pressure starting on 10/03/15.  Using more albuterol over last few days. Remains on Dulera and Singulair.  Ran out of PPI /Pepcid , refill ran out.  Denies any fever, nausea or vomiting, hemoptysis , orthopnea or edema.  Says she was sick in March like this, resolved with oc cold meds. No abx needed.  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  Pt with hx of PE in 2016  On Eliquis  No chest pain, or hemoptyiss .  Does report she take etodolac and ibuprofen for knee pain on occasion.  We discussed dangers of NSAID and NOAC . She will stop NSAIDS now.  Advised to follow up with PCP Manson Passey for knee issues.  rec Stop Etodolac. Do not take ibuprofen, aleve, etc. NSAIDS .  Zpack take as directed.  Prednisone taper over next week.  Mucinex DM Twice daily  As needed   Cough/congestion  Restart Protonix and Pepcid.  May use Tramadol As needed  Severe cough , may make you sleepy.  Continue on Dulera 2 puffs Twice daily  , rinse after use.  Add zyrtec 10mg  At bedtime .  Add Nasonex 2 puffs daily       11/19/2015  f/u ov/Wert re:  Obesity/ cough variant asthma/ pe/dvt  Chief Complaint  Patient presents with  . Follow-up    Breathing has improved some, but not back to normal baseline. She is coughing less. She states wakes up with HA every day and is conderned b/c daughter tells her she stops breathing while asleep.   doe = MMRC3 = can't walk 100 yards even at a slow pace at a flat grade s stopping due to sob  - can't do HT s stopping / lean on cart/also limited by back pain  Only using saba if over does it, not noct  Sleeps poorly wakes up choking/ am ha better as day goes on / already dx as osa on cpap but stopped around 2008-9  rec Sleep eval > [facility] > rec 14 cm cpap      05/17/2017  f/u ov/Wert re:  Mild chronic asthma/ very poor adherence and hfa technique/ did not use dulera am of ov  Chief Complaint  Patient presents with  . Follow-up    She states that her breathing is overall doing well. She had some wheezing approx 1 wk ago and had to use her neb. She does not have a rescue inhaler. She admits she has not been very compliant with CPAP and has been waking up with HA's.   does fine sleeping as long as on cpap  L knee and back/back stop her before breathing  Has freq flares requiring systemic steroids and gaining wt   No obvious day to day or daytime variability or assoc excess/ purulent sputum or mucus plugs or hemoptysis or cp or chest tightness, subjective wheeze or overt sinus or hb symptoms. No unusual exposure hx or h/o childhood pna  or knowledge of premature birth.  Sleeping ok just while on cpap  without nocturnal  or early am exacerbation  of respiratory  c/o's or need for noct saba. Also denies any obvious fluctuation of symptoms  with weather or environmental changes or other aggravating or alleviating factors except as outlined above   Current Allergies, Complete Past Medical History, Past Surgical History, Family History, and Social History were reviewed in [employer] record.  ROS  The following are not active  complaints unless bolded Hoarseness, sore throat, dysphagia, dental problems, itching, sneezing,  nasal congestion or discharge of excess mucus or purulent secretions, ear ache,   fever, chills, sweats, unintended wt loss or wt gain, classically pleuritic or exertional cp,  orthopnea pnd or leg swelling, presyncope, palpitations, abdominal pain, anorexia, nausea, vomiting, diarrhea  or change in bowel habits or change in bladder habits, change in stools or change in urine, dysuria, hematuria,  rash, arthralgias, visual complaints, headache, numbness, weakness or ataxia or problems with walking or coordination,  change in mood/affect or memory.        Current Meds  Medication Sig  . albuterol (PROVENTIL) (2.5 MG/3ML) 0.083% nebulizer solution Take 3 mLs (2.5 mg total) by nebulization every 6 (six) hours as needed for wheezing or shortness of breath.  . ALPRAZolam (XANAX) 0.5 MG tablet Take 0.5 mg by mouth at bedtime.  Marland Kitchen apixaban (ELIQUIS) 5 MG TABS tablet Take 1 tablet (5 mg total) by mouth 2 (two) times daily.  . famotidine (PEPCID) 20 MG tablet take 1 tablet by mouth at bedtime  . gabapentin (NEURONTIN) 300 MG capsule Take 1 capsule (300 mg total) by mouth 3 (three) times daily.  . hydrochlorothiazide (HYDRODIURIL) 25 MG tablet Take 25 mg by mouth daily.  . Misc. Devices MISC by Does not apply route at bedtime. CPAP machine  . mometasone (NASONEX) 50 MCG/ACT nasal spray Place 2 sprays into the nose daily. (Patient taking differently: Place 2 sprays into the nose daily as needed. )  . mometasone-formoterol (DULERA) 100-5 MCG/ACT AERO Take 2 puffs first thing in am and then another 2 puffs  about 12 hours later.  . montelukast (SINGULAIR) 10 MG tablet Take 1 tablet (10 mg total) by mouth at bedtime. Reported on 10/08/2015  . pantoprazole (PROTONIX) 40 MG tablet take 1 tablet by mouth once daily 30 TO 60 MINUTES BEFORE THE FIRST MEAL OF THE DAY  . sodium chloride (OCEAN) 0.65 % SOLN nasal spray Place 1 spray into both nostrils as needed for congestion.                          Objective:   Physical Exam  Somber amb wf nad   03/25/2015      249 > 06/24/2015   258 > 11/19/2015   264 > 05/17/2017   245    Vital signs reviewed - Note on arrival 02 sats  97% on RA     HEENT: nl dentition, turbinates, and orophanx. Nl external ear canals without cough reflex - Modified Mallampati Score = 3      HEENT: nl dentition, turbinates bilaterally, and oropharynx. Nl external ear canals without cough reflex Modified Mallampati Score =   3   NECK :  without JVD/Nodes/TM/ nl carotid upstrokes bilaterally   LUNGS: no acc muscle use,  Nl contour chest which is clear to A and P bilaterally without cough on insp or exp maneuvers   CV:  RRR  no s3 or murmur or increase in P2, and no edema   ABD:  Quite obese but  soft and nontender with limited inspiratory excursion in the supine position. No bruits or organomegaly appreciated, bowel sounds nl  MS:  Nl gait/ ext warm without deformities, calf tenderness, cyanosis or clubbing No obvious joint restrictions   SKIN: warm and dry without lesions    NEURO:  alert, approp, nl sensorium with  no motor or cerebellar deficits apparent.  Assessment & Plan:

## 2017-06-02 ENCOUNTER — Telehealth: Payer: Self-pay | Admitting: Internal Medicine

## 2017-06-02 MED ORDER — AZITHROMYCIN 250 MG PO TABS: ORAL_TABLET | ORAL | 0 refills | Status: AC

## 2017-06-02 NOTE — Telephone Encounter (Signed)
Spoke with pt. She is aware of RA's recommendations. We do not have any available appointments at this time. Rx has been sent in. Nothing further was needed.

## 2017-06-02 NOTE — Telephone Encounter (Signed)
Spoke with patient Pt reports hoarseness, throat hurts, having trouble swallowing, has productive cough-yellow and white mucus.  Pt has sweats, chills, weakness, body aches and feels she has a fever, but no way of checking if she has fever. Pt has tried mucinex, no relief. Pt states symptoms x 3 days  RA please advise

## 2017-06-02 NOTE — Telephone Encounter (Signed)
Schedule office visit ASAP. If unable then call in Z-Pak

## 2017-06-22 IMAGING — MR MR LUMBAR SPINE W/O CM
4 of 5 series · 22 of 48 positions shown · non-contrast
Comparison: Lumbar spine radiographs 01/17/2014.

CLINICAL DATA: Chronic low back pain for 3 years. Pain radiates to
the LEFT leg with weakness.

EXAM:
MRI LUMBAR SPINE WITHOUT CONTRAST
TECHNIQUE: Multiplanar, multisequence MR imaging of the lumbar spine was
performed. No intravenous contrast was administered.

[Series 7: T1 · sagittal · 4.0mm · 0.73mm/px · 4 of 15 slices shown (1 of 2)]
[im 1/15]
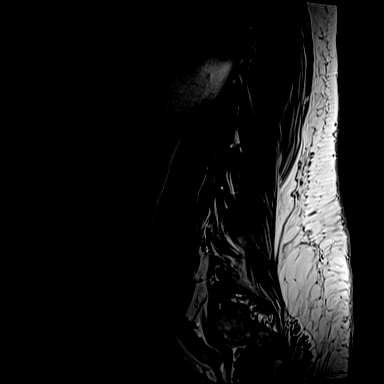
[im 3/15]
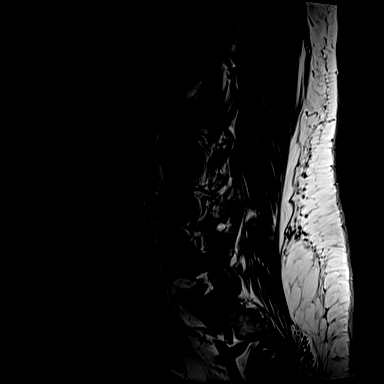
[im 8/15]
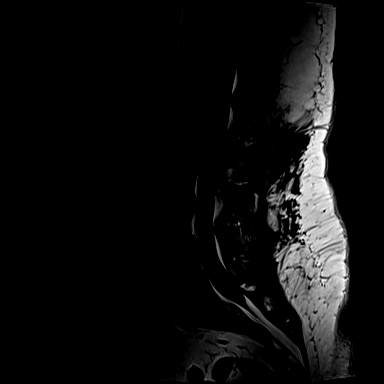
[im 12/15]
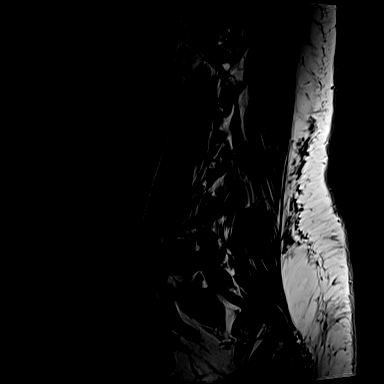

[Series 8: T2 · sagittal · 4.0mm · 0.73mm/px · 7 of 15 slices shown (1 of 2)]
[im 1/15]
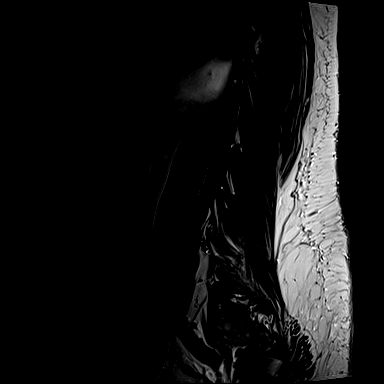
[im 3/15]
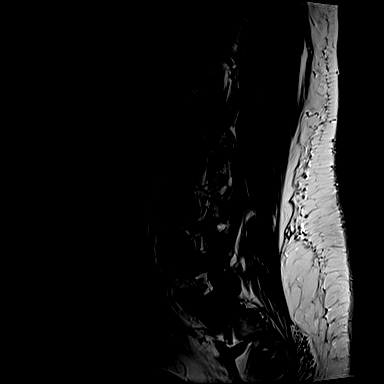
[im 5/15]
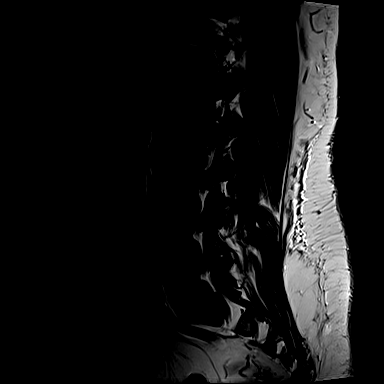
[im 8/15]
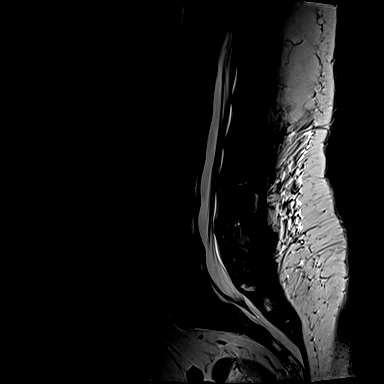
[im 10/15]
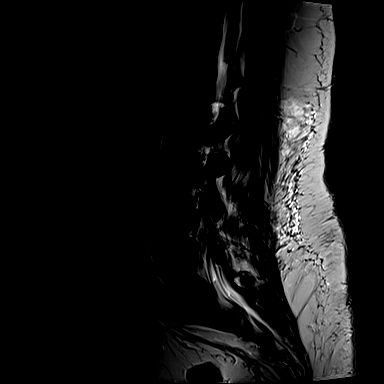
[im 12/15]
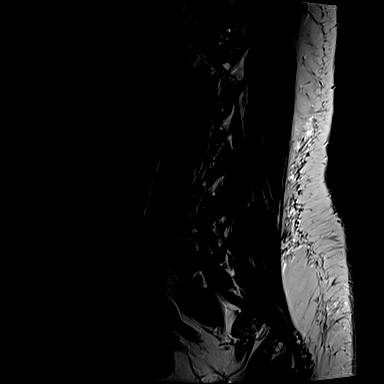
[im 15/15]
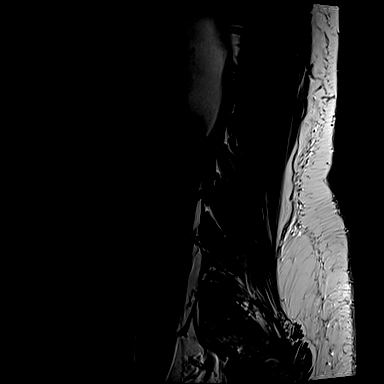

[Series 9: T1 · axial · 4.0mm · 0.56mm/px · z∈[+71,+202]mm · 3 of 32 slices shown (2 of 2)]
[im 5/32]
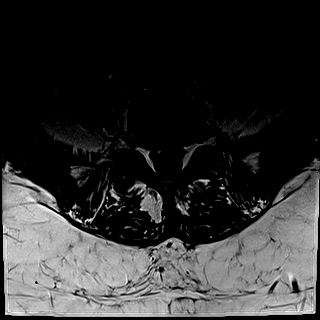
[im 17/32]
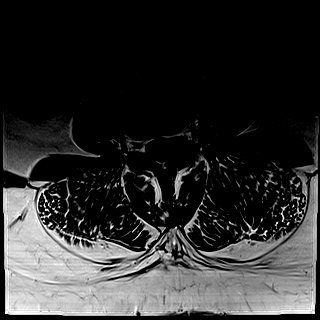
[im 27/32]
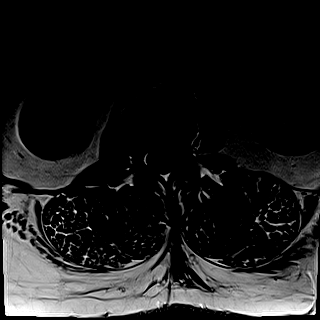

[Series 10: T2 · axial · 4.0mm · 0.28mm/px · z∈[+52,+227]mm · 8 of 32 slices shown (2 of 2)]
[im 1/32]
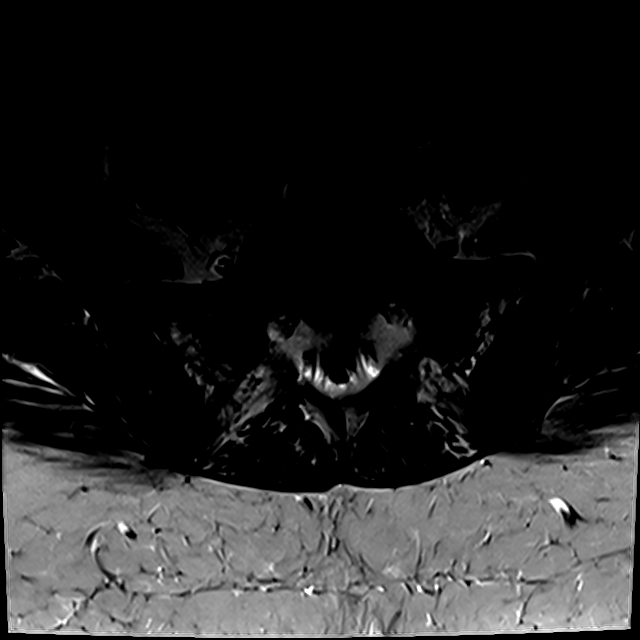
[im 5/32]
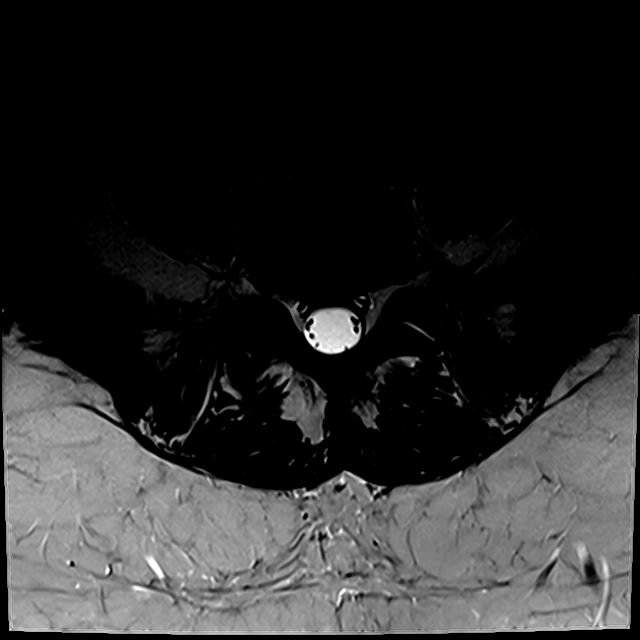
[im 10/32]
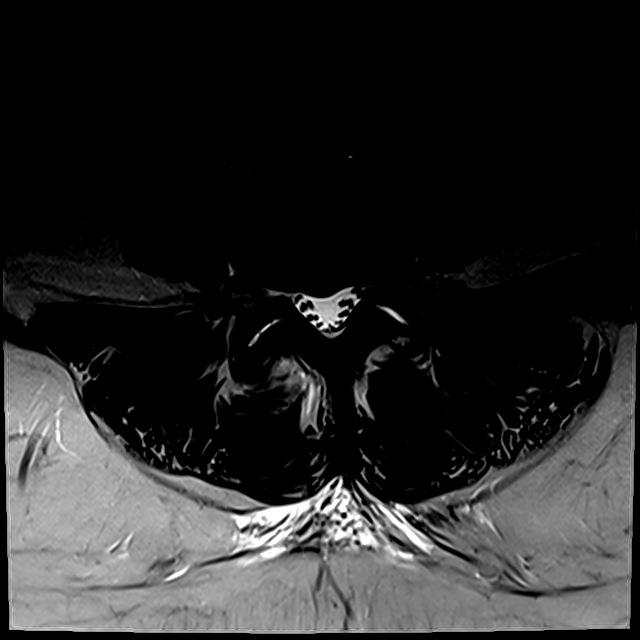
[im 15/32]
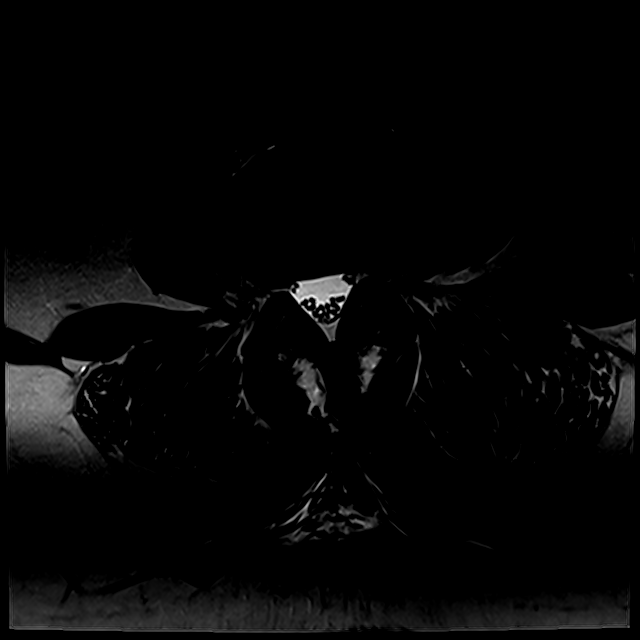
[im 17/32]
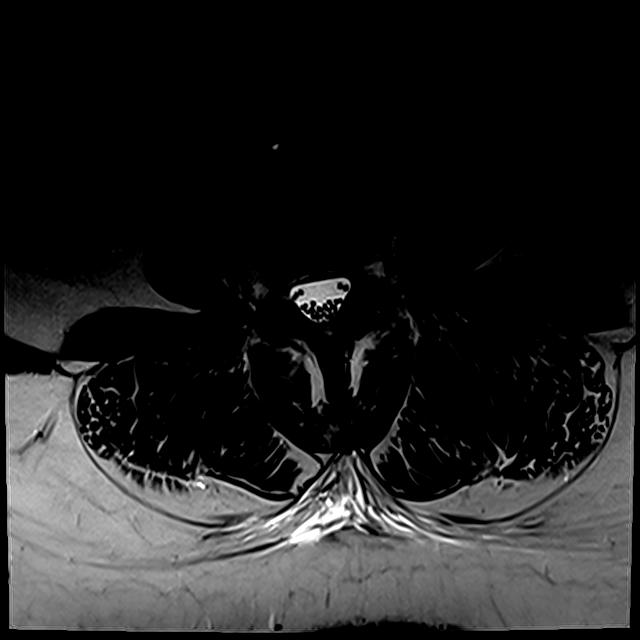
[im 22/32]
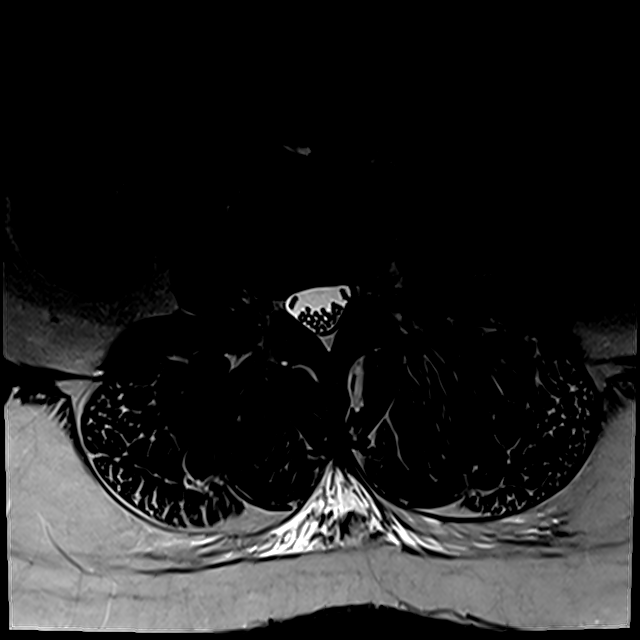
[im 27/32]
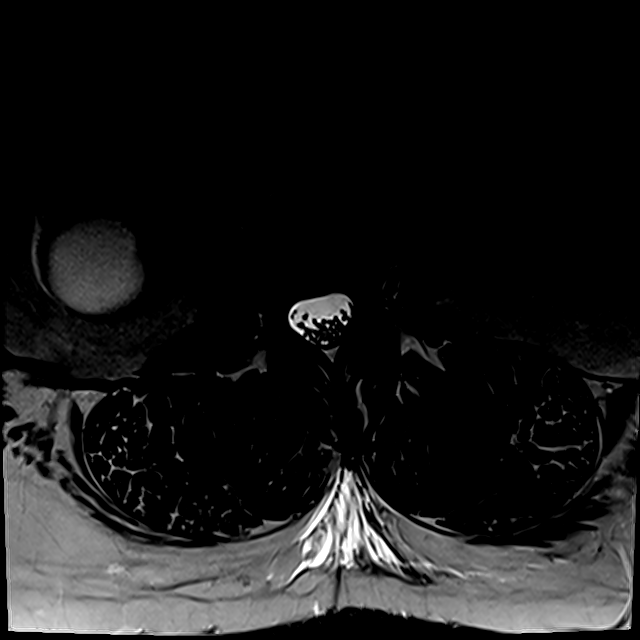
[im 32/32]
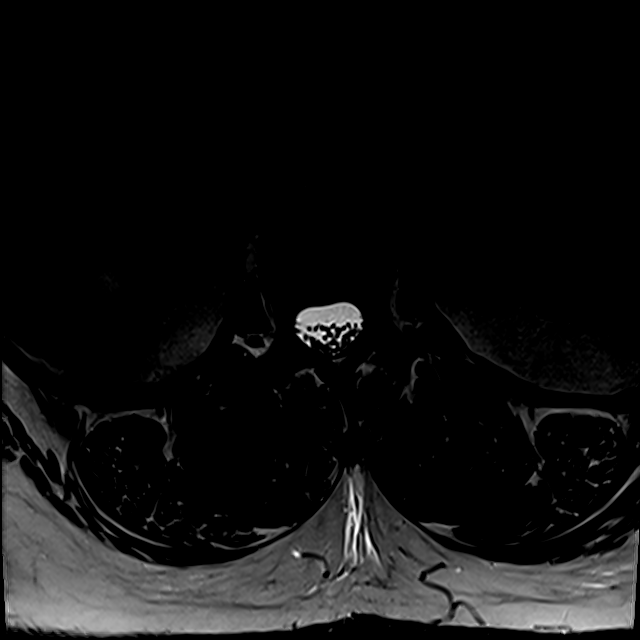

[22 of 48 positions shown; findings below may reference images not displayed]

FINDINGS: Segmentation:  Standard.

Alignment:  Physiologic.

Vertebrae: No worrisome on his lesion. Diffuse low signal intensity
bone marrow could be related to body habitus or chronic disease.

Conus medullaris: Extends to the L1 level and appears normal.

Paraspinal and other soft tissues: Renal cystic disease appears
mildly increased from prior CT. Consider renal ultrasound for
further evaluation.

Disc levels:

L1-L2:  Normal.

L2-L3:  Normal.

L3-L4: Disc desiccation and slight loss of disc height. Annular
bulge. Facet arthropathy. No impingement.

L4-L5: Disc desiccation. Central and leftward protrusion. Facet
arthropathy and ligamentum flavum hypertrophy. Slight subarticular
zone and foraminal zone narrowing could affect the LEFT L4 and/or
LEFT L5 nerve roots.

L5-S1: Unremarkable disc space. Facet arthropathy. No impingement.
IMPRESSION: Central and leftward protrusion at L4-5 is fairly shallow, but in
conjunction with posterior element hypertrophy, contributes to
subarticular zone and foraminal zone narrowing. Correlate clinically
for LEFT L4 and/or LEFT L5 nerve root impingement.

Low signal intensity bone marrow diffusely, favored to be related to
body habitus.

Renal cystic disease, appears progressed from 2262. Consider renal
sonography for further evaluation.

## 2017-08-03 ENCOUNTER — Encounter (INDEPENDENT_AMBULATORY_CARE_PROVIDER_SITE_OTHER): Payer: Self-pay | Admitting: Orthopaedic Surgery

## 2017-08-03 ENCOUNTER — Ambulatory Visit (INDEPENDENT_AMBULATORY_CARE_PROVIDER_SITE_OTHER): Payer: Medicaid Other | Admitting: Orthopaedic Surgery

## 2017-08-03 VITALS — BP 120/83 | HR 79 | Ht 63.0 in | Wt 237.0 lb

## 2017-08-03 DIAGNOSIS — M1711 Unilateral primary osteoarthritis, right knee: Secondary | ICD-10-CM

## 2017-08-03 DIAGNOSIS — M5126 Other intervertebral disc displacement, lumbar region: Secondary | ICD-10-CM | POA: Diagnosis not present

## 2017-08-03 NOTE — Progress Notes (Signed)
Office Visit Note   Patient: Patricia Ramsey           Date of Birth: July 16, 1965           MRN: [id number] Visit Date: 08/03/2017              Requested by: Lucianne Lei, [address] [place], [place] [address] PCP: Lucianne Lei, MD   Assessment & Plan: Visit Diagnoses:  1. Unilateral primary osteoarthritis, right knee   2. Protrusion of lumbar intervertebral disc   3.     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  Eventually she will require either total knee arthroplasty on the right or possibly lumbar procedure for disc protrusion on the left at L4-5.  She needs to have her BMI below 40 and I plan to recheck her in 6 months.  Follow-Up Instructions: Return in about 6 months (around 02/03/2018).   Orders:  No orders of the defined types were placed in this encounter.  No orders of the defined types were placed in this encounter.     Procedures: No procedures performed   Clinical Data: No additional findings.   Subjective: Chief Complaint  Patient presents with  . Right Knee - Pain, Follow-up  . Lower Back - Pain, Follow-up    HPI patient returns ongoing problems with right knee arthritis worse than the left with primarily patellofemoral symptoms.  She had a degenerative posterior meniscus tear that had knee arthroscopy back in 2016.  Postoperatively she developed DVT with pulmonary embolism was in the ICU and has been on Eliquis since that time.  She is try to work on weight loss and current weight is 237.  6 months ago she was 215.  Patient had some increased weight gain with depression associated with the death of her daughter.  She is trying to work on weight loss once again.  Review of Systems reviewed and updated unchanged from 02/02/2017 office visit.   Objective: Vital Signs: BP 120/83   Pulse 79   Ht 5\' 3"  (1.6 m)   Wt 237 lb (107.5 kg)   BMI 41.98 kg/m    Physical Exam  Constitutional: She is oriented to person, place, and time. She appears well-developed.  HENT:  Head: Normocephalic.  Right Ear: External ear normal.  Left Ear: External ear normal.  Eyes: Pupils are equal, round, and reactive to light.  Neck: No tracheal deviation present. No thyromegaly present.  Cardiovascular: Normal rate.  Pulmonary/Chest: Effort normal. She has no wheezes.  Patient has prolonged expiratory phase without audible wheezing.  Abdominal: Soft.  Neurological: She is alert and oriented to person, place, and time.  Skin: Skin is warm and dry.  Psychiatric: She has a normal mood and affect. Her behavior is normal.     Ortho Exam negative straight leg raising 90 degrees negative logroll both hips.  Severe crepitus with knee extension worse on the right than left knee.  Mild varus deformity.  Some sciatic notch tenderness.  Skin over the lumbar spine is normal.  Specialty Comments:  No specialty comments available.  Imaging: No results found.   PMFS History: Patient Active Problem List   Diagnosis Date Noted  . Chronic low back pain without sciatica 07/28/2016  . Cough variant asthma vs UACS 06/24/2015  . Morbid obesity due to excess calories ([place]) 02/23/2015  . Dyspnea 02/22/2015  . Acute pulmonary  embolism ([place]) 10/09/2014  . HTN (hypertension) 10/09/2014  . Sleep apnea in adult 10/09/2014  . Chest pain   . 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 Asthma   . Diabetes ([employer])    boarderline, pt stopped Glucophage  . Hypertension    no meds  . Pulmonary embolism ([facility])   . Sleep apnea     No family history on file.  Past Surgical History:  Procedure Laterality Date  . ANKLE SURGERY    . CHONDROPLASTY Right 09/21/2014   Procedure: CHONDROPLASTY right knee;  Surgeon: [name], MD;  Location: [facility];  Service: Orthopedics;  Laterality: Right;  . KNEE ARTHROSCOPY Right  09/21/2014   Procedure:  right knee arthroscopy;  Surgeon: [name], MD;  Location: [facility];  Service: Orthopedics;  Laterality: Right;  . LITHOTRIPSY    . oophrectomy    . TUBAL LIGATION     Social History   Occupational History  . Occupation: unemployed  Tobacco Use  . Smoking status: Never Smoker  . Smokeless tobacco: Never Used  Substance and Sexual Activity  . Alcohol use: No  . Drug use: No  . Sexual activity: Not on file

## 2017-10-01 ENCOUNTER — Other Ambulatory Visit: Payer: Self-pay | Admitting: Internal Medicine

## 2017-10-01 DIAGNOSIS — R0600 Dyspnea, unspecified: Secondary | ICD-10-CM

## 2018-02-02 ENCOUNTER — Ambulatory Visit (INDEPENDENT_AMBULATORY_CARE_PROVIDER_SITE_OTHER): Payer: Medicaid Other

## 2018-02-02 ENCOUNTER — Ambulatory Visit (INDEPENDENT_AMBULATORY_CARE_PROVIDER_SITE_OTHER): Payer: Medicaid Other | Admitting: Orthopaedic Surgery

## 2018-02-02 ENCOUNTER — Encounter (INDEPENDENT_AMBULATORY_CARE_PROVIDER_SITE_OTHER): Payer: Self-pay | Admitting: Orthopaedic Surgery

## 2018-02-02 VITALS — BP 101/74 | HR 74 | Ht 63.0 in | Wt 227.6 lb

## 2018-02-02 DIAGNOSIS — M1711 Unilateral primary osteoarthritis, right knee: Secondary | ICD-10-CM | POA: Diagnosis not present

## 2018-02-02 NOTE — Progress Notes (Signed)
Office Visit Note   Patient: Patricia Ramsey           Date of Birth: 05/09/1966           MRN: [id number] Visit Date: 02/02/2018              Requested by: Lucianne Lei, [address] [place], [place] [address] PCP: Lucianne Lei, MD   Assessment & Plan: Visit Diagnoses:  1. Unilateral primary osteoarthritis, right knee   2.     History of pulmonary embolism 2016 still on Eliquis  Plan: Patient is having progressive symptoms right knee.  X-rays demonstrate progressive knee osteoarthritis with marginal osteophytes loss of joint space and most severe involvement patellofemoral medial compartment.  She states she like to proceed with total knee arthroplasty.  She will need preoperative medical clearance and will need to be back on her Eliquis after the procedure.  We discussed risks of bleeding potential for having to have her knee aspirated after the surgery if she develops hemarthrosis which prohibits her progress with therapy.  She is lost weight to get her BMI down to 40 as requested and states she like to proceed with total knee arthroplasty.  She does have some lumbar disc protrusion but states her back is doing better and knee gives her more problems than her back.  It bothers her on a daily basis.  She is failed intra-articular injections activity modification is been amatory with a cane.  Procedure discussed with patient risks were discussed questions were elicited and answered.  Patient can be scheduled pending medical clearance.  She also sees Dr. Christinia Gully and states she like to see him as well before the surgery to maximize her pulmonary status.  Follow-Up Instructions: No follow-ups on file.   Orders:  Orders Placed This Encounter  Procedures  . XR KNEE 3 VIEW RIGHT   No orders of the defined types were placed in this encounter.     Procedures: No procedures performed   Clinical Data: No additional findings.   Subjective: Chief Complaint  Patient  presents with  . Lower Back - Pain  . Right Knee - Pain    HPI 52 year old female returns for six-month follow-up for right knee osteoarthritis.  Previous knee arthroscopy showed grade 3 and grade 4 changes in the medial compartment and grade 3 and 4 changes patellofemoral compartment of the right knee with relative sparing of the lateral compartment.  She is had catching of her knee she is amatory with a cane.  She cannot take any anti-inflammatories since she is on Eliquis after previous pulmonary embolism and 2016.  We have asked her to work on weight loss and she is lost more than 10 pounds since last visit.  BMI is now at 40 and she states her knee bothers her despite use of the cane intermittent ice and activity modification.  It inhibits her with ambulation in the community.  The pain wakes her up at night.  Tried to do some exercising but states the knee pain slows her down in her efforts to continue to work on weight loss.  Review of Systems past history of kidney stones, childbirth x2, partial hysterectomy, left ankle ganglion cyst excision.  Right knee arthroscopy 2016 with chondroplasty medial and patellofemoral joint.  Postop PE still on Eliquis.  History of asthma, hypertension, low back pain with disc protrusion L4-5.  Positive for sleep apnea using CPAP.  14 point review of systems otherwise negative  as pertains HPI.   Objective: Vital Signs: BP 101/74   Pulse 74   Ht 5\' 3"  (1.6 m)   Wt 227 lb 9.6 oz (103.2 kg)   BMI 40.32 kg/m   Physical Exam  Constitutional: She is oriented to person, place, and time. She appears well-developed.  HENT:  Head: Normocephalic.  Right Ear: External ear normal.  Left Ear: External ear normal.  Eyes: Pupils are equal, round, and reactive to light.  Neck: No tracheal deviation present. No thyromegaly present.  Cardiovascular: Normal rate.  Pulmonary/Chest: Effort normal.  Abdominal: Soft.  Neurological: She is alert and oriented to person,  place, and time.  Skin: Skin is warm and dry.  Psychiatric: She has a normal mood and affect. Her behavior is normal.    Ortho Exam patient ambulates with a right knee limp she has 2+ knee effusion no pain with hip internal/external rotation with knee extension she has loud audible pop which is painful.  She is unable to get from sitting standing without use of her arms on the chair.  She ambulates with a cane.  Collateral ligaments are stable she reaches full extension flexes to 120 degrees with pains at extremes of range of motion.  ACL PCL exam is normal no pitting edema distal pulses are 2+ negative Homan no swelling of the calf. Specialty Comments:  No specialty comments available.  Imaging: Xr Knee 3 View Right  Result Date: 02/02/2018 Standing AP both knees lateral right knee and bilateral sunrise patellar x-rays reviewed.  This shows right more than left marginal osteophytes with more medial joint line narrowing on the right.  Significant patellofemoral degenerative spurring.  Irregularity of the right medial femoral condyle with greater than 70% loss of joint space.  Comparison to 01/22/2017 standing films show progression. Impression: Right greater than left knee osteoarthritis with joint space loss marginal osteophytes and medial femoral condyle irregularity.    PMFS History: Patient Active Problem List   Diagnosis Date Noted  . Unilateral primary osteoarthritis, right knee 02/02/2018  . Chronic low back pain without sciatica 07/28/2016  . Cough variant asthma vs UACS 06/24/2015  . Morbid obesity due to excess calories ([facility]) 02/23/2015  . Dyspnea 02/22/2015  . Acute pulmonary embolism ([facility]) 10/09/2014  . HTN (hypertension) 10/09/2014  . Sleep apnea in adult 10/09/2014  . Chest pain   . 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 Asthma   . Diabetes ([facility])    boarderline, pt stopped Glucophage  . Hypertension    no meds  . Pulmonary  embolism ([facility])   . Sleep apnea     No family history on file.  Past Surgical History:  Procedure Laterality Date  . ANKLE SURGERY    . CHONDROPLASTY Right 09/21/2014   Procedure: CHONDROPLASTY right knee;  Surgeon: [name], MD;  Location: [facility];  Service: Orthopedics;  Laterality: Right;  . KNEE ARTHROSCOPY Right 09/21/2014   Procedure:  right knee arthroscopy;  Surgeon: [name], MD;  Location: [facility];  Service: Orthopedics;  Laterality: Right;  . LITHOTRIPSY    . oophrectomy    . TUBAL LIGATION     Social History   Occupational History  . Occupation: unemployed  Tobacco Use  . Smoking status: Never Smoker  . Smokeless tobacco: Never Used  Substance and Sexual Activity  . Alcohol use: No  . Drug use: No  . Sexual activity: Not on file

## 2018-03-31 ENCOUNTER — Ambulatory Visit: Payer: Medicaid Other | Admitting: Internal Medicine

## 2018-04-01 ENCOUNTER — Ambulatory Visit: Payer: Medicaid Other | Admitting: Pulmonary Disease

## 2018-04-01 ENCOUNTER — Other Ambulatory Visit: Payer: Medicaid Other

## 2018-04-01 ENCOUNTER — Encounter: Payer: Self-pay | Admitting: Pulmonary Disease

## 2018-04-01 VITALS — BP 126/88 | HR 84 | Ht 63.0 in | Wt 228.0 lb

## 2018-04-01 DIAGNOSIS — R0600 Dyspnea, unspecified: Secondary | ICD-10-CM | POA: Diagnosis not present

## 2018-04-01 DIAGNOSIS — Z86711 Personal history of pulmonary embolism: Secondary | ICD-10-CM | POA: Diagnosis not present

## 2018-04-01 DIAGNOSIS — G4730 Sleep apnea, unspecified: Secondary | ICD-10-CM

## 2018-04-01 DIAGNOSIS — Z23 Encounter for immunization: Secondary | ICD-10-CM | POA: Diagnosis not present

## 2018-04-01 DIAGNOSIS — J45991 Cough variant asthma: Secondary | ICD-10-CM

## 2018-04-01 DIAGNOSIS — E669 Obesity, unspecified: Secondary | ICD-10-CM

## 2018-04-01 DIAGNOSIS — G4733 Obstructive sleep apnea (adult) (pediatric): Secondary | ICD-10-CM

## 2018-04-01 MED ORDER — FAMOTIDINE 20 MG PO TABS: 20.0000 mg | ORAL_TABLET | Freq: Every day | ORAL | 5 refills | Status: DC

## 2018-04-01 MED ORDER — MOMETASONE FUROATE 50 MCG/ACT NA SUSP: 2.0000 | Freq: Every day | NASAL | 5 refills | Status: DC | PRN

## 2018-04-01 MED ORDER — PANTOPRAZOLE SODIUM 40 MG PO TBEC: DELAYED_RELEASE_TABLET | ORAL | 5 refills | Status: DC

## 2018-04-01 NOTE — Progress Notes (Signed)
[facility]  Chief Complaint  Patient presents with  . Follow-up    Pt feels the pressure on the cpap machine needs to be changed. Pt would like flu shot today.    Constitutional:  BP 126/88 (BP Location: Left Arm, Cuff Size: Normal)   Pulse 84   Ht 5\' 3"  (1.6 m)   Wt 228 lb (103.4 kg)   SpO2 100%   BMI 40.39 kg/m   Past Medical History:  DM, HTN., OA  Brief Summary:  Patricia Ramsey is a 52 y.o. female with obstructive sleep apnea, cough variant asthma, reflux, and provoked PE and DVT after Rt knee surgery in 2016  She has some sinus congestion with weather change.  Not having cough, wheeze, sputum, or chest pain.  Denies fever, skin rash, or leg swelling.  Remains on eliquis.  She is questioning whether she can stop this.    Her daughter was murdered a year ago.  Case remains unresolved.  She still gets depressed thinking about this.    She uses CPAP on most nights, except when she is feeling more depressed.  She feels pressure isn't enough, but needs a new mask.  She gets headaches when she sleeps w/o CPAP.  Physical Exam:   Appearance - well kempt   ENMT - clear nasal mucosa, midline nasal  septum, no oral exudates, no LAN, trachea midline  Respiratory - normal chest wall, normal respiratory effort, no accessory muscle use, no wheeze/rales  CV - s1s2 regular rate and rhythm, no murmurs, no peripheral edema, radial pulses symmetric  GI - soft, non tender, no masses  Lymph - no adenopathy noted in neck and axillary areas  MSK - normal gait  Ext - no cyanosis, clubbing, or joint inflammation noted  Skin - no rashes, lesions, or ulcers  Neuro - normal strength, oriented x 3  Psych - normal mood and affect   Assessment/Plan:   Obstructive sleep apnea. - she reports benefit from CPAP - continue CPAP 14 cm H2O - will get new CPAP mask - she will call if she still feels pressure needs to be higher after getting new  mask  Cough variant asthma. - reviewed her medications with her - explained that dulera and symbicort are similar medications and she doesn't need both - will continue dulera, singulair, and prn albuterol  Allergic rhinitis. - renewed nasonex - continue singulair  Provoked DVT and PE after Rt knee surgery in 2016. - she remains on eliquis and is questioning whether she needs blood thinner medication still - discussed risk/benefit process for determining whether to continue anticoagulation - will check D-dimer, and if negative then should be able to stop eliquis  Obesity. - discussed options to assist with weight loss  GERD. - refilled protonix, pepcid  Patient Instructions  Flu shot today  Lab test today  Follow up in 1 year    Chesley Mires, MD [place] Pager: [phone] 04/01/2018, 12:44 PM  Flow Sheet     Pulmonary tests:  CT angio chest 10/09/14 >> PE with RV:LV ratio 2 CT angio chest 03/01/15 >> PE resolved Spirometry 11/19/15 >> FEV1 1.74 (78%), FEV1% 79  Sleep tests:  PSG 11/22/15 >> AHI 42.6, SpO2 low 52% CPAP 03/01/18 to 03/30/18 >> used on 19 of 30 nights with average 6 hrs 38 min.  Average AHI 0.9 with CPAP 14 cm H2O  Cardiac tests:  Doppler legs 10/09/14 >> Rt popliteal/peroneal vein DVT Echo 02/28/15 >>  EF 60 to 65%   Medications:   Allergies as of 04/01/2018   No Known Allergies     Medication List        Accurate as of 04/01/18 12:44 PM. Always use your most recent med list.          albuterol (2.5 MG/3ML) 0.083% nebulizer solution Commonly known as:  PROVENTIL Take 3 mLs (2.5 mg total) by nebulization every 6 (six) hours as needed for wheezing or shortness of breath.   albuterol 108 (90 Base) MCG/ACT inhaler Commonly known as:  PROVENTIL HFA;VENTOLIN HFA 2 puffs every 4 hours as needed only  if your can't catch your breath   ALPRAZolam 0.5 MG tablet Commonly known as:  XANAX Take 0.5 mg by mouth at bedtime.    amLODipine 5 MG tablet Commonly known as:  NORVASC   apixaban 5 MG Tabs tablet Commonly known as:  ELIQUIS Take 1 tablet (5 mg total) by mouth 2 (two) times daily.   famotidine 20 MG tablet Commonly known as:  PEPCID Take 1 tablet (20 mg total) by mouth at bedtime.   FLUoxetine 20 MG capsule Commonly known as:  PROZAC Take 20 mg by mouth daily.   gabapentin 300 MG capsule Commonly known as:  NEURONTIN Take 1 capsule (300 mg total) by mouth 3 (three) times daily.   hydrochlorothiazide 25 MG tablet Commonly known as:  HYDRODIURIL Take 25 mg by mouth daily.   Misc. Devices Misc by Does not apply route at bedtime. CPAP machine   mometasone 50 MCG/ACT nasal spray Commonly known as:  NASONEX Place 2 sprays into the nose daily as needed.   mometasone-formoterol 100-5 MCG/ACT Aero Commonly known as:  DULERA Take 2 puffs first thing in am and then another 2 puffs about 12 hours later.   montelukast 10 MG tablet Commonly known as:  SINGULAIR Take 1 tablet (10 mg total) by mouth at bedtime. Reported on 10/08/2015   pantoprazole 40 MG tablet Commonly known as:  PROTONIX take 1 tablet by mouth once daily 30 TO 60 MINUTES BEFORE THE FIRST MEAL OF THE DAY   rosuvastatin 20 MG tablet Commonly known as:  CRESTOR TK 1 T PO QD   sodium chloride 0.65 % Soln nasal spray Commonly known as:  OCEAN Place 1 spray into both nostrils as needed for congestion.   valACYclovir 1000 MG tablet Commonly known as:  VALTREX       Past Surgical History:  She  has a past surgical history that includes Tubal ligation; Lithotripsy; oophrectomy; Ankle surgery; Knee arthroscopy (Right, 09/21/2014); and Chondroplasty (Right, 09/21/2014).  Family History:  Her family history is not on file.  Social History:  She  reports that she has never smoked. She has never used smokeless tobacco. She reports that she does not drink alcohol or use drugs.

## 2018-04-01 NOTE — Patient Instructions (Signed)
Flu shot today  Lab test today  Follow up in 1 year

## 2018-04-02 LAB — D-DIMER, QUANTITATIVE: D-Dimer, Quant: 0.37 mcg/mL FEU (ref <0.50)

## 2018-04-19 ENCOUNTER — Ambulatory Visit (INDEPENDENT_AMBULATORY_CARE_PROVIDER_SITE_OTHER): Payer: Medicaid Other | Admitting: Orthopaedic Surgery

## 2018-05-09 ENCOUNTER — Encounter: Payer: Self-pay | Admitting: Nurse Practitioner

## 2018-05-09 ENCOUNTER — Ambulatory Visit: Payer: Medicaid Other | Admitting: Nurse Practitioner

## 2018-05-09 VITALS — BP 110/78 | HR 74 | Temp 98.3°F | Ht 63.0 in | Wt 227.8 lb

## 2018-05-09 DIAGNOSIS — J40 Bronchitis, not specified as acute or chronic: Secondary | ICD-10-CM | POA: Diagnosis not present

## 2018-05-09 DIAGNOSIS — M545 Low back pain, unspecified: Secondary | ICD-10-CM

## 2018-05-09 DIAGNOSIS — J019 Acute sinusitis, unspecified: Secondary | ICD-10-CM | POA: Diagnosis not present

## 2018-05-09 MED ORDER — PREDNISONE 10 MG PO TABS: ORAL_TABLET | ORAL | 0 refills | Status: DC

## 2018-05-09 MED ORDER — DOXYCYCLINE HYCLATE 100 MG PO TABS: 100.0000 mg | ORAL_TABLET | Freq: Two times a day (BID) | ORAL | 0 refills | Status: DC

## 2018-05-09 NOTE — Progress Notes (Signed)
@Patient  ID: Patricia Ramsey, female    DOB: 1965-09-01, 52 y.o.   MRN: [id number]  Chief Complaint  Patient presents with  . Cough    with congestion and sore throat    Referring provider: Lucianne Lei, MD  HPI 52 year old female with obstructive sleep apnea, cough variant asthma, reflux, and provoked PE and DVT after right knee surgery in 2016 (on eliquis).  Patient is followed by Dr. Halford Chessman.  Tests: CT angio chest 10/09/14 >> PE with RV:LV ratio 2 CT angio chest 03/01/15 >> PE resolved Spirometry 11/19/15 >> FEV1 1.74 (78%), FEV1% 79  PSG 11/22/15 >> AHI 42.6, SpO2 low 52% CPAP 03/01/18 to 03/30/18 >> used on 19 of 30 nights with average 6 hrs 38 min.  Average AHI 0.9 with CPAP 14 cm H2O  Doppler legs 10/09/14 >> Rt popliteal/peroneal vein DVT Echo 02/28/15 >> EF 60 to 65%   OV 05/09/18 - congestion and sore throat Patient complains today of sore throat, cough, and sinus congestion that started 3 days ago. She states that symptoms have progressively worsened. States that voice is hoarse today. States that she is also having left sided low back pain. She has been experiencing chills. Cough is nonproductive. She denies any chest pain, fever, or edema. She is compliant with Singulair and Dulera.      No Known Allergies  Immunization History  Administered Date(s) Administered  . Influenza,inj,Quad PF,6+ Mos 02/22/2015, 04/07/2016, 05/17/2017, 04/01/2018    Past Medical History:  Diagnosis Date  . Asthma   . Diabetes ([place])    boarderline, pt stopped Glucophage  . Hypertension    no meds  . Pulmonary embolism ([place])   . Sleep apnea     Tobacco History: Social History   Tobacco Use  Smoking Status Never Smoker  Smokeless Tobacco Never Used   Counseling given: Yes   Outpatient Encounter Medications as of 05/09/2018  Medication Sig  . albuterol (PROAIR HFA) 108 (90 Base) MCG/ACT inhaler 2 puffs every 4 hours as needed only  if your can't catch your breath  . albuterol  (PROVENTIL) (2.5 MG/3ML) 0.083% nebulizer solution Take 3 mLs (2.5 mg total) by nebulization every 6 (six) hours as needed for wheezing or shortness of breath.  . ALPRAZolam (XANAX) 0.5 MG tablet Take 0.5 mg by mouth at bedtime.  Marland Kitchen amLODipine (NORVASC) 5 MG tablet   . apixaban (ELIQUIS) 5 MG TABS tablet Take 1 tablet (5 mg total) by mouth 2 (two) times daily.  . famotidine (PEPCID) 20 MG tablet Take 1 tablet (20 mg total) by mouth at bedtime.  Marland Kitchen FLUoxetine (PROZAC) 20 MG capsule Take 20 mg by mouth daily.  Marland Kitchen gabapentin (NEURONTIN) 300 MG capsule Take 1 capsule (300 mg total) by mouth 3 (three) times daily.  . hydrochlorothiazide (HYDRODIURIL) 25 MG tablet Take 25 mg by mouth daily.  . Misc. Devices MISC by Does not apply route at bedtime. CPAP machine  . mometasone (NASONEX) 50 MCG/ACT nasal spray Place 2 sprays into the nose daily as needed.  . mometasone-formoterol (DULERA) 100-5 MCG/ACT AERO Take 2 puffs first thing in am and then another 2 puffs about 12 hours later.  . montelukast (SINGULAIR) 10 MG tablet Take 1 tablet (10 mg total) by mouth at bedtime. Reported on 10/08/2015  . pantoprazole (PROTONIX) 40 MG tablet take 1 tablet by mouth once daily 30 TO 60 MINUTES BEFORE THE FIRST MEAL OF THE DAY  . rosuvastatin (CRESTOR) 20 MG tablet TK 1 T PO QD  .  sodium chloride (OCEAN) 0.65 % SOLN nasal spray Place 1 spray into both nostrils as needed for congestion.  . valACYclovir (VALTREX) 1000 MG tablet   . doxycycline (VIBRA-TABS) 100 MG tablet Take 1 tablet (100 mg total) by mouth 2 (two) times daily.  . predniSONE (DELTASONE) 10 MG tablet Take 4 tabs for 2 days, then 3 tabs for 2 days, then 2 tabs for 2 days, then 1 tab for 2 days, then stop   No facility-administered encounter medications on file as of 05/09/2018.      Review of Systems  Review of Systems  Constitutional: Positive for chills. Negative for fever.  HENT: Positive for congestion, postnasal drip, sinus pressure, sinus pain and  sore throat.   Respiratory: Positive for cough. Negative for shortness of breath.   Cardiovascular: Negative.  Negative for chest pain, palpitations and leg swelling.  Gastrointestinal: Negative.   Allergic/Immunologic: Negative.   Neurological: Negative.   Psychiatric/Behavioral: Negative.        Physical Exam  BP 110/78 (BP Location: Left Arm, Patient Position: Sitting, Cuff Size: Large)   Pulse 74   Temp 98.3 F (36.8 C)   Ht 5\' 3"  (1.6 m)   Wt 227 lb 12.8 oz (103.3 kg)   SpO2 98%   BMI 40.35 kg/m   Wt Readings from Last 5 Encounters:  05/09/18 227 lb 12.8 oz (103.3 kg)  04/01/18 228 lb (103.4 kg)  02/02/18 227 lb 9.6 oz (103.2 kg)  08/03/17 237 lb (107.5 kg)  05/17/17 245 lb 9.6 oz (111.4 kg)     Physical Exam  Constitutional: She is oriented to person, place, and time. She appears well-developed and well-nourished. No distress.  HENT:  Nose: Right sinus exhibits maxillary sinus tenderness and frontal sinus tenderness. Left sinus exhibits maxillary sinus tenderness and frontal sinus tenderness.  Cardiovascular: Normal rate and regular rhythm.  Pulmonary/Chest: Effort normal and breath sounds normal. No respiratory distress. She has no wheezes. She has no rales.  Musculoskeletal: She exhibits no edema.       Lumbar back: She exhibits tenderness, pain and spasm.       Back:  Neurological: She is alert and oriented to person, place, and time.  Psychiatric: She has a normal mood and affect.  Nursing note and vitals reviewed.      Assessment & Plan:   Acute non-recurrent sinusitis Patient Instructions  Will order doxycycline  Will order prednisone taper Will give samples of delsym  General instructions: Hydrate  Drink enough water to keep your pee (urine) clear or pale yellow. Rest  Rest as much as possible.  Sleep with your head raised (elevated).  Make sure to get enough sleep each night. General instructions  Put a warm, moist washcloth on your  face 3-4 times a day or as told by your doctor. This will help with discomfort.  Wash your hands often with soap and water. If there is no soap and water, use hand sanitizer.  Do not smoke. Avoid being around people who are smoking (secondhand smoke). Call the office if:  You have a fever.  Your symptoms get worse.  Your symptoms do not get better within 10 days.   Back Pain, Adult Back pain is very common. The pain often gets better over time. The cause of back pain is usually not dangerous. Most people can learn to manage their back pain on their own. Follow these instructions at home: Watch your back pain for any changes. The following actions may help to  lessen any pain you are feeling:  Stay active. Start with short walks on flat ground if you can. Try to walk farther each day.  Exercise regularly as told by your doctor. Exercise helps your back heal faster. It also helps avoid future injury by keeping your muscles strong and flexible.  Do not sit, drive, or stand in one place for more than 30 minutes.  Do not stay in bed. Resting more than 1-2 days can slow down your recovery.  Be careful when you bend or lift an object. Use good form when lifting: ? Bend at your knees. ? Keep the object close to your body. ? Do not twist.  Sleep on a firm mattress. Lie on your side, and bend your knees. If you lie on your back, put a pillow under your knees.  Take medicines only as told by your doctor.  Put ice on the injured area. ? Put ice in a plastic bag. ? Place a towel between your skin and the bag. ? Leave the ice on for 20 minutes, 2-3 times a day for the first 2-3 days. After that, you can switch between ice and heat packs.  Avoid feeling anxious or stressed. Find good ways to deal with stress, such as exercise.  Maintain a healthy weight. Extra weight puts stress on your back.  Follow up with Dr. Halford Chessman as scheduled or sooner if needed.     Bronchitis Patient  Instructions  Will order doxycycline  Will order prednisone taper Will give samples of delsym  General instructions: Hydrate  Drink enough water to keep your pee (urine) clear or pale yellow. Rest  Rest as much as possible.  Sleep with your head raised (elevated).  Make sure to get enough sleep each night. General instructions  Put a warm, moist washcloth on your face 3-4 times a day or as told by your doctor. This will help with discomfort.  Wash your hands often with soap and water. If there is no soap and water, use hand sanitizer.  Do not smoke. Avoid being around people who are smoking (secondhand smoke). Call the office if:  You have a fever.  Your symptoms get worse.  Your symptoms do not get better within 10 days.   Back Pain, Adult Back pain is very common. The pain often gets better over time. The cause of back pain is usually not dangerous. Most people can learn to manage their back pain on their own. Follow these instructions at home: Watch your back pain for any changes. The following actions may help to lessen any pain you are feeling:  Stay active. Start with short walks on flat ground if you can. Try to walk farther each day.  Exercise regularly as told by your doctor. Exercise helps your back heal faster. It also helps avoid future injury by keeping your muscles strong and flexible.  Do not sit, drive, or stand in one place for more than 30 minutes.  Do not stay in bed. Resting more than 1-2 days can slow down your recovery.  Be careful when you bend or lift an object. Use good form when lifting: ? Bend at your knees. ? Keep the object close to your body. ? Do not twist.  Sleep on a firm mattress. Lie on your side, and bend your knees. If you lie on your back, put a pillow under your knees.  Take medicines only as told by your doctor.  Put ice on the injured area. ? Put  ice in a plastic bag. ? Place a towel between your skin and the  bag. ? Leave the ice on for 20 minutes, 2-3 times a day for the first 2-3 days. After that, you can switch between ice and heat packs.  Avoid feeling anxious or stressed. Find good ways to deal with stress, such as exercise.  Maintain a healthy weight. Extra weight puts stress on your back.  Follow up with Dr. Halford Chessman as scheduled or sooner if needed.     Acute left-sided low back pain without sciatica Pain seems to be related to muscle strain most likely from coughing. If symptoms do not improve please see PCP.   Patient Instructions  Will order doxycycline  Will order prednisone taper Will give samples of delsym  General instructions: Hydrate  Drink enough water to keep your pee (urine) clear or pale yellow. Rest  Rest as much as possible.  Sleep with your head raised (elevated).  Make sure to get enough sleep each night. General instructions  Put a warm, moist washcloth on your face 3-4 times a day or as told by your doctor. This will help with discomfort.  Wash your hands often with soap and water. If there is no soap and water, use hand sanitizer.  Do not smoke. Avoid being around people who are smoking (secondhand smoke). Call the office if:  You have a fever.  Your symptoms get worse.  Your symptoms do not get better within 10 days.   Back Pain, Adult Back pain is very common. The pain often gets better over time. The cause of back pain is usually not dangerous. Most people can learn to manage their back pain on their own. Follow these instructions at home: Watch your back pain for any changes. The following actions may help to lessen any pain you are feeling:  Stay active. Start with short walks on flat ground if you can. Try to walk farther each day.  Exercise regularly as told by your doctor. Exercise helps your back heal faster. It also helps avoid future injury by keeping your muscles strong and flexible.  Do not sit, drive, or stand in one place for  more than 30 minutes.  Do not stay in bed. Resting more than 1-2 days can slow down your recovery.  Be careful when you bend or lift an object. Use good form when lifting: ? Bend at your knees. ? Keep the object close to your body. ? Do not twist.  Sleep on a firm mattress. Lie on your side, and bend your knees. If you lie on your back, put a pillow under your knees.  Take medicines only as told by your doctor.  Put ice on the injured area. ? Put ice in a plastic bag. ? Place a towel between your skin and the bag. ? Leave the ice on for 20 minutes, 2-3 times a day for the first 2-3 days. After that, you can switch between ice and heat packs.  Avoid feeling anxious or stressed. Find good ways to deal with stress, such as exercise.  Maintain a healthy weight. Extra weight puts stress on your back.  Follow up with Dr. Halford Chessman as scheduled or sooner if needed.        Fenton Foy, NP 05/10/2018

## 2018-05-09 NOTE — Patient Instructions (Addendum)
Will order doxycycline  Will order prednisone taper Will give samples of delsym  General instructions: Hydrate  Drink enough water to keep your pee (urine) clear or pale yellow. Rest  Rest as much as possible.  Sleep with your head raised (elevated).  Make sure to get enough sleep each night. General instructions  Put a warm, moist washcloth on your face 3-4 times a day or as told by your doctor. This will help with discomfort.  Wash your hands often with soap and water. If there is no soap and water, use hand sanitizer.  Do not smoke. Avoid being around people who are smoking (secondhand smoke). Call the office if:  You have a fever.  Your symptoms get worse.  Your symptoms do not get better within 10 days.   Back Pain, Adult Back pain is very common. The pain often gets better over time. The cause of back pain is usually not dangerous. Most people can learn to manage their back pain on their own. Follow these instructions at home: Watch your back pain for any changes. The following actions may help to lessen any pain you are feeling:  Stay active. Start with short walks on flat ground if you can. Try to walk farther each day.  Exercise regularly as told by your doctor. Exercise helps your back heal faster. It also helps avoid future injury by keeping your muscles strong and flexible.  Do not sit, drive, or stand in one place for more than 30 minutes.  Do not stay in bed. Resting more than 1-2 days can slow down your recovery.  Be careful when you bend or lift an object. Use good form when lifting: ? Bend at your knees. ? Keep the object close to your body. ? Do not twist.  Sleep on a firm mattress. Lie on your side, and bend your knees. If you lie on your back, put a pillow under your knees.  Take medicines only as told by your doctor.  Put ice on the injured area. ? Put ice in a plastic bag. ? Place a towel between your skin and the bag. ? Leave the ice on  for 20 minutes, 2-3 times a day for the first 2-3 days. After that, you can switch between ice and heat packs.  Avoid feeling anxious or stressed. Find good ways to deal with stress, such as exercise.  Maintain a healthy weight. Extra weight puts stress on your back.  Follow up with Dr. Halford Chessman as scheduled or sooner if needed.

## 2018-05-10 ENCOUNTER — Encounter: Payer: Self-pay | Admitting: Nurse Practitioner

## 2018-05-10 DIAGNOSIS — M545 Low back pain, unspecified: Secondary | ICD-10-CM | POA: Insufficient documentation

## 2018-05-10 DIAGNOSIS — J019 Acute sinusitis, unspecified: Secondary | ICD-10-CM | POA: Insufficient documentation

## 2018-05-10 DIAGNOSIS — J40 Bronchitis, not specified as acute or chronic: Secondary | ICD-10-CM | POA: Insufficient documentation

## 2018-05-10 NOTE — Assessment & Plan Note (Signed)
Patient Instructions  Will order doxycycline  Will order prednisone taper Will give samples of delsym  General instructions: Hydrate  Drink enough water to keep your pee (urine) clear or pale yellow. Rest  Rest as much as possible.  Sleep with your head raised (elevated).  Make sure to get enough sleep each night. General instructions  Put a warm, moist washcloth on your face 3-4 times a day or as told by your doctor. This will help with discomfort.  Wash your hands often with soap and water. If there is no soap and water, use hand sanitizer.  Do not smoke. Avoid being around people who are smoking (secondhand smoke). Call the office if:  You have a fever.  Your symptoms get worse.  Your symptoms do not get better within 10 days.   Back Pain, Adult Back pain is very common. The pain often gets better over time. The cause of back pain is usually not dangerous. Most people can learn to manage their back pain on their own. Follow these instructions at home: Watch your back pain for any changes. The following actions may help to lessen any pain you are feeling:  Stay active. Start with short walks on flat ground if you can. Try to walk farther each day.  Exercise regularly as told by your doctor. Exercise helps your back heal faster. It also helps avoid future injury by keeping your muscles strong and flexible.  Do not sit, drive, or stand in one place for more than 30 minutes.  Do not stay in bed. Resting more than 1-2 days can slow down your recovery.  Be careful when you bend or lift an object. Use good form when lifting: ? Bend at your knees. ? Keep the object close to your body. ? Do not twist.  Sleep on a firm mattress. Lie on your side, and bend your knees. If you lie on your back, put a pillow under your knees.  Take medicines only as told by your doctor.  Put ice on the injured area. ? Put ice in a plastic bag. ? Place a towel between your skin and the  bag. ? Leave the ice on for 20 minutes, 2-3 times a day for the first 2-3 days. After that, you can switch between ice and heat packs.  Avoid feeling anxious or stressed. Find good ways to deal with stress, such as exercise.  Maintain a healthy weight. Extra weight puts stress on your back.  Follow up with Dr. Halford Chessman as scheduled or sooner if needed.

## 2018-05-10 NOTE — Assessment & Plan Note (Signed)
Pain seems to be related to muscle strain most likely from coughing. If symptoms do not improve please see PCP.   Patient Instructions  Will order doxycycline  Will order prednisone taper Will give samples of delsym  General instructions: Hydrate  Drink enough water to keep your pee (urine) clear or pale yellow. Rest  Rest as much as possible.  Sleep with your head raised (elevated).  Make sure to get enough sleep each night. General instructions  Put a warm, moist washcloth on your face 3-4 times a day or as told by your doctor. This will help with discomfort.  Wash your hands often with soap and water. If there is no soap and water, use hand sanitizer.  Do not smoke. Avoid being around people who are smoking (secondhand smoke). Call the office if:  You have a fever.  Your symptoms get worse.  Your symptoms do not get better within 10 days.   Back Pain, Adult Back pain is very common. The pain often gets better over time. The cause of back pain is usually not dangerous. Most people can learn to manage their back pain on their own. Follow these instructions at home: Watch your back pain for any changes. The following actions may help to lessen any pain you are feeling:  Stay active. Start with short walks on flat ground if you can. Try to walk farther each day.  Exercise regularly as told by your doctor. Exercise helps your back heal faster. It also helps avoid future injury by keeping your muscles strong and flexible.  Do not sit, drive, or stand in one place for more than 30 minutes.  Do not stay in bed. Resting more than 1-2 days can slow down your recovery.  Be careful when you bend or lift an object. Use good form when lifting: ? Bend at your knees. ? Keep the object close to your body. ? Do not twist.  Sleep on a firm mattress. Lie on your side, and bend your knees. If you lie on your back, put a pillow under your knees.  Take medicines only as told by  your doctor.  Put ice on the injured area. ? Put ice in a plastic bag. ? Place a towel between your skin and the bag. ? Leave the ice on for 20 minutes, 2-3 times a day for the first 2-3 days. After that, you can switch between ice and heat packs.  Avoid feeling anxious or stressed. Find good ways to deal with stress, such as exercise.  Maintain a healthy weight. Extra weight puts stress on your back.  Follow up with Dr. Halford Chessman as scheduled or sooner if needed.

## 2018-05-10 NOTE — Progress Notes (Signed)
Reviewed and agree with assessment/plan.   Delaynee Alred, MD [facility] 05/27/2016, 12:24 PM Pager:  [phone]  

## 2018-11-02 ENCOUNTER — Other Ambulatory Visit: Payer: Self-pay

## 2018-11-02 ENCOUNTER — Encounter (HOSPITAL_COMMUNITY): Payer: Self-pay

## 2018-11-02 ENCOUNTER — Ambulatory Visit (HOSPITAL_COMMUNITY)
Admission: EM | Admit: 2018-11-02 | Discharge: 2018-11-02 | Disposition: A | Discharge status: Home / Self Care | Payer: Medicaid Other | Attending: Family Medicine | Admitting: Family Medicine

## 2018-11-02 DIAGNOSIS — L0291 Cutaneous abscess, unspecified: Secondary | ICD-10-CM

## 2018-11-02 MED ORDER — AMOXICILLIN-POT CLAVULANATE 875-125 MG PO TABS: 1.0000 | ORAL_TABLET | Freq: Two times a day (BID) | ORAL | 0 refills | Status: AC

## 2018-11-02 MED ORDER — TRAMADOL HCL 50 MG PO TABS: 50.0000 mg | ORAL_TABLET | Freq: Four times a day (QID) | ORAL | 0 refills | Status: DC | PRN

## 2018-11-02 NOTE — Discharge Instructions (Signed)
Begin Augmentin twice daily for the next week Continue warm soaks in bathtub/warm compresses with gentle massage to express further drainage  For mild to moderate pain or when you are driving/working please use Tylenol 500mg  to 1000 mg For severe pain or nighttime may use the tramadol.  Do not drive or work after taking this. Please restart your Eliquis.  Please follow-up if symptoms not improving over the next 3 to 4 days

## 2018-11-02 NOTE — ED Provider Notes (Signed)
[facility]    CSN: [id number] Arrival date & time: 11/02/18  7412     History   Chief Complaint Chief Complaint  Patient presents with   Abscess    HPI Patricia Ramsey is a 53 y.o. female history of asthma, hypertension, previous PE, DM type II, presenting today for evaluation of an abscess.  She notes that she has had swelling to her right labia which is been present for the past year.  She notes that over the past few days she has had pain associated with it.  Last night the area began to drain.  She has been trying warm soaks with Epson salt as well as applying Neosporin without relief.  She notes that she recently stopped taking her Eliquis as it was bleeding.  She had history of similar many years ago.   Denies any new partners.  HPI  Past Medical History:  Diagnosis Date   Asthma    Diabetes ([place])    boarderline, pt stopped Glucophage   Hypertension    no meds   Pulmonary embolism ([facility])    Sleep apnea     Patient Active Problem List   Diagnosis Date Noted   Acute non-recurrent sinusitis 05/10/2018   Bronchitis 05/10/2018   Acute left-sided low back pain without sciatica 05/10/2018   Unilateral primary osteoarthritis, right knee 02/02/2018   Chronic low back pain without sciatica 07/28/2016   Cough variant asthma vs UACS 06/24/2015   Morbid obesity due to excess calories ([place]) 02/23/2015   Dyspnea 02/22/2015   Acute pulmonary embolism ([place]) 10/09/2014   HTN (hypertension) 10/09/2014   Sleep apnea in adult 10/09/2014   Chest pain    Chondromalacia of right knee 09/21/2014    Past Surgical History:  Procedure Laterality Date   ANKLE SURGERY     CHONDROPLASTY Right 09/21/2014   Procedure: CHONDROPLASTY right knee;  Surgeon: [name], MD;  Location: [facility];  Service: Orthopedics;  Laterality: Right;   KNEE ARTHROSCOPY Right 09/21/2014   Procedure:  right knee arthroscopy;  Surgeon: [name], MD;   Location: [facility];  Service: Orthopedics;  Laterality: Right;   LITHOTRIPSY     oophrectomy     TUBAL LIGATION      OB History   No obstetric history on file.      Home Medications    Prior to Admission medications   Medication Sig Start Date End Date Taking? Authorizing Provider  albuterol (PROAIR HFA) 108 (90 Base) MCG/ACT inhaler 2 puffs every 4 hours as needed only  if your can't catch your breath 05/17/17   Tanda Rockers, MD  albuterol (PROVENTIL) (2.5 MG/3ML) 0.083% nebulizer solution Take 3 mLs (2.5 mg total) by nebulization every 6 (six) hours as needed for wheezing or shortness of breath. 06/21/15   Fransico Meadow, PA-C  ALPRAZolam Duanne Moron) 0.5 MG tablet Take 0.5 mg by mouth at bedtime. 12/24/16   [provider]  amLODipine (NORVASC) 5 MG tablet  07/04/17   [provider]  amoxicillin-clavulanate (AUGMENTIN) 875-125 MG tablet Take 1 tablet by mouth every 12 (twelve) hours for 7 days. 11/02/18 11/09/18  Mylinda Brook C, PA-C  apixaban (ELIQUIS) 5 MG TABS tablet Take 1 tablet (5 mg total) by mouth 2 (two) times daily. 06/24/15   Tanda Rockers, MD  famotidine (PEPCID) 20 MG tablet Take 1 tablet (20 mg total) by mouth at bedtime. 04/01/18   Chesley Mires, MD  FLUoxetine (PROZAC)  20 MG capsule Take 20 mg by mouth daily. 01/08/18   [provider]  gabapentin (NEURONTIN) 300 MG capsule Take 1 capsule (300 mg total) by mouth 3 (three) times daily. 10/13/14   Reyne Dumas, MD  hydrochlorothiazide (HYDRODIURIL) 25 MG tablet Take 25 mg by mouth daily. 02/07/15   [provider]  Misc. Devices MISC by Does not apply route at bedtime. CPAP machine    [provider]  mometasone (NASONEX) 50 MCG/ACT nasal spray Place 2 sprays into the nose daily as needed. 04/01/18   Chesley Mires, MD  mometasone-formoterol (DULERA) 100-5 MCG/ACT AERO Take 2 puffs first thing in am and then another 2 puffs about 12 hours later. 10/08/15   Parrett, Fonnie Mu, NP  montelukast (SINGULAIR) 10 MG tablet Take 1 tablet (10 mg total) by mouth at bedtime. Reported on 10/08/2015 10/08/15   Parrett, Fonnie Mu, NP  pantoprazole (PROTONIX) 40 MG tablet take 1 tablet by mouth once daily 30 TO 60 MINUTES BEFORE THE FIRST MEAL OF THE DAY 04/01/18   Chesley Mires, MD  rosuvastatin (CRESTOR) 20 MG tablet TK 1 T PO QD 01/08/18   [provider]  sodium chloride (OCEAN) 0.65 % SOLN nasal spray Place 1 spray into both nostrils as needed for congestion. 10/13/14   Reyne Dumas, MD  traMADol (ULTRAM) 50 MG tablet Take 1 tablet (50 mg total) by mouth every 6 (six) hours as needed for severe pain. 11/02/18   Zubin Pontillo, Elesa Hacker, PA-C  valACYclovir (VALTREX) 1000 MG tablet  07/04/17   [provider]    Family History Family History  Problem Relation Age of Onset   Heart failure Mother    Hypertension Mother     Social History Social History   Tobacco Use   Smoking status: Never Smoker   Smokeless tobacco: Never Used  Substance Use Topics   Alcohol use: No   Drug use: No     Allergies   Patient has no known allergies.   Review of Systems Review of Systems  Constitutional: Negative for fatigue and fever.  HENT: Negative for mouth sores.   Eyes: Negative for visual disturbance.  Respiratory: Negative for shortness of breath.   Cardiovascular: Negative for chest pain.  Gastrointestinal: Negative for abdominal pain, nausea and vomiting.  Genitourinary: Positive for genital sores.  Musculoskeletal: Negative for arthralgias and joint swelling.  Skin: Positive for color change and wound. Negative for rash.  Neurological: Negative for dizziness, weakness, light-headedness and headaches.     Physical Exam Triage Vital Signs ED Triage Vitals  Enc Vitals Group     BP 11/02/18 0947 119/76     Pulse Rate 11/02/18 0947 76     Resp 11/02/18 0947 18     Temp 11/02/18 0947 98.4 F (36.9 C)     Temp Source 11/02/18 0947 Oral     SpO2 11/02/18  0947 100 %     Weight --      Height --      Head Circumference --      Peak Flow --      Pain Score 11/02/18 0946 10     Pain Loc --      Pain Edu? --      Excl. in [place]? --    No data found.  Updated Vital Signs BP 119/76 (BP Location: Right Wrist)    Pulse 76    Temp 98.4 F (36.9 C) (Oral)    Resp 18    SpO2  100%   Visual Acuity Right Eye Distance:   Left Eye Distance:   Bilateral Distance:    Right Eye Near:   Left Eye Near:    Bilateral Near:     Physical Exam Vitals signs and nursing note reviewed.  Constitutional:      Appearance: She is well-developed.     Comments: No acute distress  HENT:     Head: Normocephalic and atraumatic.     Nose: Nose normal.  Eyes:     Conjunctiva/sclera: Conjunctivae normal.  Neck:     Musculoskeletal: Neck supple.  Cardiovascular:     Rate and Rhythm: Normal rate.  Pulmonary:     Effort: Pulmonary effort is normal. No respiratory distress.  Abdominal:     General: There is no distension.  Genitourinary:    Comments: Right upper labia majora with 1 cm area of erythema and small amount of pustular drainage, surrounding induration extending approximately 2 to 3 cm circumferentially.  No significant fluctuance palpated. Musculoskeletal: Normal range of motion.  Skin:    General: Skin is warm and dry.  Neurological:     Mental Status: She is alert and oriented to person, place, and time.      UC Treatments / Results  Labs (all labs ordered are listed, but only abnormal results are displayed) Labs Reviewed - No data to display  EKG None  Radiology No results found.  Procedures Procedures (including critical care time)  Medications Ordered in UC Medications - No data to display  Initial Impression / Assessment and Plan / UC Course  I have reviewed the triage vital signs and the nursing notes.  Pertinent labs & imaging results that were available during my care of the patient were reviewed by me and considered in  my medical decision making (see chart for details).     Patient appears to have labial abscess, previously draining, will defer further I&D at this time, no fluctuance noted.  Will initiate Augmentin and have continue warm compresses.  Advised to restart her Eliquis, and mainly stick to Tylenol for pain, avoid ibuprofen/NSAIDs.  May use tramadol for severe pain.  Discussed drowsiness regarding tramadol.  Follow-up in 3 to 4 days if not having improvement with antibiotic and warm compresses.  Discussed strict return precautions. Patient verbalized understanding and is agreeable with plan.  Final Clinical Impressions(s) / UC Diagnoses   Final diagnoses:  Abscess     Discharge Instructions     Begin Augmentin twice daily for the next week Continue warm soaks in bathtub/warm compresses with gentle massage to express further drainage  For mild to moderate pain or when you are driving/working please use Tylenol 500mg  to 1000 mg For severe pain or nighttime may use the tramadol.  Do not drive or work after taking this. Please restart your Eliquis.  Please follow-up if symptoms not improving over the next 3 to 4 days    ED Prescriptions    Medication Sig Dispense Auth. Provider   amoxicillin-clavulanate (AUGMENTIN) 875-125 MG tablet Take 1 tablet by mouth every 12 (twelve) hours for 7 days. 14 tablet Aislynn Cifelli C, PA-C   traMADol (ULTRAM) 50 MG tablet Take 1 tablet (50 mg total) by mouth every 6 (six) hours as needed for severe pain. 10 tablet Ibrahima Holberg, Al[place] C, PA-C     Controlled Substance Prescriptions [place] Controlled Substance Registry consulted? Yes, I have consulted the [place] Controlled Substances Registry for this patient, and feel the risk/benefit ratio today is favorable for proceeding  with this prescription for a controlled substance.   Joneen Caraway Rivers C, PA-C 11/02/18 1034

## 2018-11-02 NOTE — ED Triage Notes (Signed)
Patient presents to Urgent Care with complaints of abscess that popped on her vagina since last night. Patient reports she thinks she has more next to the one that burst and it is painful for her to walk. Pt states she is on a blood thinner but stopped it for a few days because the abscess was bleeding a lot. Pt educated on importance of medication adherence.

## 2018-11-25 ENCOUNTER — Other Ambulatory Visit: Payer: Self-pay | Admitting: *Deleted

## 2018-11-25 DIAGNOSIS — R0600 Dyspnea, unspecified: Secondary | ICD-10-CM

## 2018-11-25 MED ORDER — FAMOTIDINE 20 MG PO TABS: 20.0000 mg | ORAL_TABLET | Freq: Every day | ORAL | 5 refills | Status: AC

## 2018-11-25 MED ORDER — PANTOPRAZOLE SODIUM 40 MG PO TBEC: DELAYED_RELEASE_TABLET | ORAL | 5 refills | Status: DC

## 2018-12-16 ENCOUNTER — Ambulatory Visit (INDEPENDENT_AMBULATORY_CARE_PROVIDER_SITE_OTHER): Payer: Medicaid Other | Admitting: Orthopaedic Surgery

## 2018-12-16 ENCOUNTER — Ambulatory Visit: Payer: Medicaid Other

## 2018-12-16 ENCOUNTER — Ambulatory Visit (INDEPENDENT_AMBULATORY_CARE_PROVIDER_SITE_OTHER): Payer: Medicaid Other

## 2018-12-16 ENCOUNTER — Encounter: Payer: Self-pay | Admitting: Orthopaedic Surgery

## 2018-12-16 VITALS — Ht 63.0 in | Wt 228.0 lb

## 2018-12-16 DIAGNOSIS — M25562 Pain in left knee: Secondary | ICD-10-CM | POA: Diagnosis not present

## 2018-12-16 DIAGNOSIS — M170 Bilateral primary osteoarthritis of knee: Secondary | ICD-10-CM | POA: Diagnosis not present

## 2018-12-16 DIAGNOSIS — M25561 Pain in right knee: Secondary | ICD-10-CM | POA: Diagnosis not present

## 2018-12-16 MED ORDER — TRAMADOL HCL 50 MG PO TABS: 50.0000 mg | ORAL_TABLET | Freq: Two times a day (BID) | ORAL | 0 refills | Status: DC | PRN

## 2018-12-16 NOTE — Progress Notes (Signed)
Office Visit Note   Patient: Patricia Ramsey           Date of Birth: [birth year]           MRN: [id number] Visit Date: 12/16/2018              Requested by: Lucianne Lei, [address] [place],  [place] [id number] PCP: Lucianne Lei, MD   Assessment & Plan: Visit Diagnoses:  1. Pain in both knees, unspecified chronicity   2. Bilateral primary osteoarthritis of knee     Plan: Patient will work on weight loss.  We will recheck her in a month repeat weight when she returns next month and then we can discuss total knee arthroplasty if she is gotten her BMI below 40.  We discussed weight loss strategy with her and she feels that she can get her weight down easily she was at a BMI of 38 when last seen last year.  Follow-Up Instructions: Return in about 1 month (around 01/16/2019).   Orders:  Orders Placed This Encounter  Procedures   XR KNEE 3 VIEW LEFT   XR KNEE 3 VIEW RIGHT   Meds ordered this encounter  Medications   traMADol (ULTRAM) 50 MG tablet    Sig: Take 1 tablet (50 mg total) by mouth every 12 (twelve) hours as needed.    Dispense:  30 tablet    Refill:  0      Procedures: No procedures performed   Clinical Data: No additional findings.   Subjective: Chief Complaint  Patient presents with   Right Knee - Pain   Left Knee - Pain    HPI patient returns today for discussion of bilateral knee osteoarthritis with more pain in her right knee.  She is on chronic Eliquis for history of DVT with saddle embolism PE after knee arthroscopy of the right knee.  She states her left knee is more painful and she is interested in discussing total knee arthroplasty for her left knee.  Unfortunately with COVID she is gained some weight and now her BMI is greater than 40 and she needs to work on losing a few pounds.  She states with the coronavirus she has been less active.  She is not able to take anti-inflammatories due to the Eliquis.  She is requesting something that  she could take occasionally to help with the pain until she has her left total knee arthroplasty.  Review of Systems 14 point review of system positive for bilateral knee osteoarthritis, sleep apnea, morbid obesity, hypertension, history of acute pulmonary embolism on chronic Eliquis.  History of bronchitis.  No current dyspnea.  Negative for chest pain negative for CVA.  Previous right knee arthroscopy 2014.   Objective: Vital Signs: Ht 5\' 3"  (1.6 m)    Wt 228 lb (103.4 kg)    BMI 40.39 kg/m   Physical Exam Constitutional:      Appearance: She is well-developed.  HENT:     Head: Normocephalic.     Right Ear: External ear normal.     Left Ear: External ear normal.  Eyes:     Pupils: Pupils are equal, round, and reactive to light.  Neck:     Thyroid: No thyromegaly.     Trachea: No tracheal deviation.  Cardiovascular:     Rate and Rhythm: Normal rate.  Pulmonary:     Effort: Pulmonary effort is normal.  Abdominal:     Palpations: Abdomen is soft.  Skin:  General: Skin is warm and dry.  Neurological:     Mental Status: She is alert and oriented to person, place, and time.  Psychiatric:        Behavior: Behavior normal.     Ortho Exam patient has 2+ knee effusion crepitus with knee range of motion.  No varus or valgus deformity.  She is amatory with a bilateral knee limp.  Well-healed arthroscopic portals right knee.  Distal pulses are 2+ negative logroll to the hips.  No knee flexion deformity she flexes both knees past 110 degrees.  Specialty Comments:  No specialty comments available.  Imaging: No results found.   PMFS History: Patient Active Problem List   Diagnosis Date Noted   Bilateral primary osteoarthritis of knee 12/16/2018   Acute non-recurrent sinusitis 05/10/2018   Bronchitis 05/10/2018   Acute left-sided low back pain without sciatica 05/10/2018   Chronic low back pain without sciatica 07/28/2016   Cough variant asthma vs UACS 06/24/2015    Morbid obesity due to excess calories ([facility]) 02/23/2015   Dyspnea 02/22/2015   Acute pulmonary embolism ([facility]) 10/09/2014   HTN (hypertension) 10/09/2014   Sleep apnea in adult 10/09/2014   Chest pain    Past Medical History:  Diagnosis Date   Asthma    Diabetes ([facility])    boarderline, pt stopped Glucophage   Hypertension    no meds   Pulmonary embolism ([facility])    Sleep apnea     Family History  Problem Relation Age of Onset   Heart failure Mother    Hypertension Mother     Past Surgical History:  Procedure Laterality Date   ANKLE SURGERY     CHONDROPLASTY Right 09/21/2014   Procedure: CHONDROPLASTY right knee;  Surgeon: [name], MD;  Location: [facility];  Service: Orthopedics;  Laterality: Right;   KNEE ARTHROSCOPY Right 09/21/2014   Procedure:  right knee arthroscopy;  Surgeon: [name], MD;  Location: [facility];  Service: Orthopedics;  Laterality: Right;   LITHOTRIPSY     oophrectomy     TUBAL LIGATION     Social History   Occupational History   Occupation: unemployed  Tobacco Use   Smoking status: Never Smoker   Smokeless tobacco: Never Used  Substance and Sexual Activity   Alcohol use: No   Drug use: No   Sexual activity: Not on file

## 2019-01-13 ENCOUNTER — Ambulatory Visit: Payer: Medicaid Other | Admitting: Orthopaedic Surgery

## 2019-02-07 ENCOUNTER — Ambulatory Visit: Payer: Medicaid Other | Admitting: Orthopaedic Surgery

## 2019-03-16 ENCOUNTER — Ambulatory Visit (INDEPENDENT_AMBULATORY_CARE_PROVIDER_SITE_OTHER): Payer: Medicaid Other | Admitting: Primary Care

## 2019-03-16 ENCOUNTER — Encounter: Payer: Self-pay | Admitting: Primary Care

## 2019-03-16 DIAGNOSIS — J45991 Cough variant asthma: Secondary | ICD-10-CM

## 2019-03-16 DIAGNOSIS — Z86711 Personal history of pulmonary embolism: Secondary | ICD-10-CM

## 2019-03-16 DIAGNOSIS — G4730 Sleep apnea, unspecified: Secondary | ICD-10-CM

## 2019-03-16 MED ORDER — DOXYCYCLINE HYCLATE 100 MG PO TABS: 100.0000 mg | ORAL_TABLET | Freq: Two times a day (BID) | ORAL | 0 refills | Status: DC

## 2019-03-16 MED ORDER — MOMETASONE FURO-FORMOTEROL FUM 100-5 MCG/ACT IN AERO: INHALATION_SPRAY | RESPIRATORY_TRACT | 5 refills | Status: DC

## 2019-03-16 MED ORDER — MOMETASONE FUROATE 50 MCG/ACT NA SUSP: 2.0000 | Freq: Every day | NASAL | 5 refills | Status: AC | PRN

## 2019-03-16 MED ORDER — PREDNISONE 10 MG PO TABS: ORAL_TABLET | ORAL | 0 refills | Status: DC

## 2019-03-16 MED ORDER — ALBUTEROL SULFATE HFA 108 (90 BASE) MCG/ACT IN AERS: INHALATION_SPRAY | RESPIRATORY_TRACT | 5 refills | Status: DC

## 2019-03-16 MED ORDER — ALBUTEROL SULFATE (2.5 MG/3ML) 0.083% IN NEBU: 2.5000 mg | INHALATION_SOLUTION | Freq: Four times a day (QID) | RESPIRATORY_TRACT | 12 refills | Status: DC | PRN

## 2019-03-16 MED ORDER — MONTELUKAST SODIUM 10 MG PO TABS: 10.0000 mg | ORAL_TABLET | Freq: Every day | ORAL | 5 refills | Status: DC

## 2019-03-16 NOTE — Progress Notes (Signed)
Virtual Visit via Telephone Note  I connected with Patricia Ramsey on 03/16/19 at 11:30 AM EDT by telephone and verified that I am speaking with the correct person using two identifiers.  Location: Patient: Home Provider: Office   I discussed the limitations, risks, security and privacy concerns of performing an evaluation and management service by telephone and the availability of in person appointments. I also discussed with the patient that there may be a patient responsible charge related to this service. The patient expressed understanding and agreed to proceed.   History of Present Illness: 53 year old female, never smoked. PMH significant for sleep apnea, cough variant asthma, hypertension, provoked PE/DVT in 2016, obesity. Patient of Dr. Halford Chessman, last seen by pulmonary NP on 05/09/18 for acute sinusitis. Maintained on Dulera 100, Singulair and prn albuterol. Continues Eliquis 5mg  twice daily for PE.    03/16/2019 Patient contacted today for annual OSA follow-up. She is compliant with CPAP use, reports no issues with pressure setting. Using full face mask. DME company is [employer]. Acute complaints of nasal congestion with yellow mucus, PND and HA x 3 days. Continues Dulera twice daily. Using albuterol rescue inhaler for chest tightness with improvement. Continues Singulair for allergy symptoms. Taking tylenol for headache. No recent sick or COVID contact. Denies fever, sob, wheezing, cp.   Airview download 09/14-10/13/20: 30/30 days used; 100% > 4 hours Average use 8 hours 4 mins Pressure 14 cm H20 Air leaks 13.5L/min AHI 0.7  Observations/Objective:  - No shortness of breath, wheezing or cough noted during phone conversation  Pulmonary tests Spirometry 11/19/15 >> FEV1 1.74 (78%), FEV1% 79 CT angio chest 10/09/14 >> PE with RV:LV ratio 2 CT angio chest 03/01/15 >> PE resolved  Cardiac tests Doppler legs 10/09/14 >> Rt popliteal/peroneal vein DVT Echo 02/28/15 >> EF 60 to  65%  Assessment and Plan:  Sleep apnea: - 100% Compliant with CPAP use  - Pressure 14cm H20; AHI 0.7 - No changes, renew supplies with [employer]  - Continue to wear CPAP every night for 4-6 hours or more - Do not drive if experiencing excessive daytime fatigue or somnolence  - Continue to work on weight loss - FU in 1 year   Cough variant asthma - Continue Dulera 100 two puffs twice daily - Prn albuterol hfa 2 puffs every 6 hours  Acute Sinusitis/URI  - Rx Doxycycline 1 tab twice daily x 7 days; prednisone taper   PE/DVT - Provoked after right knee injury in 2016 - CT angio chest 03/01/15 >> PE resolved - Continues Eliquis 5mg  twice daily - Ddimer was normal in November 2019, likely ok to come off anticoagulation  - Follow-up with PCP for management of Eliquis   Follow Up Instructions:   - 1 year follow-up with Dr. Halford Chessman or sooner if symptoms worsen   I discussed the assessment and treatment plan with the patient. The patient was provided an opportunity to ask questions and all were answered. The patient agreed with the plan and demonstrated an understanding of the instructions.   The patient was advised to call back or seek an in-person evaluation if the symptoms worsen or if the condition fails to improve as anticipated.  I provided 22 minutes of non-face-to-face time during this encounter.   [name], NP

## 2019-03-16 NOTE — Patient Instructions (Addendum)
Pleasure speaking with you today  Orders: - Renew CPAP supplies with [employer] - Refill inhalers sent to pharmacy  Sleep apnea: - No changes - Continue to wear CPAP every night for 4-6 hours or more - Do not drive if experiencing excessive daytime fatigue or somnolence  - Continue to work on weight loss  Cough variant asthma - Continue Dulera two puffs twice daily  - Use albuterol rescue inhaler 2 puffs every 6 hours as needed for shortness of breath  Acute Sinusitis/URI - Rx Doxycycline 1 tab twice daily x 7 days; prednisone taper   PE/DVT - Provoked after right knee injury - CT angio chest 03/01/15 >> PE resolved - Ddimer was normal in 2019, likely okay to come off anticoagulation  - Discuss with PCP   Follow-up: 1 year with Dr. Halford Chessman or sooner if symptoms worsen

## 2019-03-16 NOTE — Addendum Note (Signed)
Addended by: Parke Poisson E on: 03/16/2019 01:18 PM   Modules accepted: Orders

## 2019-03-16 NOTE — Progress Notes (Signed)
Reviewed and agree with assessment/plan.   Saira Kramme, MD [facility] 05/27/2016, 12:24 PM Pager:  [phone]  

## 2019-04-19 ENCOUNTER — Telehealth: Payer: Self-pay | Admitting: Primary Care

## 2019-04-19 NOTE — Telephone Encounter (Signed)
Called and spoke with Patient.  Patient requested flu vaccine Friday, 04/21/19.  Patient scheduled at 0930.  Nothing further at this time.

## 2019-04-21 ENCOUNTER — Ambulatory Visit: Payer: Medicaid Other

## 2019-04-21 ENCOUNTER — Ambulatory Visit (INDEPENDENT_AMBULATORY_CARE_PROVIDER_SITE_OTHER): Payer: Medicaid Other

## 2019-04-21 ENCOUNTER — Other Ambulatory Visit: Payer: Self-pay

## 2019-04-21 DIAGNOSIS — Z23 Encounter for immunization: Secondary | ICD-10-CM | POA: Diagnosis not present

## 2019-05-04 ENCOUNTER — Ambulatory Visit (INDEPENDENT_AMBULATORY_CARE_PROVIDER_SITE_OTHER): Payer: Medicaid Other | Admitting: Pulmonary Disease

## 2019-05-04 ENCOUNTER — Other Ambulatory Visit: Payer: Self-pay

## 2019-05-04 ENCOUNTER — Encounter: Payer: Self-pay | Admitting: Pulmonary Disease

## 2019-05-04 ENCOUNTER — Ambulatory Visit (INDEPENDENT_AMBULATORY_CARE_PROVIDER_SITE_OTHER): Payer: Medicaid Other

## 2019-05-04 VITALS — BP 124/76 | HR 75 | Temp 97.0°F | Ht 63.0 in | Wt 258.0 lb

## 2019-05-04 DIAGNOSIS — R0609 Other forms of dyspnea: Secondary | ICD-10-CM

## 2019-05-04 DIAGNOSIS — J4541 Moderate persistent asthma with (acute) exacerbation: Secondary | ICD-10-CM | POA: Diagnosis not present

## 2019-05-04 DIAGNOSIS — R0600 Dyspnea, unspecified: Secondary | ICD-10-CM

## 2019-05-04 LAB — CBC WITH DIFFERENTIAL/PLATELET
Basophils Absolute: 0.1 10*3/uL (ref 0.0–0.1)
Basophils Relative: 1 % (ref 0.0–3.0)
Eosinophils Absolute: 0.3 10*3/uL (ref 0.0–0.7)
Eosinophils Relative: 4.3 % (ref 0.0–5.0)
HCT: 43.4 % (ref 36.0–46.0)
Hemoglobin: 14 g/dL (ref 12.0–15.0)
Lymphocytes Relative: 34.5 % (ref 12.0–46.0)
Lymphs Abs: 2.4 10*3/uL (ref 0.7–4.0)
MCHC: 32.3 g/dL (ref 30.0–36.0)
MCV: 90.7 fl (ref 78.0–100.0)
Monocytes Absolute: 0.4 10*3/uL (ref 0.1–1.0)
Monocytes Relative: 6.1 % (ref 3.0–12.0)
Neutro Abs: 3.8 10*3/uL (ref 1.4–7.7)
Neutrophils Relative %: 54.1 % (ref 43.0–77.0)
Platelets: 299 10*3/uL (ref 150.0–400.0)
RBC: 4.78 Mil/uL (ref 3.87–5.11)
RDW: 15 % (ref 11.5–15.5)
WBC: 7 10*3/uL (ref 4.0–10.5)

## 2019-05-04 LAB — COMPREHENSIVE METABOLIC PANEL
ALT: 20 U/L (ref 0–35)
AST: 20 U/L (ref 0–37)
Albumin: 4 g/dL (ref 3.5–5.2)
Alkaline Phosphatase: 87 U/L (ref 39–117)
BUN: 8 mg/dL (ref 6–23)
CO2: 29 mEq/L (ref 19–32)
Calcium: 9.4 mg/dL (ref 8.4–10.5)
Chloride: 106 mEq/L (ref 96–112)
Creatinine, Ser: 0.89 mg/dL (ref 0.40–1.20)
GFR: 79.63 mL/min (ref >60.00)
Glucose, Bld: 93 mg/dL (ref 70–99)
Potassium: 3.8 mEq/L (ref 3.5–5.1)
Sodium: 142 mEq/L (ref 135–145)
Total Bilirubin: 0.4 mg/dL (ref 0.2–1.2)
Total Protein: 7.3 g/dL (ref 6.0–8.3)

## 2019-05-04 MED ORDER — AZITHROMYCIN 250 MG PO TABS: ORAL_TABLET | ORAL | 0 refills | Status: AC

## 2019-05-04 MED ORDER — PREDNISONE 10 MG PO TABS: ORAL_TABLET | ORAL | 0 refills | Status: AC

## 2019-05-04 NOTE — Addendum Note (Signed)
Addended by: Suzzanne Cloud E on: 05/04/2019 11:43 AM   Modules accepted: Orders

## 2019-05-04 NOTE — Progress Notes (Signed)
[facility]  Chief Complaint  Patient presents with  . OSA on CPAP    Does not feel breathing has improved since October televisit. Dulera not working as well as it did before.    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 female with obstructive sleep apnea, and cough variant asthma.  She was seen by Derl Barrow in October for acute sinusitis.  Treated with prednisone and doxycycline.  She felt better with therapy then.  Symptoms recurred.  She is getting cough with yellow sputum.  Has intermittent wheeze.  Feels like her throat is closing when she walks.  Also has trouble swallowing food at times, and feels like food gets stuck in her throat.    She is not having fever, sweats, skin rash, joint swelling, or hemoptysis.  Uses CPAP nightly.  No issues with mask fit.  Her water chamber is leaking, and as a result she hasn't used CPAP for past few days.  Physical Exam:   Appearance - well kempt   ENMT - clear nasal mucosa, midline nasal  septum, no oral exudates, no LAN, trachea midline  Respiratory - normal chest wall, normal respiratory effort, no accessory muscle use, no wheeze/rales  CV - s1s2 regular rate and rhythm, no murmurs, no peripheral edema, radial pulses symmetric  GI - soft, non tender, no masses  Lymph - no adenopathy noted in neck and axillary areas  MSK - normal gait  Ext - no cyanosis, clubbing, or joint inflammation noted  Skin - no rashes, lesions, or ulcers  Neuro - normal strength, oriented x 3  Psych - normal mood and affect   Assessment/Plan:   Acute asthmatic bronchitis with hx  of cough variant asthma. - will give additional course of prednisone and zithromax - check CXR, CBC with diff, CMET - will arrange for spirometry and FeNO - continue dulera >> will arrange for spacer device - continue singulair, prn albuterol  Obstructive sleep apnea. - she is compliant with CPAP and reports benefit - continue CPAP 14 cm H2O - will have her DME arrange for new water chamber; advised she should still be able to use w/o water chamber until she gets replacement  Allergic rhinitis. - continue nasonex, singulair  GERD. - continue pepcid, protonix - might need further assessment of her swallowing and esophagus if symptoms don't improve with above measures  Obesity. - discussed options to assist with weight loss   Patient Instructions  Prednisone 10 mg pill > 3 pills daily for 2 days, 2 pills daily for 2 days, 1 pill daily for 2 days Zithromax 250 mg pil > 2 pills on day 1, then 1 pill daily for next 4 days Chest xray and lab test today Will arrange for spacer device to use with dulera Will arrange for spirometry and FeNO breathing tests  Follow up in 4 weeks    Chesley Mires, MD [place] Pager: [phone] 05/04/2019, 11:27 AM  Flow Sheet     Pulmonary tests:  Spirometry 11/19/15 >> FEV1 1.74 (78%),  FEV1% 79  Chest imaging:  CT angio chest 10/09/14 >> PE with RV:LV ratio 2 CT angio chest 03/01/15 >> PE resolved  Sleep tests:  PSG 11/22/15 >> AHI 42.6, SpO2 low 52%  Cardiac tests:  Doppler legs 10/09/14 >> Rt popliteal/peroneal vein DVT Echo 02/28/15 >> EF 60 to 65%  Medications:   Allergies as of 05/04/2019   No Known Allergies     Medication List       Accurate as of May 04, 2019 11:27 AM. If you have any questions, ask your nurse or doctor.        STOP taking these medications   doxycycline 100 MG tablet Commonly known as: VIBRA-TABS Stopped by: Chesley Mires, MD     TAKE these medications   albuterol 108 (90  Base) MCG/ACT inhaler Commonly known as: ProAir HFA 2 puffs every 4 hours as needed only  if your can't catch your breath   albuterol (2.5 MG/3ML) 0.083% nebulizer solution Commonly known as: PROVENTIL Take 3 mLs (2.5 mg total) by nebulization every 6 (six) hours as needed for wheezing or shortness of breath.   ALPRAZolam 0.5 MG tablet Commonly known as: XANAX Take 0.5 mg by mouth at bedtime.   amLODipine 5 MG tablet Commonly known as: NORVASC Take 5 mg by mouth daily.   apixaban 5 MG Tabs tablet Commonly known as: ELIQUIS Take 1 tablet (5 mg total) by mouth 2 (two) times daily.   azithromycin 250 MG tablet Commonly known as: ZITHROMAX Take 2 tablets (500 mg total) by mouth daily for 1 day, THEN 1 tablet (250 mg total) daily for 4 days. Start taking on: May 04, 2019 Started by: Chesley Mires, MD   famotidine 20 MG tablet Commonly known as: PEPCID Take 1 tablet (20 mg total) by mouth at bedtime.   FLUoxetine 20 MG capsule Commonly known as: PROZAC Take 20 mg by mouth daily.   gabapentin 300 MG capsule Commonly known as: NEURONTIN Take 1 capsule (300 mg total) by mouth 3 (three) times daily.   hydrochlorothiazide 25 MG tablet Commonly known as: HYDRODIURIL Take 25 mg by mouth daily.   Misc. Devices Misc by Does not apply route at bedtime. CPAP machine   mometasone 50 MCG/ACT nasal spray Commonly known as: NASONEX Place 2 sprays into the nose daily as needed.   mometasone-formoterol 100-5 MCG/ACT Aero Commonly known as: DULERA Take 2 puffs first thing in am and then another 2 puffs about 12 hours later.   montelukast 10 MG tablet Commonly known as: SINGULAIR Take 1 tablet (10 mg total) by mouth at bedtime. Reported on 10/08/2015   pantoprazole 40 MG tablet Commonly known as: PROTONIX take 1 tablet by mouth once daily 30 TO 60 MINUTES BEFORE THE FIRST MEAL OF THE DAY   predniSONE 10 MG tablet Commonly known as: DELTASONE Take 3 tablets (30 mg total) by mouth  daily with breakfast for 2 days, THEN 2 tablets (20 mg total) daily with breakfast for 2 days, THEN 1 tablet (10 mg total) daily with breakfast for 2 days. Start taking on: May 04, 2019 What changed: See the new instructions. Changed by: Chesley Mires, MD   rosuvastatin 20 MG tablet Commonly known as: CRESTOR TK 1 T PO QD   sodium chloride 0.65 % Soln nasal spray Commonly known as: OCEAN Place 1 spray into both nostrils as needed for congestion.       Past Surgical History:  She  has a past surgical history that includes Tubal ligation;  Lithotripsy; oophrectomy; Ankle surgery; Knee arthroscopy (Right, 09/21/2014); and Chondroplasty (Right, 09/21/2014).  Family History:  Her family history includes Heart failure in her mother; Hypertension in her mother.  Social History:  She  reports that she has never smoked. She has never used smokeless tobacco. She reports that she does not drink alcohol or use drugs.

## 2019-05-04 NOTE — Patient Instructions (Signed)
Prednisone 10 mg pill > 3 pills daily for 2 days, 2 pills daily for 2 days, 1 pill daily for 2 days Zithromax 250 mg pil > 2 pills on day 1, then 1 pill daily for next 4 days Chest xray and lab test today Will arrange for spacer device to use with dulera Will arrange for spirometry and FeNO breathing tests  Follow up in 4 weeks

## 2019-05-05 ENCOUNTER — Telehealth: Payer: Self-pay | Admitting: Pulmonary Disease

## 2019-05-05 NOTE — Telephone Encounter (Signed)
Pt returning call for results an can be reached @ [phone].Patricia Ramsey

## 2019-05-05 NOTE — Telephone Encounter (Signed)
Dg Chest 2 View  Result Date: 05/04/2019 CLINICAL DATA:  Dyspnea EXAM: CHEST - 2 VIEW COMPARISON:  06/21/2015 FINDINGS: The heart size and mediastinal contours are within normal limits. Both lungs are clear. The visualized skeletal structures are unremarkable. IMPRESSION: No active cardiopulmonary disease. Electronically Signed   By: Donavan Foil M.D.   On: 05/04/2019 16:06    CBC    Component Value Date/Time   WBC 7.0 05/04/2019 1143   RBC 4.78 05/04/2019 1143   HGB 14.0 05/04/2019 1143   HCT 43.4 05/04/2019 1143   PLT 299.0 05/04/2019 1143   MCV 90.7 05/04/2019 1143   MCH 28.8 01/22/2017 1414   MCHC 32.3 05/04/2019 1143   RDW 15.0 05/04/2019 1143   LYMPHSABS 2.4 05/04/2019 1143   MONOABS 0.4 05/04/2019 1143   EOSABS 0.3 05/04/2019 1143   BASOSABS 0.1 05/04/2019 1143    CMP Latest Ref Rng & Units 05/04/2019 01/22/2017 06/21/2015  Glucose 70 - 99 mg/dL 93 84 132(H)  BUN 6 - 23 mg/dL 8 8 9   Creatinine 0.40 - 1.20 mg/dL 0.89 0.80 1.05(H)  Sodium 135 - 145 mEq/L 142 140 138  Potassium 3.5 - 5.1 mEq/L 3.8 4.0 3.3(L)  Chloride 96 - 112 mEq/L 106 105 99(L)  CO2 19 - 32 mEq/L 29 29 27   Calcium 8.4 - 10.5 mg/dL 9.4 9.3 9.4  Total Protein 6.0 - 8.3 g/dL 7.3 - 7.1  Total Bilirubin 0.2 - 1.2 mg/dL 0.4 - 0.4  Alkaline Phos 39 - 117 U/L 87 - 73  AST 0 - 37 U/L 20 - 26  ALT 0 - 35 U/L 20 - 20    Please let her know her chest xray and lab tests were normal.

## 2019-05-05 NOTE — Telephone Encounter (Signed)
Called the patient back and advised her of the results. She voiced understanding. Nothing further needed at this time.

## 2019-05-05 NOTE — Telephone Encounter (Signed)
LVMTCB x 1 for patient. 

## 2019-05-18 ENCOUNTER — Telehealth: Payer: Self-pay | Admitting: Pulmonary Disease

## 2019-05-19 ENCOUNTER — Ambulatory Visit (INDEPENDENT_AMBULATORY_CARE_PROVIDER_SITE_OTHER): Payer: Medicaid Other | Admitting: Pulmonary Disease

## 2019-05-19 ENCOUNTER — Encounter: Payer: Self-pay | Admitting: Pulmonary Disease

## 2019-05-19 DIAGNOSIS — Z20828 Contact with and (suspected) exposure to other viral communicable diseases: Secondary | ICD-10-CM

## 2019-05-19 DIAGNOSIS — Z20822 Contact with and (suspected) exposure to covid-19: Secondary | ICD-10-CM

## 2019-05-19 NOTE — Progress Notes (Signed)
[facility]  Chief Complaint  Patient presents with  . Follow-up    Patient has had a cough and headaches since Monday. Cough is sometimes productive but sputum is clear. Patient states she is weak and her tongue is white.    Constitutional:  There were no vitals taken for this visit.  Deferred.  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 female with obstructive sleep apnea, and cough variant asthma.  Virtual Visit via Telephone Note  I connected with Patricia Ramsey on 05/19/19 at 10:30 AM EST by telephone and verified that I am speaking with the correct person using two identifiers.  Location: Patient: home Provider: medical office   I discussed the limitations, risks, security and privacy concerns of performing an evaluation and management service by telephone and the availability of in person appointments. I also discussed with the patient that there may be a patient responsible charge related to this service. The patient expressed understanding and agreed to proceed.  She was seen on 05/04/19.  Labs and CXR from then were normal.  She was treated with zpak and prednisone for exacerbation of asthma.  Felt better initially, but then started feeling bad again over the past few days.  She is having cough, feeling feverish, feeling fatigued, and has trouble tasting things.  She feels her symptoms are settling into her chest.  She was told that there might be a COVID outbreak at her church, and was advised to get tested.  She hasn't been able to get this set up yet.   Physical Exam:  Deferred.   Assessment/Plan:   Cough, subjective fever, myalgias, and loss of taste. - potential COVID 19 exposure from her church - will arrange for her to get COVID -19 testing - if symptoms progress she would need to go to the ER to assess for  hospital admission  Cough variant asthma. - continue dulera with spacer, and singulair - prn albuterol - will need spirometry and FeNO at some point  Obstructive sleep apnea. - continue CPAP 14 cm H2O  Allergic rhinitis. - continue nasonex, singulair  GERD. - continue pepcid, protonix - might need further assessment of her swallowing and esophagus if symptoms don't improve with above measures  Obesity. - discussed options to assist with weight loss   Patient Instructions  Will arrange for COVID 19 testing and call with results  If your symptoms get worse, then go to the emergency room for further assessment  Follow up in 3 months    I discussed the assessment and treatment plan with the patient. The patient was provided an opportunity to ask questions and all were answered. The patient agreed with the plan and demonstrated an understanding of the instructions.   The patient was advised to call back or seek an in-person evaluation if the symptoms worsen or if the condition fails to improve as anticipated.  I provided 23 minutes of non-face-to-face time during this encounter.   Chesley Mires, MD [facility] Pager: [phone] 05/19/2019, 10:54 AM  Flow Sheet     Pulmonary tests:  Spirometry 11/19/15 >> FEV1 1.74 (78%), FEV1% 79  Chest imaging:  CT angio chest 10/09/14 >> PE with RV:LV ratio 2 CT angio chest 03/01/15 >> PE resolved  Sleep tests:  PSG 11/22/15 >> AHI 42.6, SpO2 low 52%  Cardiac tests:  Doppler  legs 10/09/14 >> Rt popliteal/peroneal vein DVT Echo 02/28/15 >> EF 60 to 65%  Medications:   Allergies as of 05/19/2019   No Known Allergies     Medication List       Accurate as of May 19, 2019 10:54 AM. If you have any questions, ask your nurse or doctor.        albuterol 108 (90 Base) MCG/ACT inhaler Commonly known as: ProAir HFA 2 puffs every 4 hours as needed only  if your can't catch your breath   albuterol (2.5  MG/3ML) 0.083% nebulizer solution Commonly known as: PROVENTIL Take 3 mLs (2.5 mg total) by nebulization every 6 (six) hours as needed for wheezing or shortness of breath.   ALPRAZolam 0.5 MG tablet Commonly known as: XANAX Take 0.5 mg by mouth at bedtime.   amLODipine 5 MG tablet Commonly known as: NORVASC Take 5 mg by mouth daily.   apixaban 5 MG Tabs tablet Commonly known as: ELIQUIS Take 1 tablet (5 mg total) by mouth 2 (two) times daily.   famotidine 20 MG tablet Commonly known as: PEPCID Take 1 tablet (20 mg total) by mouth at bedtime.   FLUoxetine 20 MG capsule Commonly known as: PROZAC Take 20 mg by mouth daily.   gabapentin 300 MG capsule Commonly known as: NEURONTIN Take 1 capsule (300 mg total) by mouth 3 (three) times daily.   hydrochlorothiazide 25 MG tablet Commonly known as: HYDRODIURIL Take 25 mg by mouth daily.   Misc. Devices Misc by Does not apply route at bedtime. CPAP machine   mometasone 50 MCG/ACT nasal spray Commonly known as: NASONEX Place 2 sprays into the nose daily as needed.   mometasone-formoterol 100-5 MCG/ACT Aero Commonly known as: DULERA Take 2 puffs first thing in am and then another 2 puffs about 12 hours later.   montelukast 10 MG tablet Commonly known as: SINGULAIR Take 1 tablet (10 mg total) by mouth at bedtime. Reported on 10/08/2015   pantoprazole 40 MG tablet Commonly known as: PROTONIX take 1 tablet by mouth once daily 30 TO 60 MINUTES BEFORE THE FIRST MEAL OF THE DAY   rosuvastatin 20 MG tablet Commonly known as: CRESTOR TK 1 T PO QD   sodium chloride 0.65 % Soln nasal spray Commonly known as: OCEAN Place 1 spray into both nostrils as needed for congestion.       Past Surgical History:  She  has a past surgical history that includes Tubal ligation; Lithotripsy; oophrectomy; Ankle surgery; Knee arthroscopy (Right, 09/21/2014); and Chondroplasty (Right, 09/21/2014).  Family History:  Her family history includes  Heart failure in her mother; Hypertension in her mother.  Social History:  She  reports that she has never smoked. She has never used smokeless tobacco. She reports that she does not drink alcohol or use drugs.

## 2019-05-19 NOTE — Addendum Note (Signed)
Addended by: Parke Poisson E on: 05/19/2019 11:57 AM   Modules accepted: Orders

## 2019-05-19 NOTE — Patient Instructions (Signed)
Will arrange for COVID 19 testing and call with results  If your symptoms get worse, then go to the emergency room for further assessment  Follow up in 3 months

## 2019-05-22 ENCOUNTER — Emergency Department (HOSPITAL_COMMUNITY)
Admission: EM | Admit: 2019-05-22 | Discharge: 2019-05-22 | Disposition: A | Discharge status: Home / Self Care | Payer: Medicaid Other | Attending: Emergency Medicine | Admitting: Emergency Medicine

## 2019-05-22 ENCOUNTER — Other Ambulatory Visit: Payer: Self-pay

## 2019-05-22 ENCOUNTER — Encounter (HOSPITAL_COMMUNITY): Payer: Self-pay | Admitting: Emergency Medicine

## 2019-05-22 ENCOUNTER — Emergency Department (HOSPITAL_COMMUNITY): Payer: Medicaid Other

## 2019-05-22 DIAGNOSIS — E119 Type 2 diabetes mellitus without complications: Secondary | ICD-10-CM | POA: Insufficient documentation

## 2019-05-22 DIAGNOSIS — Z79899 Other long term (current) drug therapy: Secondary | ICD-10-CM | POA: Diagnosis not present

## 2019-05-22 DIAGNOSIS — R05 Cough: Secondary | ICD-10-CM | POA: Diagnosis present

## 2019-05-22 DIAGNOSIS — Z7901 Long term (current) use of anticoagulants: Secondary | ICD-10-CM | POA: Insufficient documentation

## 2019-05-22 DIAGNOSIS — R519 Headache, unspecified: Secondary | ICD-10-CM | POA: Insufficient documentation

## 2019-05-22 DIAGNOSIS — J45909 Unspecified asthma, uncomplicated: Secondary | ICD-10-CM | POA: Insufficient documentation

## 2019-05-22 DIAGNOSIS — I10 Essential (primary) hypertension: Secondary | ICD-10-CM | POA: Diagnosis not present

## 2019-05-22 DIAGNOSIS — Z20822 Contact with and (suspected) exposure to covid-19: Secondary | ICD-10-CM

## 2019-05-22 DIAGNOSIS — Z20828 Contact with and (suspected) exposure to other viral communicable diseases: Secondary | ICD-10-CM | POA: Insufficient documentation

## 2019-05-22 DIAGNOSIS — R509 Fever, unspecified: Secondary | ICD-10-CM | POA: Insufficient documentation

## 2019-05-22 DIAGNOSIS — J189 Pneumonia, unspecified organism: Secondary | ICD-10-CM | POA: Diagnosis not present

## 2019-05-22 LAB — CBC WITH DIFFERENTIAL/PLATELET
Abs Immature Granulocytes: 0.03 10*3/uL (ref 0.00–0.07)
Basophils Absolute: 0 10*3/uL (ref 0.0–0.1)
Basophils Relative: 0 %
Eosinophils Absolute: 0 10*3/uL (ref 0.0–0.5)
Eosinophils Relative: 0 %
HCT: 42.7 % (ref 36.0–46.0)
Hemoglobin: 14 g/dL (ref 12.0–15.0)
Immature Granulocytes: 0 %
Lymphocytes Relative: 11 %
Lymphs Abs: 0.8 10*3/uL (ref 0.7–4.0)
MCH: 29.1 pg (ref 26.0–34.0)
MCHC: 32.8 g/dL (ref 30.0–36.0)
MCV: 88.8 fL (ref 80.0–100.0)
Monocytes Absolute: 0.3 10*3/uL (ref 0.1–1.0)
Monocytes Relative: 3 %
Neutro Abs: 6.7 10*3/uL (ref 1.7–7.7)
Neutrophils Relative %: 86 %
Platelets: 290 10*3/uL (ref 150–400)
RBC: 4.81 MIL/uL (ref 3.87–5.11)
RDW: 14.6 % (ref 11.5–15.5)
WBC: 7.8 10*3/uL (ref 4.0–10.5)
nRBC: 0 % (ref 0.0–0.2)

## 2019-05-22 LAB — POC SARS CORONAVIRUS 2 AG -  ED: SARS Coronavirus 2 Ag: NEGATIVE

## 2019-05-22 LAB — COMPREHENSIVE METABOLIC PANEL
ALT: 20 U/L (ref 0–44)
AST: 31 U/L (ref 15–41)
Albumin: 3.2 g/dL — ABNORMAL LOW (ref 3.5–5.0)
Alkaline Phosphatase: 78 U/L (ref 38–126)
Anion gap: 12 (ref 5–15)
BUN: 8 mg/dL (ref 6–20)
CO2: 26 mmol/L (ref 22–32)
Calcium: 9 mg/dL (ref 8.9–10.3)
Chloride: 98 mmol/L (ref 98–111)
Creatinine, Ser: 0.92 mg/dL (ref 0.44–1.00)
GFR calc Af Amer: >60 mL/min (ref >60)
GFR calc non Af Amer: >60 mL/min (ref >60)
Glucose, Bld: 107 mg/dL — ABNORMAL HIGH (ref 70–99)
Potassium: 3.4 mmol/L — ABNORMAL LOW (ref 3.5–5.1)
Sodium: 136 mmol/L (ref 135–145)
Total Bilirubin: 0.5 mg/dL (ref 0.3–1.2)
Total Protein: 7.4 g/dL (ref 6.5–8.1)

## 2019-05-22 MED ORDER — BENZONATATE 100 MG PO CAPS: 100.0000 mg | ORAL_CAPSULE | Freq: Three times a day (TID) | ORAL | 0 refills | Status: DC

## 2019-05-22 MED ORDER — DOXYCYCLINE HYCLATE 100 MG PO TABS: 100.0000 mg | ORAL_TABLET | Freq: Two times a day (BID) | ORAL | 0 refills | Status: DC

## 2019-05-22 MED ORDER — ACETAMINOPHEN 325 MG PO TABS
650.0000 mg | ORAL_TABLET | Freq: Once | ORAL | Status: AC | PRN
  Administered 2019-05-22: 650 mg via ORAL
  Filled 2019-05-22: qty 2

## 2019-05-22 MED ORDER — ONDANSETRON 8 MG PO TBDP: 8.0000 mg | ORAL_TABLET | Freq: Three times a day (TID) | ORAL | 0 refills | Status: DC | PRN

## 2019-05-22 NOTE — Discharge Instructions (Addendum)
The Covid test today was negative but this does have a high false-negative rate.  The test you had done yesterday is more sensitive so remain quarantined.  Take the medications as prescribed.  Return to the ED for increasing difficulty with your breathing, shortness of breath.  Continue to take Tylenol ibuprofen as needed for fever.

## 2019-05-22 NOTE — ED Triage Notes (Signed)
Patient c/o fever, headaches, chills, decreased appetite, dry mouth, productive cough x 1 week. States she has been around friends from church that have tested positive for covid. Took test yesterday at [facility] and still waiting for results.

## 2019-05-22 NOTE — ED Notes (Signed)
Discharge instructions and prescriptions discussed with Pt. Pt verbalized understanding. Pt stable and ambulatory.   

## 2019-05-22 NOTE — ED Notes (Signed)
Pt given water 

## 2019-05-22 NOTE — ED Provider Notes (Signed)
[facility] EMERGENCY DEPARTMENT Provider Note   CSN: [id number] Arrival date & time: 05/22/19  1519     History Chief Complaint  Patient presents with  . Cough  . Headache  . Fever    Patricia Ramsey is a 53 y.o. female.  HPI   Pt was exposed to people recently for Covid 19.  Pt was in close contact at church.  She has been having uri sx for the last week.  Pt states the last two nights she has been feverish, sweating at night.    SHe is feeling short of breath.  SHe has sleep apnea and uses a cpap machine.  SHe is on eliquis because of a PE in the past.     Pt was tested yesterday for COVID,  Past Medical History:  Diagnosis Date  . Asthma   . Diabetes ([place])    boarderline, pt stopped Glucophage  . Hypertension    no meds  . Pulmonary embolism ([place])   . Sleep apnea     Patient Active Problem List   Diagnosis Date Noted  . Bilateral primary osteoarthritis of knee 12/16/2018  . Acute non-recurrent sinusitis 05/10/2018  . Bronchitis 05/10/2018  . Acute left-sided low back pain without sciatica 05/10/2018  . Chronic low back pain without sciatica 07/28/2016  . Cough variant asthma vs UACS 06/24/2015  . Morbid obesity due to excess calories ([place]) 02/23/2015  . Dyspnea 02/22/2015  . Acute pulmonary embolism ([place]) 10/09/2014  . HTN (hypertension) 10/09/2014  . Sleep apnea in adult 10/09/2014  . Chest pain     Past Surgical History:  Procedure Laterality Date  . ANKLE SURGERY    . CHONDROPLASTY Right 09/21/2014   Procedure: CHONDROPLASTY right knee;  Surgeon: [name], MD;  Location: [facility];  Service: Orthopedics;  Laterality: Right;  . KNEE ARTHROSCOPY Right 09/21/2014   Procedure:  right knee arthroscopy;  Surgeon: [name], MD;  Location: [facility];  Service: Orthopedics;  Laterality: Right;  . LITHOTRIPSY    . oophrectomy    . TUBAL LIGATION       OB History   No obstetric history on file.      Family History  Problem Relation Age of Onset  . Heart failure Mother   . Hypertension Mother     Social History   Tobacco Use  . Smoking status: Never Smoker  . Smokeless tobacco: Never Used  Substance Use Topics  . Alcohol use: No  . Drug use: No    Home Medications Prior to Admission medications   Medication Sig Start Date End Date Taking? Authorizing Provider  albuterol (PROAIR HFA) 108 (90 Base) MCG/ACT inhaler 2 puffs every 4 hours as needed only  if your can't catch your breath 03/16/19   [name], NP  albuterol (PROVENTIL) (2.5 MG/3ML) 0.083% nebulizer solution Take 3 mLs (2.5 mg total) by nebulization every 6 (six) hours as needed for wheezing or shortness of breath. 03/16/19   [name], NP  ALPRAZolam Duanne Moron) 0.5 MG tablet Take 0.5 mg by mouth at bedtime. 12/24/16   [provider]  amLODipine (NORVASC) 5 MG tablet Take 5 mg by mouth daily.  07/04/17   [provider]  apixaban (ELIQUIS) 5 MG TABS tablet Take 1 tablet (5 mg total) by mouth 2 (two) times daily. 06/24/15   Tanda Rockers, MD  benzonatate (TESSALON) 100 MG capsule Take 1 capsule (100 mg total) by  mouth every 8 (eight) hours. 05/22/19   Dorie Rank, MD  doxycycline (VIBRA-TABS) 100 MG tablet Take 1 tablet (100 mg total) by mouth 2 (two) times daily. 05/22/19   Dorie Rank, MD  famotidine (PEPCID) 20 MG tablet Take 1 tablet (20 mg total) by mouth at bedtime. 11/25/18   Chesley Mires, MD  FLUoxetine (PROZAC) 20 MG capsule Take 20 mg by mouth daily. 01/08/18   [provider]  gabapentin (NEURONTIN) 300 MG capsule Take 1 capsule (300 mg total) by mouth 3 (three) times daily. 10/13/14   Reyne Dumas, MD  hydrochlorothiazide (HYDRODIURIL) 25 MG tablet Take 25 mg by mouth daily. 02/07/15   [provider]  Misc. Devices MISC by Does not apply route at bedtime. CPAP machine    [provider]  mometasone (NASONEX) 50 MCG/ACT nasal spray Place 2 sprays into the  nose daily as needed. 03/16/19   [name], NP  mometasone-formoterol [facility]) 100-5 MCG/ACT AERO Take 2 puffs first thing in am and then another 2 puffs about 12 hours later. 03/16/19   [name], NP  montelukast (SINGULAIR) 10 MG tablet Take 1 tablet (10 mg total) by mouth at bedtime. Reported on 10/08/2015 03/16/19   [name], NP  ondansetron (ZOFRAN ODT) 8 MG disintegrating tablet Take 1 tablet (8 mg total) by mouth every 8 (eight) hours as needed for nausea or vomiting. 05/22/19   Dorie Rank, MD  pantoprazole (PROTONIX) 40 MG tablet take 1 tablet by mouth once daily 30 TO 60 MINUTES BEFORE THE FIRST MEAL OF THE DAY 11/25/18   Chesley Mires, MD  rosuvastatin (CRESTOR) 20 MG tablet TK 1 T PO QD 01/08/18   [provider]  sodium chloride (OCEAN) 0.65 % SOLN nasal spray Place 1 spray into both nostrils as needed for congestion. 10/13/14   Reyne Dumas, MD    Allergies    Patient has no known allergies.  Review of Systems   Review of Systems  All other systems reviewed and are negative.   Physical Exam Updated Vital Signs BP (!) 126/91   Pulse 93   Temp (!) 103 F (39.4 C) (Oral)   Resp (!) 33   SpO2 95%   Physical Exam Vitals and nursing note reviewed.  Constitutional:      General: She is not in acute distress.    Appearance: She is well-developed.  HENT:     Head: Normocephalic and atraumatic.     Right Ear: External ear normal.     Left Ear: External ear normal.  Eyes:     General: No scleral icterus.       Right eye: No discharge.        Left eye: No discharge.     Conjunctiva/sclera: Conjunctivae normal.  Neck:     Trachea: No tracheal deviation.  Cardiovascular:     Rate and Rhythm: Normal rate and regular rhythm.  Pulmonary:     Effort: Pulmonary effort is normal. No respiratory distress.     Breath sounds: Normal breath sounds. No stridor. No wheezing or rales.  Abdominal:     General: Bowel sounds are normal. There is no  distension.     Palpations: Abdomen is soft.     Tenderness: There is no abdominal tenderness. There is no guarding or rebound.  Musculoskeletal:        General: No tenderness.     Cervical back: Neck supple.  Skin:    General: Skin is warm and dry.  Findings: No rash.  Neurological:     Mental Status: She is alert.     Cranial Nerves: No cranial nerve deficit (no facial droop, extraocular movements intact, no slurred speech).     Sensory: No sensory deficit.     Motor: No abnormal muscle tone or seizure activity.     Coordination: Coordination normal.     ED Results / Procedures / Treatments   Labs (all labs ordered are listed, but only abnormal results are displayed) Labs Reviewed  COMPREHENSIVE METABOLIC PANEL - Abnormal; Notable for the following components:      Result Value   Potassium 3.4 (*)    Glucose, Bld 107 (*)    Albumin 3.2 (*)    All other components within normal limits  CBC WITH DIFFERENTIAL/PLATELET  POC SARS CORONAVIRUS 2 AG -  ED    EKG EKG Interpretation  Date/Time:  Monday May 22 2019 16:02:40 EST Ventricular Rate:  104 PR Interval:  130 QRS Duration: 74 QT Interval:  328 QTC Calculation: 431 R Axis:   43 Text Interpretation: Sinus tachycardia Otherwise normal ECG Since last tracing rate faster Confirmed by Dorie Rank [phone]) on 05/22/2019 4:26:36 PM   Radiology DG Chest Portable 1 View  Result Date: 05/22/2019 CLINICAL DATA:  Patient c/o fever, headaches, chills, decreased appetite, dry mouth, productive cough x 1 week. States she has been around friends from church that have tested positive for covid. EXAM: PORTABLE CHEST 1 VIEW COMPARISON:  05/04/2019 FINDINGS: New perihilar and basilar airspace opacities are present and suspicious for multilobar pneumonia. Mild enlargement of the cardiopericardial silhouette observed. The patient is rotated to the right on today's radiograph, reducing diagnostic sensitivity and specificity.  IMPRESSION: 1. New bilateral perihilar and basilar airspace opacities compatible with multilobar pneumonia. 2. Mild enlargement of the cardiopericardial silhouette. Electronically Signed   By: Van Clines M.D.   On: 05/22/2019 16:54    Procedures Procedures (including critical care time)  Medications Ordered in ED Medications  acetaminophen (TYLENOL) tablet 650 mg (650 mg Oral Given 05/22/19 1611)    ED Course  I have reviewed the triage vital signs and the nursing notes.  Pertinent labs & imaging results that were available during my care of the patient were reviewed by me and considered in my medical decision making (see chart for details).  Clinical Course as of May 22 1831  Mon May 22, 2019  1806 Labs reviewed.  No significant abnormalities.  CXR does show multifocal pna, would be consistent with covid.  Rapid antigen test is negative but has another test pending from yesterday.   [JK]  1829 Patient's respiratory rate appears to be in no 20s at the bedside.  Oxygen saturation is 99% on room air during my conversation with patient   [JK]    Clinical Course User Index [JK] Dorie Rank, MD   MDM Rules/Calculators/A&P                     Curb 65 score equals 0. Patient's x-ray does suggest pneumonia.  Laboratory tests are reassuring.  Symptoms are certainly concerning for Covid pneumonia although her rapid test is negative.  I confirm with the patient she did have a confirmatory test sent yesterday.  No indication for repeat testing in the ED then at this point.  Patient does not have an oxygen requirement.  She is not vomiting.  She is certainly ill but I do not feel that hospitalization is indicated at this time.  She  is not a candidate for Decadron.  Warning signs and precautions discussed.  Infection may be bacterial and not  Covid therefore I prescribed doxycycline as well as Tessalon and Zofran for symptomatic care.  Amilliana Pasche was evaluated in Emergency Department  on 05/22/2019 for the symptoms described in the history of present illness. She was evaluated in the context of the global COVID-19 pandemic, which necessitated consideration that the patient might be at risk for infection with the SARS-CoV-2 virus that causes COVID-19. Institutional protocols and algorithms that pertain to the evaluation of patients at risk for COVID-19 are in a state of rapid change based on information released by regulatory bodies including the CDC and federal and state organizations. These policies and algorithms were followed during the patient's care in the ED.  Final Clinical Impression(s) / ED Diagnoses Final diagnoses:  Community acquired pneumonia, unspecified laterality  Person under investigation for COVID-19    Rx / DC Orders ED Discharge Orders         Ordered    benzonatate (TESSALON) 100 MG capsule  Every 8 hours     05/22/19 1828    ondansetron (ZOFRAN ODT) 8 MG disintegrating tablet  Every 8 hours PRN     05/22/19 1828    doxycycline (VIBRA-TABS) 100 MG tablet  2 times daily     05/22/19 1828           Dorie Rank, MD 05/22/19 [phone]

## 2019-05-23 ENCOUNTER — Other Ambulatory Visit: Payer: Medicaid Other

## 2019-05-23 NOTE — Telephone Encounter (Signed)
Seems like encounter was open in error so closing encounter.  

## 2019-05-24 NOTE — Progress Notes (Signed)
Called the patient and she has been scheduled for televisit on 05/30/19 at 11:30 with Derl Barrow, NP. Nothing further needed at this time.

## 2019-05-30 ENCOUNTER — Ambulatory Visit (INDEPENDENT_AMBULATORY_CARE_PROVIDER_SITE_OTHER): Payer: Medicaid Other | Admitting: Primary Care

## 2019-05-30 ENCOUNTER — Encounter: Payer: Self-pay | Admitting: Primary Care

## 2019-05-30 ENCOUNTER — Other Ambulatory Visit: Payer: Self-pay

## 2019-05-30 DIAGNOSIS — J189 Pneumonia, unspecified organism: Secondary | ICD-10-CM

## 2019-05-30 MED ORDER — BENZONATATE 100 MG PO CAPS: 100.0000 mg | ORAL_CAPSULE | Freq: Three times a day (TID) | ORAL | 0 refills | Status: DC

## 2019-05-30 MED ORDER — SALINE SPRAY 0.65 % NA SOLN: 2.0000 | NASAL | 1 refills | Status: DC | PRN

## 2019-05-30 MED ORDER — GUAIFENESIN ER 600 MG PO TB12: 1200.0000 mg | ORAL_TABLET | Freq: Two times a day (BID) | ORAL | 0 refills | Status: AC

## 2019-05-30 NOTE — Progress Notes (Signed)
Virtual Visit via Telephone Note  I connected with Patricia Ramsey on 05/30/19 at 11:30 AM EST by telephone and verified that I am speaking with the correct person using two identifiers.  Location: Patient: Home Provider: Office   I discussed the limitations, risks, security and privacy concerns of performing an evaluation and management service by telephone and the availability of in person appointments. I also discussed with the patient that there may be a patient responsible charge related to this service. The patient expressed understanding and agreed to proceed.   History of Present Illness: 53 year old female, never smoked. PMH significant for sleep apnea, cough variant asthma, HTN, PE (on Eliquis). Patient of Dr. Halford Chessman, last seen on 05/19/19 for suspected COVID. Granddaughter was positive Covid. She was seen in ED on 12/21 and diagnosed with community acquired pneumonia. She tested negative for COVID. Discharge on oral doxycycline.   05/30/2019  Contacted today for ED follow-up. States that her shortness of breath is better. She continues to have some sinus pressure and headache which she is taking tylenol for. Nasal mucus is at times bloody. She is using Flonase nasal spray. Completed doxycycline course. Denies fever. Plans to get re-tested for COVID.    Observations/Objective:  - Able to speak in full sentences - No noticeable shortness of breath, wheezing or cough - Some nasal congestion   Assessment and Plan:  Community acquired pneumonia: - Shortness of breath and night sweats have improved - Completed oral courses of both azithromycin and doxycycline - Due for repeat CXR in 4 weeks to ensure resolution  - Needs to be re-tested for covid   Follow Up Instructions:   - As needed if symptoms do not continue to improve or worsen   I discussed the assessment and treatment plan with the patient. The patient was provided an opportunity to ask questions and all were answered.  The patient agreed with the plan and demonstrated an understanding of the instructions.   The patient was advised to call back or seek an in-person evaluation if the symptoms worsen or if the condition fails to improve as anticipated.  I provided 18 minutes of non-face-to-face time during this encounter.   [name], NP

## 2019-06-05 ENCOUNTER — Encounter: Payer: Self-pay | Admitting: Primary Care

## 2019-06-05 NOTE — Patient Instructions (Signed)
Recommend re-testing for covid Due for repeat CXR in 4 weeks to ensure resolution of pneumonia

## 2019-06-06 ENCOUNTER — Ambulatory Visit (INDEPENDENT_AMBULATORY_CARE_PROVIDER_SITE_OTHER): Payer: Medicaid Other | Admitting: Primary Care

## 2019-06-06 ENCOUNTER — Other Ambulatory Visit: Payer: Self-pay

## 2019-06-06 ENCOUNTER — Encounter: Payer: Self-pay | Admitting: Primary Care

## 2019-06-06 DIAGNOSIS — J189 Pneumonia, unspecified organism: Secondary | ICD-10-CM | POA: Diagnosis not present

## 2019-06-06 NOTE — Progress Notes (Signed)
Virtual Visit via Video Note  I connected with Patricia Ramsey on 06/06/19 at 11:00 AM EST by a video enabled telemedicine application and verified that I am speaking with the correct person using two identifiers.  Location: Patient: Home Provider: Office   I discussed the limitations of evaluation and management by telemedicine and the availability of in person appointments. The patient expressed understanding and agreed to proceed.  History of Present Illness: 54 year old female, never smoked. PMH significant for sleep apnea, cough variant asthma, HTN, PE (on Eliquis). Patient of Dr. Halford Chessman, last seen on 05/19/19 for suspected COVID d/t exposure.   Previous [facility] pulmonary encounter: 05/30/2019  Contacted today for ED follow-up for community acquired pneumonia. She was seen in ED on 12/21 and diagnosed with community acquired pneumonia. She tested negative for COVID. Discharged on oral doxycycline. States that her shortness of breath is better. She continues to have some sinus pressure and headache which she is taking tylenol for. Nasal mucus is at times bloody. She is using Flonase nasal spray. Completed doxycycline course. Denies fever. Plans to get re-tested for COVID.    06/06/2019 Patient contacted today for 1 week follow-up CAP. She was recently retested for covid a second time and remains negative. She is feeling a "whole lot better" and back to normal. States that she is not getting up as much mucus and there is no blood. No other acute complaints or concerns. She needs repeat CXR in 2 weeks. Denies fever, chills, sweats.   Observations/Objective:  - No shortness of breath, wheezing or cough noted during conversation - Able to speak in full sentences  Assessment and Plan:  Community acquired pneumonia - Clinically improved; Covid negative x 2 - Previously treated with oral azithromycin and doxycycline course - Needs repeat CXR in 2-3 weeks to ensure resolution of bilateral airspace  opacities   Follow Up Instructions:  - FU as needed - Due for routine OSA visit in October 2021 with Dr. Halford Chessman  I discussed the assessment and treatment plan with the patient. The patient was provided an opportunity to ask questions and all were answered. The patient agreed with the plan and demonstrated an understanding of the instructions.   The patient was advised to call back or seek an in-person evaluation if the symptoms worsen or if the condition fails to improve as anticipated.  I provided 12 minutes of non-face-to-face time during this encounter.   [name], NP

## 2019-06-06 NOTE — Patient Instructions (Addendum)
Glad you are feeling better  Recommendations: - Needs repeat Chest xray in 2 weeks   Follow-up: - September/October 2021 with Dr. Halford Chessman for OSA  Or sooner if needed

## 2019-06-06 NOTE — Progress Notes (Signed)
Reviewed and agree with assessment/plan.   Xion Debruyne, MD [facility] 05/27/2016, 12:24 PM Pager:  [phone]  

## 2019-06-23 ENCOUNTER — Ambulatory Visit (INDEPENDENT_AMBULATORY_CARE_PROVIDER_SITE_OTHER): Payer: Medicaid Other

## 2019-06-23 DIAGNOSIS — J189 Pneumonia, unspecified organism: Secondary | ICD-10-CM

## 2019-06-28 ENCOUNTER — Telehealth: Payer: Self-pay | Admitting: Pulmonary Disease

## 2019-06-28 NOTE — Telephone Encounter (Signed)
[name], NP  06/23/2019 4:08 PM EST    CXR normal. Pneumonia has resolved. I sent patient a mychart message   Called and spoke with pt letting her know the results of the cxr. Pt verbalized understanding. Nothing further needed.

## 2019-08-01 ENCOUNTER — Other Ambulatory Visit: Payer: Self-pay | Admitting: Pulmonary Disease

## 2019-08-01 DIAGNOSIS — R0600 Dyspnea, unspecified: Secondary | ICD-10-CM

## 2019-08-01 MED ORDER — PANTOPRAZOLE SODIUM 40 MG PO TBEC: DELAYED_RELEASE_TABLET | ORAL | 5 refills | Status: DC

## 2019-09-28 ENCOUNTER — Telehealth: Payer: Self-pay | Admitting: Pulmonary Disease

## 2019-09-28 ENCOUNTER — Ambulatory Visit (INDEPENDENT_AMBULATORY_CARE_PROVIDER_SITE_OTHER): Payer: Medicaid Other | Admitting: Adult Health

## 2019-09-28 ENCOUNTER — Encounter: Payer: Self-pay | Admitting: Adult Health

## 2019-09-28 DIAGNOSIS — J45991 Cough variant asthma: Secondary | ICD-10-CM | POA: Diagnosis not present

## 2019-09-28 DIAGNOSIS — J019 Acute sinusitis, unspecified: Secondary | ICD-10-CM | POA: Diagnosis not present

## 2019-09-28 MED ORDER — ALBUTEROL SULFATE (2.5 MG/3ML) 0.083% IN NEBU: 2.5000 mg | INHALATION_SOLUTION | Freq: Four times a day (QID) | RESPIRATORY_TRACT | 3 refills | Status: DC | PRN

## 2019-09-28 MED ORDER — AZITHROMYCIN 250 MG PO TABS: ORAL_TABLET | ORAL | 0 refills | Status: AC

## 2019-09-28 NOTE — Patient Instructions (Addendum)
A Z-Pak to have on hold if symptoms worsen with discolored mucus. Mucinex twice daily as needed for cough and congestion. Saline nasal spray twice daily. Continue on Claritin daily. Restart Nasonex daily. Fluids and rest. COVID-19 testing.  Advised to quarantine until test results are available. Continue on Dulera twice daily. Albuterol as needed. Continue on CPAP. Follow-up as planned and as needed Please contact office for sooner follow up if symptoms do not improve or worsen or seek emergency care

## 2019-09-28 NOTE — Progress Notes (Signed)
Virtual Visit via Telephone Note  I connected with Abram Sander on 09/28/19 at 11:30 AM EDT by telephone and verified that I am speaking with the correct person using two identifiers.  Location: Patient: Home  Provider: Home    I discussed the limitations, risks, security and privacy concerns of performing an evaluation and management service by telephone and the availability of in person appointments. I also discussed with the patient that there may be a patient responsible charge related to this service. The patient expressed understanding and agreed to proceed.   History of Present Illness: 54 year old female never smoker followed for cough variant asthma and obstructive sleep apnea on nocturnal CPAP Today's televisit is an acute office visit , complains of 3 days nasal congestion watery itchy eyes sneezing, cough sinus pain and pressure.  Mild wheezing.  She denies any fever, body aches, loss of taste or smell.  No known sick contacts.  She remains on Dulera twice daily.  Did use her albuterol the last couple days.  She does have sleep apnea.  Says she has been using her CPAP but has been harder with all the nasal congestion.  She remains on Singulair.  Did start Claritin 3 days ago.  Had first Covid vaccine ~ 2 weeks ago.    Observations/Objective:  Spirometry June 2017, FEV1 78%, ratio 79 CT chest May 2016 + for PE CT chest September 2016-PE resolved Venous Dopplers May 2016 right popliteal and peroneal vein DVT 2D echo September 2016 EF 6065%  PSG June 2017-AHI 42, SPO2 low 52%  Assessment and Plan: Acute rhinitis/early sinusitis.  Have recommended she continue on Claritin.  Add in saline nasal spray.  Restart Nasonex  We will give Z-Pak to have on hold if symptoms worsen with discolored mucus.  Have advised her this is most likely a flare of her seasonal rhinitis with the extra pollen outside.  With her acute respiratory symptoms have recommend that she get COVID-19  testing.  Plan  Patient Instructions  A Z-Pak to have on hold if symptoms worsen with discolored mucus. Mucinex twice daily as needed for cough and congestion. Saline nasal spray twice daily. Continue on Claritin daily. Restart Nasonex daily. Fluids and rest. COVID-19 testing.  Advised to quarantine until test results are available. Continue on Dulera twice daily. Albuterol as needed. Continue on CPAP. Follow-up as planned and as needed Please contact office for sooner follow up if symptoms do not improve or worsen or seek emergency care      Follow Up Instructions:    I discussed the assessment and treatment plan with the patient. The patient was provided an opportunity to ask questions and all were answered. The patient agreed with the plan and demonstrated an understanding of the instructions.   The patient was advised to call back or seek an in-person evaluation if the symptoms worsen or if the condition fails to improve as anticipated.  I provided 22 minutes of non-face-to-face time during this encounter.   Rexene Edison, NP

## 2019-09-28 NOTE — Telephone Encounter (Signed)
Called and spoke with pt and was able to get her scheduled for a televisit with Tammy today at 11:30. Nothing further needed.

## 2019-10-02 NOTE — Progress Notes (Signed)
Reviewed and agree with assessment/plan.   Zita Ozimek, MD [facility] 05/27/2016, 12:24 PM Pager:  [phone]  

## 2019-10-12 ENCOUNTER — Other Ambulatory Visit: Payer: Self-pay | Admitting: Primary Care

## 2019-12-28 ENCOUNTER — Ambulatory Visit (INDEPENDENT_AMBULATORY_CARE_PROVIDER_SITE_OTHER): Payer: Medicaid Other | Admitting: Pulmonary Disease

## 2019-12-28 ENCOUNTER — Ambulatory Visit (INDEPENDENT_AMBULATORY_CARE_PROVIDER_SITE_OTHER): Payer: Medicaid Other

## 2019-12-28 ENCOUNTER — Encounter: Payer: Self-pay | Admitting: Pulmonary Disease

## 2019-12-28 ENCOUNTER — Other Ambulatory Visit: Payer: Self-pay

## 2019-12-28 ENCOUNTER — Telehealth: Payer: Self-pay | Admitting: Pulmonary Disease

## 2019-12-28 VITALS — BP 112/72 | HR 82 | Resp 16 | Ht 63.0 in | Wt 261.2 lb

## 2019-12-28 DIAGNOSIS — G4730 Sleep apnea, unspecified: Secondary | ICD-10-CM | POA: Diagnosis not present

## 2019-12-28 DIAGNOSIS — J45991 Cough variant asthma: Secondary | ICD-10-CM

## 2019-12-28 LAB — CBC WITH DIFFERENTIAL/PLATELET
Basophils Absolute: 0 10*3/uL (ref 0.0–0.1)
Basophils Relative: 0.6 % (ref 0.0–3.0)
Eosinophils Absolute: 1 10*3/uL — ABNORMAL HIGH (ref 0.0–0.7)
Eosinophils Relative: 12 % — ABNORMAL HIGH (ref 0.0–5.0)
HCT: 39.2 % (ref 36.0–46.0)
Hemoglobin: 12.7 g/dL (ref 12.0–15.0)
Lymphocytes Relative: 32.3 % (ref 12.0–46.0)
Lymphs Abs: 2.6 10*3/uL (ref 0.7–4.0)
MCHC: 32.5 g/dL (ref 30.0–36.0)
MCV: 89.2 fl (ref 78.0–100.0)
Monocytes Absolute: 0.5 10*3/uL (ref 0.1–1.0)
Monocytes Relative: 6.3 % (ref 3.0–12.0)
Neutro Abs: 4 10*3/uL (ref 1.4–7.7)
Neutrophils Relative %: 48.8 % (ref 43.0–77.0)
Platelets: 274 10*3/uL (ref 150.0–400.0)
RBC: 4.39 Mil/uL (ref 3.87–5.11)
RDW: 15.2 % (ref 11.5–15.5)
WBC: 8.2 10*3/uL (ref 4.0–10.5)

## 2019-12-28 MED ORDER — PREDNISONE 10 MG PO TABS: ORAL_TABLET | ORAL | 0 refills | Status: DC

## 2019-12-28 MED ORDER — DOXYCYCLINE HYCLATE 100 MG PO TABS: 100.0000 mg | ORAL_TABLET | Freq: Two times a day (BID) | ORAL | 0 refills | Status: DC

## 2019-12-28 NOTE — Assessment & Plan Note (Signed)
Plan: Continue CPAP therapy 

## 2019-12-28 NOTE — Progress Notes (Signed)
Reviewed and agree with assessment/plan.   Chesley Mires, MD [facility]/Critical [facility] 12/28/2019, 2:40 PM Pager:  [phone]

## 2019-12-28 NOTE — Patient Instructions (Addendum)
You were seen today by Lauraine Rinne, NP  for:   1. Cough variant asthma vs UACS  - CBC with Differential/Platelet; Future - IgE; Future - DG Chest 2 View; Future - Pulmonary function test; Future - predniSONE (DELTASONE) 10 MG tablet; 4 tabs for 2 days, then 3 tabs for 2 days, 2 tabs for 2 days, then 1 tab for 2 days, then stop  Dispense: 20 tablet; Refill: 0 - doxycycline (VIBRA-TABS) 100 MG tablet; Take 1 tablet (100 mg total) by mouth 2 (two) times daily.  Dispense: 14 tablet; Refill: 0  Labs today  Chest x-ray today  Doxycycline >>> 1 100 mg tablet every 12 hours for 7 days >>>take with food  >>>wear sunscreen   Prednisone 10mg  tablet  >>>4 tabs for 2 days, then 3 tabs for 2 days, 2 tabs for 2 days, then 1 tab for 2 days, then stop >>>take with food  >>>take in the morning   Dulera 100 >>> 2 puffs in the morning right when you wake up, rinse out your mouth after use, 12 hours later 2 puffs, rinse after use >>> Take this daily, no matter what >>> This is not a rescue inhaler   Only use your albuterol as a rescue medication to be used if you can't catch your breath by resting or doing a relaxed purse lip breathing pattern.  - The less you use it, the better it will work when you need it. - Ok to use up to 2 puffs  every 4 hours if you must but call for immediate appointment if use goes up over your usual need - Don't leave home without it !!  (think of it like the spare tire for your car)    2. Sleep apnea in adult   We recommend that you continue using your CPAP daily >>>Keep up the hard work using your device >>> Goal should be wearing this for the entire night that you are sleeping, at least 4 to 6 hours  Remember:   Do not drive or operate heavy machinery if tired or drowsy.   Please notify the supply company and office if you are unable to use your device regularly due to missing supplies or machine being broken.   Work on maintaining a healthy weight and  following your recommended nutrition plan   Maintain proper daily exercise and movement   Maintaining proper use of your device can also help improve management of other chronic illnesses such as: Blood pressure, blood sugars, and weight management.   BiPAP/ CPAP Cleaning:  >>>Clean weekly, with Dawn soap, and bottle brush.  Set up to air dry. >>> Wipe mask out daily with wet wipe or towelette     We recommend today:  Orders Placed This Encounter  Procedures   DG Chest 2 View    Standing Status:   Future    Standing Expiration Date:   04/29/2020    Order Specific Question:   Reason for Exam (SYMPTOM  OR DIAGNOSIS REQUIRED)    Answer:   cough, wheezing    Order Specific Question:   Preferred imaging location?    Answer:   Internal    Order Specific Question:   Radiology Contrast Protocol - do NOT remove file path    Answer:   [email or web address]   CBC with Differential/Platelet    Standing Status:   Future    Standing Expiration Date:   12/27/2020   IgE    Standing Status:   Future  Standing Expiration Date:   12/27/2020   Pulmonary function test    Standing Status:   Future    Standing Expiration Date:   12/27/2020    Order Specific Question:   Where should this test be performed?    Answer:   [facility]    Order Specific Question:   Full PFT: includes the following: basic spirometry, spirometry pre & post bronchodilator, diffusion capacity (DLCO), lung volumes    Answer:   Full PFT   Orders Placed This Encounter  Procedures   DG Chest 2 View   CBC with Differential/Platelet   IgE   Pulmonary function test   Meds ordered this encounter  Medications   predniSONE (DELTASONE) 10 MG tablet    Sig: 4 tabs for 2 days, then 3 tabs for 2 days, 2 tabs for 2 days, then 1 tab for 2 days, then stop    Dispense:  20 tablet    Refill:  0   doxycycline (VIBRA-TABS) 100 MG tablet    Sig: Take 1 tablet (100 mg total) by mouth  2 (two) times daily.    Dispense:  14 tablet    Refill:  0    Follow Up:    Return in about 4 weeks (around 01/25/2020), or if symptoms worsen or fail to improve, for Follow up with Wyn Quaker FNP-C, Follow up for FULL PFT - 60 min.  Patient needs for 2 to 4-week follow-up with either Wyn Quaker, NP or TP NP or Dr. Halford Chessman  Patient needs 60-minute pulmonary function test  Can place recall with Dr. Halford Chessman in 3 to 4 months         Please do your part to reduce the spread of COVID-19:      Reduce your risk of any infection  and COVID19 by using the similar precautions used for avoiding the common cold or flu:   Wash your hands often with soap and warm water for at least 20 seconds.  If soap and water are not readily available, use an alcohol-based hand sanitizer with at least 60% alcohol.   If coughing or sneezing, cover your mouth and nose by coughing or sneezing into the elbow areas of your shirt or coat, into a tissue or into your sleeve (not your hands).  WEAR A MASK when in public   Avoid shaking hands with others and consider head nods or verbal greetings only.  Avoid touching your eyes, nose, or mouth with unwashed hands.   Avoid close contact with people who are sick.  Avoid places or events with large numbers of people in one location, like concerts or sporting events.  If you have some symptoms but not all symptoms, continue to monitor at home and seek medical attention if your symptoms worsen.  If you are having a medical emergency, call 911.   [place] / e-Visit: [email or web address]         [facility]: [facility]: [phone]                   [facility]: [phone]     It is flu season:   >>> Best ways to protect herself from the flu: Receive the yearly flu vaccine, practice good hand hygiene  washing with soap and also using hand sanitizer when available, eat a nutritious meals, get adequate rest, hydrate appropriately   Please contact the office if your symptoms  worsen or you have concerns that you are not improving.   Thank you for choosing [facility] for your healthcare, and for allowing [place] to partner with you on your healthcare journey. I am thankful to be able to provide care to you today.   Wyn Quaker FNP-C

## 2019-12-28 NOTE — Progress Notes (Signed)
LMTCB

## 2019-12-28 NOTE — Progress Notes (Signed)
Spoke with pt and notified of results per Brian. Pt verbalized understanding and denied any questions. °

## 2019-12-28 NOTE — Telephone Encounter (Signed)
Lauraine Rinne, NP  12/28/2019 2:17 PM EDT     Your chest x-ray results of come back. Showing no acute changes. No plan of care changes at this time. Keep follow-up appointment.   Follow-up with our office if symptoms worsen or you do not feel like you are improving under her current regimen.  It was a pleasure taking care of you,  Wyn Quaker, FNP   Attempted to call pt but unable to reach. Left message for her to return call.

## 2019-12-28 NOTE — Progress Notes (Signed)
@Patient  ID: Patricia Ramsey, female    DOB: [birth year], 54 y.o.   MRN: [id number]  Chief Complaint  Patient presents with  . Acute Visit    wheezing for a week now. she has had a recent beach trip.     Referring provider: Lucianne Lei, MD  HPI:  54 year old female never smoker followed in our office for cough variant asthma  PMH: Hypertension, chest pain, morbid obesity Smoker/ Smoking History: Never smoker Maintenance: Dulera 100 Pt of: Dr. Halford Chessman  12/28/2019  - Visit   54 year old female never smoker followed in our office for cough variant asthma.  Patient was last evaluated in our office in April/2021 which was a virtual visit.  At that point in time patient was recommended to obtain Covid testing.  Felt to have acute rhinitis or early sinusitis.  Patient was also encouraged to obtain Covid testing.  Patient obtain Covid testing on 09/29/2019 which appears to been negative.  Patient reporting she is had acute worsening breathing since returning back from the beach.  She is reporting 1 week of increased shortness of breath, wheezing as well as cough.  Her last antibiotic use were in April/2021.  Patient reports compliance with Dulera 100 inhaler.  Previous blood work shows elevated peripheral eosinophil counts.  Patient reports adherence to CPAP use.  CPAP compliance report listed below:  11/28/2019-12/27/2019-CPAP compliance report-27 out of last 30 days used, 26 of those days greater than 4 hours, average usage 7 hours and 50 minutes, CPAP set pressure 14, AHI 0.5  Patient has no issues with CPAP therapy.  Questionaires / Pulmonary Flowsheets:   ACT:  No flowsheet data found.  MMRC: mMRC Dyspnea Scale mMRC Score  12/28/2019 0    Epworth:  No flowsheet data found.  Tests:   Spirometry June 2017, FEV1 78%, ratio 79 CT chest May 2016 + for PE CT chest September 2016-PE resolved Venous Dopplers May 2016 right popliteal and peroneal vein DVT 2D echo September 2016 EF  6065%  PSG June 2017-AHI 42, SPO2 low 52%  FENO:  No results found for: NITRICOXIDE  PFT: No flowsheet data found.  WALK:  SIX MIN WALK 02/22/2015  Supplimental Oxygen during Test? (L/min) No  Tech Comments: slow to normal pace/minimal SOB//lmr    Imaging: DG Chest 2 View  Result Date: 12/28/2019 CLINICAL DATA:  Cough and wheezing. EXAM: CHEST - 2 VIEW COMPARISON:  June 23, 2019 FINDINGS: There is no evidence of acute infiltrate, pleural effusion or pneumothorax. The heart size and mediastinal contours are within normal limits. The visualized skeletal structures are unremarkable. IMPRESSION: No active cardiopulmonary disease. Electronically Signed   By: Virgina Norfolk M.D.   On: 12/28/2019 14:09    Lab Results:  CBC    Component Value Date/Time   WBC 7.8 05/22/2019 1633   RBC 4.81 05/22/2019 1633   HGB 14.0 05/22/2019 1633   HCT 42.7 05/22/2019 1633   PLT 290 05/22/2019 1633   MCV 88.8 05/22/2019 1633   MCH 29.1 05/22/2019 1633   MCHC 32.8 05/22/2019 1633   RDW 14.6 05/22/2019 1633   LYMPHSABS 0.8 05/22/2019 1633   MONOABS 0.3 05/22/2019 1633   EOSABS 0.0 05/22/2019 1633   BASOSABS 0.0 05/22/2019 1633    BMET    Component Value Date/Time   NA 136 05/22/2019 1633   K 3.4 (L) 05/22/2019 1633   CL 98 05/22/2019 1633   CO2 26 05/22/2019 1633   GLUCOSE 107 (H) 05/22/2019 1633   BUN 8  05/22/2019 1633   CREATININE 0.92 05/22/2019 1633   CALCIUM 9.0 05/22/2019 1633   GFRNONAA >60 05/22/2019 1633   GFRAA >60 05/22/2019 1633    BNP    Component Value Date/Time   BNP 104.4 (H) 10/09/2014 0641    ProBNP No results found for: PROBNP  Specialty Problems      Pulmonary Problems   Sleep apnea in adult    epworth 11/19/2015  = 20  - split night  11/22/15 Severe obstructive sleep apnea occurred during the diagnostic portion of the study (AHI = 42.6/hour). An optimal PAP pressure was selected for this patient ( 14 cm of water) - No significant central sleep  apnea occurred during the diagnostic portion of the study (CAI = 0.0/hour). - Severe oxygen desaturation was noted during the diagnostic portion of the study (Min O2 = 52.00%).>  [name] and Rx       Dyspnea    02/22/2015  Walked RA x 3 laps @ 185 ft each stopped due to  End of study, moderate  pace, no sob or desat  Or tachycardia - Spirometry 11/19/2015  Min Restrictive changes only       Cough variant asthma vs UACS    06/24/2015  > try dulera 100 2bid - Spirometry 11/19/2015  No obst - 05/17/2017  After extensive coaching inhaler device  effectiveness =    75% from a baseline of 25%      Acute non-recurrent sinusitis   Bronchitis      No Known Allergies  Immunization History  Administered Date(s) Administered  . Influenza,inj,Quad PF,6+ Mos 02/22/2015, 04/07/2016, 05/17/2017, 04/01/2018, 04/21/2019  . PFIZER SARS-COV-2 Vaccination 09/14/2019, 10/05/2019    Past Medical History:  Diagnosis Date  . Asthma   . Diabetes ([place])    boarderline, pt stopped Glucophage  . Hypertension    no meds  . Pulmonary embolism ([place])   . Sleep apnea     Tobacco History: Social History   Tobacco Use  Smoking Status Never Smoker  Smokeless Tobacco Never Used   Counseling given: Not Answered   Continue to not smoke  Outpatient Encounter Medications as of 12/28/2019  Medication Sig  . albuterol (PROAIR HFA) 108 (90 Base) MCG/ACT inhaler 2 puffs every 4 hours as needed only  if your can't catch your breath  . albuterol (PROVENTIL) (2.5 MG/3ML) 0.083% nebulizer solution Take 3 mLs (2.5 mg total) by nebulization every 6 (six) hours as needed for wheezing or shortness of breath.  . ALPRAZolam (XANAX) 0.5 MG tablet Take 0.5 mg by mouth at bedtime.  Marland Kitchen amLODipine (NORVASC) 5 MG tablet Take 5 mg by mouth daily.   Marland Kitchen apixaban (ELIQUIS) 5 MG TABS tablet Take 1 tablet (5 mg total) by mouth 2 (two) times daily.  . famotidine (PEPCID) 20 MG tablet Take 1 tablet (20 mg total) by mouth at bedtime.   Marland Kitchen FLUoxetine (PROZAC) 20 MG capsule Take 20 mg by mouth daily.  Marland Kitchen gabapentin (NEURONTIN) 300 MG capsule Take 1 capsule (300 mg total) by mouth 3 (three) times daily.  . hydrochlorothiazide (HYDRODIURIL) 25 MG tablet Take 25 mg by mouth daily.  . Misc. Devices MISC by Does not apply route at bedtime. CPAP machine  . mometasone (NASONEX) 50 MCG/ACT nasal spray Place 2 sprays into the nose daily as needed.  . mometasone-formoterol (DULERA) 100-5 MCG/ACT AERO Take 2 puffs first thing in am and then another 2 puffs about 12 hours later.  . montelukast (SINGULAIR) 10 MG tablet TAKE  1 TABLET BY MOUTH AT BEDTIME  . ondansetron (ZOFRAN ODT) 8 MG disintegrating tablet Take 1 tablet (8 mg total) by mouth every 8 (eight) hours as needed for nausea or vomiting.  . pantoprazole (PROTONIX) 40 MG tablet take 1 tablet by mouth once daily 30 TO 60 MINUTES BEFORE THE FIRST MEAL OF THE DAY  . rosuvastatin (CRESTOR) 20 MG tablet TK 1 T PO QD  . sodium chloride (OCEAN) 0.65 % SOLN nasal spray Place 2 sprays into both nostrils as needed for congestion.  Marland Kitchen doxycycline (VIBRA-TABS) 100 MG tablet Take 1 tablet (100 mg total) by mouth 2 (two) times daily.  . predniSONE (DELTASONE) 10 MG tablet 4 tabs for 2 days, then 3 tabs for 2 days, 2 tabs for 2 days, then 1 tab for 2 days, then stop  . [DISCONTINUED] benzonatate (TESSALON) 100 MG capsule Take 1 capsule (100 mg total) by mouth every 8 (eight) hours.  . [DISCONTINUED] doxycycline (VIBRA-TABS) 100 MG tablet Take 1 tablet (100 mg total) by mouth 2 (two) times daily.   No facility-administered encounter medications on file as of 12/28/2019.     Review of Systems  Review of Systems  Constitutional: Positive for fatigue. Negative for activity change and fever.  HENT: Negative for sinus pressure, sinus pain and sore throat.   Respiratory: Positive for cough, shortness of breath and wheezing.   Cardiovascular: Negative for chest pain and palpitations.  Gastrointestinal:  Negative for diarrhea, nausea and vomiting.  Musculoskeletal: Negative for arthralgias.  Neurological: Negative for dizziness.  Psychiatric/Behavioral: Negative for sleep disturbance. The patient is not nervous/anxious.      Physical Exam  BP 112/72 (BP Location: Left Arm, Patient Position: Sitting, Cuff Size: Normal)   Pulse 82   Resp 16   Ht 5\' 3"  (1.6 m)   Wt (!) 261 lb 3.2 oz (118.5 kg)   SpO2 93%   BMI 46.27 kg/m   Wt Readings from Last 5 Encounters:  12/28/19 (!) 261 lb 3.2 oz (118.5 kg)  05/04/19 258 lb (117 kg)  12/16/18 228 lb (103.4 kg)  05/09/18 227 lb 12.8 oz (103.3 kg)  04/01/18 228 lb (103.4 kg)    BMI Readings from Last 5 Encounters:  12/28/19 46.27 kg/m  05/04/19 45.70 kg/m  12/16/18 40.39 kg/m  05/09/18 40.35 kg/m  04/01/18 40.39 kg/m     Physical Exam Vitals and nursing note reviewed.  Constitutional:      General: She is not in acute distress.    Appearance: Normal appearance. She is obese.  HENT:     Head: Normocephalic and atraumatic.     Right Ear: Tympanic membrane, ear canal and external ear normal. There is no impacted cerumen.     Left Ear: Tympanic membrane, ear canal and external ear normal. There is no impacted cerumen.     Nose: Nose normal. No congestion.     Mouth/Throat:     Mouth: Mucous membranes are moist.     Pharynx: Oropharynx is clear.  Eyes:     Pupils: Pupils are equal, round, and reactive to light.  Cardiovascular:     Rate and Rhythm: Normal rate and regular rhythm.     Pulses: Normal pulses.     Heart sounds: Normal heart sounds. No murmur heard.   Pulmonary:     Breath sounds: No decreased air movement. Wheezing (exp wheeze) present. No decreased breath sounds or rales.  Musculoskeletal:     Cervical back: Normal range of motion.  Right lower leg: No edema.     Left lower leg: No edema.  Skin:    General: Skin is warm and dry.     Capillary Refill: Capillary refill takes less than 2 seconds.   Neurological:     General: No focal deficit present.     Mental Status: She is alert and oriented to person, place, and time. Mental status is at baseline.     Gait: Gait normal.  Psychiatric:        Mood and Affect: Mood normal.        Behavior: Behavior normal.        Thought Content: Thought content normal.        Judgment: Judgment normal.       Assessment & Plan:   Sleep apnea in adult Plan: Continue CPAP therapy  Cough variant asthma vs UACS Suspected asthma flare Previous elevations of peripheral eosinophilia Recently treated with Z-Pak in April/2021  Plan: Doxycycline today Prednisone taper today Lab work today Chest x-ray today Continue Dulera 100     Return in about 4 weeks (around 01/25/2020), or if symptoms worsen or fail to improve, for Follow up with Wyn Quaker FNP-C, Follow up for FULL PFT - 60 min.   Lauraine Rinne, NP 12/28/2019   This appointment required 28 minutes of patient care (this includes precharting, chart review, review of results, face-to-face care, etc.).

## 2019-12-28 NOTE — Assessment & Plan Note (Signed)
Suspected asthma flare Previous elevations of peripheral eosinophilia Recently treated with Z-Pak in April/2021  Plan: Doxycycline today Prednisone taper today Lab work today Chest x-ray today Continue Dulera 100

## 2019-12-29 LAB — IGE: IgE (Immunoglobulin E), Serum: 55 kU/L (ref <=114)

## 2019-12-29 NOTE — Progress Notes (Signed)
Spoke with pt and notified of results per Dr. Wert. Pt verbalized understanding and denied any questions. 

## 2020-01-01 NOTE — Telephone Encounter (Signed)
Spoke with pt. She is aware of results. Nothing further was needed.  

## 2020-01-17 ENCOUNTER — Telehealth: Payer: Self-pay | Admitting: Radiology

## 2020-01-17 ENCOUNTER — Encounter: Payer: Self-pay | Admitting: Surgery

## 2020-01-17 ENCOUNTER — Ambulatory Visit: Payer: Self-pay

## 2020-01-17 ENCOUNTER — Telehealth: Payer: Self-pay

## 2020-01-17 ENCOUNTER — Ambulatory Visit (INDEPENDENT_AMBULATORY_CARE_PROVIDER_SITE_OTHER): Payer: Medicaid Other | Admitting: Surgery

## 2020-01-17 ENCOUNTER — Ambulatory Visit: Payer: Medicaid Other | Admitting: Pulmonary Disease

## 2020-01-17 VITALS — BP 118/86 | HR 88 | Ht 63.0 in | Wt 264.6 lb

## 2020-01-17 DIAGNOSIS — M545 Low back pain, unspecified: Secondary | ICD-10-CM

## 2020-01-17 DIAGNOSIS — G8929 Other chronic pain: Secondary | ICD-10-CM

## 2020-01-17 DIAGNOSIS — M170 Bilateral primary osteoarthritis of knee: Secondary | ICD-10-CM | POA: Diagnosis not present

## 2020-01-17 DIAGNOSIS — M1711 Unilateral primary osteoarthritis, right knee: Secondary | ICD-10-CM

## 2020-01-17 DIAGNOSIS — M1712 Unilateral primary osteoarthritis, left knee: Secondary | ICD-10-CM

## 2020-01-17 DIAGNOSIS — M25562 Pain in left knee: Secondary | ICD-10-CM

## 2020-01-17 NOTE — Progress Notes (Signed)
Office Visit Note   Patient: Patricia Ramsey           Date of Birth: Oct 27, 1965           MRN: [id number] Visit Date: 01/17/2020              Requested by: Lucianne Lei, [address] [place],  [place] [address] PCP: Lucianne Lei, MD   Assessment & Plan: Visit Diagnoses:  1. Chronic pain of both knees   2. Chronic left-sided low back pain, unspecified whether sciatica present   3. Arthritis of knee, right   4. Arthritis of left knee     Plan: Patient understands in regards to both knees definitive treatment is total knee replacements but per Dr. Narda Amber last office note July 2020 he wanted her BMI below 40.  Patient understand that she is not a candidate for surgery at this time.  In hopes of giving her some improvement of her right knee I did elect to proceed with doing intra-articular Marcaine/Depo-Medrol injection.  After patient consent injection was performed.  I will have patient follow-up in a few weeks to start viscosupplementation.  Since she is on oral anticoagulant she is noted to take oral NSAIDs.  We will give her back some time to see how this does.  She may need repeat MRI if not improved.  Follow-Up Instructions: Return in about 3 weeks (around 02/07/2020) for with Brandi Armato to start bilat knee visco series.   Orders:  Orders Placed This Encounter  Procedures  . Large Joint Inj: R knee  . XR Knee 1-2 Views Right  . XR KNEE 3 VIEW LEFT  . XR Lumbar Spine 2-3 Views   No orders of the defined types were placed in this encounter.     Procedures: Large Joint Inj: R knee on 01/17/2020 10:09 AM Indications: pain Details: 25 G 1.5 in needle Medications: 3 mL lidocaine 1 %; 6 mL bupivacaine 0.25 %; 40 mg methylPREDNISolone acetate 40 MG/ML Consent was given by the patient. Patient was prepped and draped in the usual sterile fashion.       Clinical Data: No additional findings.   Subjective: Chief Complaint  Patient presents with  . Left Knee - Pain  .  Right Knee - Pain  . Lower Back - Pain    HPI 54 year old white female returns with complaints of bilateral knee pain.  She has known history of end-stage DJD.  July 2020 Dr. Lorin Mercy had mentioned doing bilateral total knee replacements if she can get her BMI below 40.  Pain unchanged.  Still bothered with activity.  She is also had some pain in her low back.  No radiation down her legs.  States that she has a history of scoliosis.  She also has known history of lumbar spondylosis and MRI scan from 2018 showed:  EXAM: MRI LUMBAR SPINE WITHOUT CONTRAST  TECHNIQUE: Multiplanar, multisequence MR imaging of the lumbar spine was performed. No intravenous contrast was administered.  COMPARISON:  Lumbar spine radiographs 01/17/2014.  FINDINGS: Segmentation:  Standard.  Alignment:  Physiologic.  Vertebrae: No worrisome on his lesion. Diffuse low signal intensity bone marrow could be related to body habitus or chronic disease.  Conus medullaris: Extends to the L1 level and appears normal.  Paraspinal and other soft tissues: Renal cystic disease appears mildly increased from prior CT. Consider renal ultrasound for further evaluation.  Disc levels:  L1-L2:  Normal.  L2-L3:  Normal.  L3-L4: Disc desiccation  and slight loss of disc height. Annular bulge. Facet arthropathy. No impingement.  L4-L5: Disc desiccation. Central and leftward protrusion. Facet arthropathy and ligamentum flavum hypertrophy. Slight subarticular zone and foraminal zone narrowing could affect the LEFT L4 and/or LEFT L5 nerve roots.  L5-S1: Unremarkable disc space. Facet arthropathy. No impingement.  IMPRESSION: Central and leftward protrusion at L4-5 is fairly shallow, but in conjunction with posterior element hypertrophy, contributes to subarticular zone and foraminal zone narrowing. Correlate clinically for LEFT L4 and/or LEFT L5 nerve root impingement.  Low signal intensity bone marrow  diffusely, favored to be related to body habitus.  Renal cystic disease, appears progressed from 2010. Consider renal sonography for further evaluation.   Electronically Signed   By: Staci Righter M.D.   On: 07/11/2016 09:03  Patient is currently on chronic Eliquis and unable to take oral NSAIDs.    Review of Systems No current cardiac pulmonary GI GU issues  Objective: Vital Signs: BP 118/86   Pulse 88   Ht 5\' 3"  (1.6 m)   Wt 264 lb 9.6 oz (120 kg)   BMI 46.87 kg/m   Physical Exam HENT:     Head: Normocephalic and atraumatic.  Eyes:     Extraocular Movements: Extraocular movements intact.     Pupils: Pupils are equal, round, and reactive to light.  Pulmonary:     Effort: No respiratory distress.  Musculoskeletal:     Comments: Mild lumbar paraspinal tenderness/spasm.  Bilateral knees good range of motion.  Positive crepitus.  Joint line is tender.  Some swelling without large effusions.  Bilateral calves are nontender.  Neurological:     General: No focal deficit present.     Mental Status: She is alert and oriented to person, place, and time.  Psychiatric:        Mood and Affect: Mood normal.     Ortho Exam  Specialty Comments:  No specialty comments available.  Imaging: No results found.   PMFS History: Patient Active Problem List   Diagnosis Date Noted  . Eosinophilia 01/18/2020  . Bilateral primary osteoarthritis of knee 12/16/2018  . Acute non-recurrent sinusitis 05/10/2018  . Bronchitis 05/10/2018  . Acute left-sided low back pain without sciatica 05/10/2018  . Chronic low back pain without sciatica 07/28/2016  . Cough variant asthma vs UACS 06/24/2015  . Morbid obesity due to excess calories ([place]) 02/23/2015  . Dyspnea 02/22/2015  . Acute pulmonary embolism ([place]) 10/09/2014  . HTN (hypertension) 10/09/2014  . Sleep apnea in adult 10/09/2014  . Chest pain    Past Medical History:  Diagnosis Date  . Asthma   . Diabetes ([place])     boarderline, pt stopped Glucophage  . Hypertension    no meds  . Pulmonary embolism ([place])   . Sleep apnea     Family History  Problem Relation Age of Onset  . Heart failure Mother   . Hypertension Mother     Past Surgical History:  Procedure Laterality Date  . ANKLE SURGERY    . CHONDROPLASTY Right 09/21/2014   Procedure: CHONDROPLASTY right knee;  Surgeon: [name], MD;  [facility];  Service: Orthopedics;  Laterality: Right;  . KNEE ARTHROSCOPY Right 09/21/2014   Procedure:  right knee arthroscopy;  Surgeon: [name], MD;  Location: [facility];  Service: Orthopedics;  Laterality: Right;  . LITHOTRIPSY    . oophrectomy    . TUBAL LIGATION     Social History   Occupational History  .  Occupation: unemployed  Tobacco Use  . Smoking status: Never Smoker  . Smokeless tobacco: Never Used  Vaping Use  . Vaping Use: Never used  Substance and Sexual Activity  . Alcohol use: No  . Drug use: No  . Sexual activity: Not on file

## 2020-01-17 NOTE — Telephone Encounter (Signed)
Noted  

## 2020-01-17 NOTE — Telephone Encounter (Signed)
Will mail [employer] application to patient's address listed due to patient having medicaid as her insurance.  Appointment can not be made until medication is received from [employer], which can take up to 4-6 weeks.

## 2020-01-17 NOTE — Telephone Encounter (Signed)
Mailed [employer] application to patient's address listed for Orthovisc, bilateral knee.

## 2020-01-17 NOTE — Telephone Encounter (Signed)
Please obtain authorization for bilateral knee orthovisc injections for Patricia Ramsey. He would like for patient to follow up with him for injections. Thanks.

## 2020-01-18 ENCOUNTER — Ambulatory Visit (INDEPENDENT_AMBULATORY_CARE_PROVIDER_SITE_OTHER): Payer: Medicaid Other | Admitting: Pulmonary Disease

## 2020-01-18 ENCOUNTER — Encounter: Payer: Self-pay | Admitting: Pulmonary Disease

## 2020-01-18 ENCOUNTER — Other Ambulatory Visit: Payer: Self-pay

## 2020-01-18 VITALS — BP 120/78 | HR 82 | Temp 96.5°F | Ht 64.0 in | Wt 260.0 lb

## 2020-01-18 DIAGNOSIS — G4730 Sleep apnea, unspecified: Secondary | ICD-10-CM

## 2020-01-18 DIAGNOSIS — D721 Eosinophilia, unspecified: Secondary | ICD-10-CM | POA: Insufficient documentation

## 2020-01-18 DIAGNOSIS — D7219 Other eosinophilia: Secondary | ICD-10-CM

## 2020-01-18 DIAGNOSIS — J45991 Cough variant asthma: Secondary | ICD-10-CM

## 2020-01-18 LAB — PULMONARY FUNCTION TEST
DL/VA % pred: 140 %
DL/VA: 6.04 ml/min/mmHg/L
DLCO cor % pred: 107 %
DLCO cor: 21.34 ml/min/mmHg
DLCO unc % pred: 104 %
DLCO unc: 20.87 ml/min/mmHg
FEF 25-75 Post: 2.15 L/sec
FEF 25-75 Pre: 1.41 L/sec
FEF2575-%Change-Post: 53 %
FEF2575-%Pred-Post: 98 %
FEF2575-%Pred-Pre: 64 %
FEV1-%Change-Post: 11 %
FEV1-%Pred-Post: 83 %
FEV1-%Pred-Pre: 75 %
FEV1-Post: 1.78 L
FEV1-Pre: 1.6 L
FEV1FVC-%Change-Post: 5 %
FEV1FVC-%Pred-Pre: 97 %
FEV6-%Change-Post: 5 %
FEV6-%Pred-Post: 83 %
FEV6-%Pred-Pre: 78 %
FEV6-Post: 2.16 L
FEV6-Pre: 2.04 L
FEV6FVC-%Pred-Post: 103 %
FEV6FVC-%Pred-Pre: 103 %
FVC-%Change-Post: 6 %
FVC-%Pred-Post: 81 %
FVC-%Pred-Pre: 76 %
FVC-Post: 2.17 L
FVC-Pre: 2.04 L
Post FEV1/FVC ratio: 82 %
Post FEV6/FVC ratio: 100 %
Pre FEV1/FVC ratio: 78 %
Pre FEV6/FVC Ratio: 100 %
RV % pred: 116 %
RV: 2.15 L
TLC % pred: 88 %
TLC: 4.33 L

## 2020-01-18 LAB — CBC WITH DIFFERENTIAL/PLATELET
Basophils Absolute: 0 10*3/uL (ref 0.0–0.1)
Basophils Relative: 0.3 % (ref 0.0–3.0)
Eosinophils Absolute: 0.3 10*3/uL (ref 0.0–0.7)
Eosinophils Relative: 3.4 % (ref 0.0–5.0)
HCT: 39.9 % (ref 36.0–46.0)
Hemoglobin: 13.1 g/dL (ref 12.0–15.0)
Lymphocytes Relative: 26 % (ref 12.0–46.0)
Lymphs Abs: 2.1 10*3/uL (ref 0.7–4.0)
MCHC: 32.9 g/dL (ref 30.0–36.0)
MCV: 88.8 fl (ref 78.0–100.0)
Monocytes Absolute: 0.7 10*3/uL (ref 0.1–1.0)
Monocytes Relative: 8.1 % (ref 3.0–12.0)
Neutro Abs: 5.1 10*3/uL (ref 1.4–7.7)
Neutrophils Relative %: 62.2 % (ref 43.0–77.0)
Platelets: 271 10*3/uL (ref 150.0–400.0)
RBC: 4.5 Mil/uL (ref 3.87–5.11)
RDW: 15.6 % — ABNORMAL HIGH (ref 11.5–15.5)
WBC: 8.1 10*3/uL (ref 4.0–10.5)

## 2020-01-18 NOTE — Progress Notes (Signed)
@Patient  ID: Patricia Ramsey, female    DOB: 1965/09/29, 54 y.o.   MRN: [id number]  Chief Complaint  Patient presents with  . Follow-up    here to go over pft    Referring provider: Lucianne Lei, MD  HPI:  54 year old female never smoker followed in our office for cough variant asthma  PMH: Hypertension, chest pain, morbid obesity Smoker/ Smoking History: Never smoker Maintenance: Dulera 100 Pt of: Dr. Halford Ramsey  01/18/2020  - Visit   54 year old female never smoker followed in our office for cough variant asthma.  Patient presenting to office today after completing pulmonary function testing.  Those results are listed below:  01/18/2020-pulmonary function test-FVC 2.04 (76% predicted), postbronchodilator ratio 82, postbronchodilator FEV1 1.78 (83% predicted),  no postbronchodilator response, mid flow reversibility, TLC 4.33 (88% predicted), DLCO 20.87 (104% predicted)  Plan of care at last office visit was to recommend the patient remain on CPAP therapy, she was treated with doxycycline and a prednisone taper and requested to remain on Dulera 100.  Patient reporting today she is feeling better since being treated with prednisone and doxycycline.  She typically feels significantly better whenever on steroids.  We will discuss and evaluate this today.  Repeat lab work from last office visit showed an elevated eosinophil count of 1000.  IgE was normal.  Patient has previously had elevated eosinophil counts in 300-400.  Patient is occasionally having to use her rescue inhaler about 1-2 times a week.  She reports adherence to CPAP therapy.  We do not have a compliance report.  Her DME company is [employer].  We will request the patient be added to [facility] and request a compliance report today.  Questionaires / Pulmonary Flowsheets:   ACT:  No flowsheet data found.  MMRC: mMRC Dyspnea Scale mMRC Score  12/28/2019 0    Epworth:  No flowsheet data found.  Tests:   Spirometry June  2017, FEV1 78%, ratio 79 CT chest May 2016 + for PE CT chest September 2016-PE resolved Venous Dopplers May 2016 right popliteal and peroneal vein DVT 2D echo September 2016 EF 6065%  PSG June 2017-AHI 42, SPO2 low 52%  01/18/2020-pulmonary function test-FVC 2.04 (76% predicted), postbronchodilator ratio 82, postbronchodilator FEV1 1.78 (83% predicted),  no postbronchodilator response, mid flow reversibility, TLC 4.33 (88% predicted), DLCO 20.87 (104% predicted)   FENO:  No results found for: NITRICOXIDE  PFT: PFT Results Latest Ref Rng & Units 01/18/2020  FVC-Pre L 2.04  FVC-Predicted Pre % 76  FVC-Post L 2.17  FVC-Predicted Post % 81  Pre FEV1/FVC % % 78  Post FEV1/FCV % % 82  FEV1-Pre L 1.60  FEV1-Predicted Pre % 75  FEV1-Post L 1.78  DLCO uncorrected ml/min/mmHg 20.87  DLCO UNC% % 104  DLCO corrected ml/min/mmHg 21.34  DLCO COR %Predicted % 107  DLVA Predicted % 140  TLC L 4.33  TLC % Predicted % 88  RV % Predicted % 116    WALK:  SIX MIN WALK 02/22/2015  Supplimental Oxygen during Test? (L/min) No  Tech Comments: slow to normal pace/minimal SOB//lmr    Imaging: DG Chest 2 View  Result Date: 12/28/2019 CLINICAL DATA:  Cough and wheezing. EXAM: CHEST - 2 VIEW COMPARISON:  June 23, 2019 FINDINGS: There is no evidence of acute infiltrate, pleural effusion or pneumothorax. The heart size and mediastinal contours are within normal limits. The visualized skeletal structures are unremarkable. IMPRESSION: No active cardiopulmonary disease. Electronically Signed   By: Patricia Ramsey.D.  On: 12/28/2019 14:09    Lab Results:  CBC    Component Value Date/Time   WBC 8.2 12/28/2019 1408   RBC 4.39 12/28/2019 1408   HGB 12.7 12/28/2019 1408   HCT 39.2 12/28/2019 1408   PLT 274.0 12/28/2019 1408   MCV 89.2 12/28/2019 1408   MCH 29.1 05/22/2019 1633   MCHC 32.5 12/28/2019 1408   RDW 15.2 12/28/2019 1408   LYMPHSABS 2.6 12/28/2019 1408   MONOABS 0.5 12/28/2019  1408   EOSABS 1.0 (H) 12/28/2019 1408   BASOSABS 0.0 12/28/2019 1408    BMET    Component Value Date/Time   NA 136 05/22/2019 1633   K 3.4 (L) 05/22/2019 1633   CL 98 05/22/2019 1633   CO2 26 05/22/2019 1633   GLUCOSE 107 (H) 05/22/2019 1633   BUN 8 05/22/2019 1633   CREATININE 0.92 05/22/2019 1633   CALCIUM 9.0 05/22/2019 1633   GFRNONAA >60 05/22/2019 1633   GFRAA >60 05/22/2019 1633    BNP    Component Value Date/Time   BNP 104.4 (H) 10/09/2014 0641    ProBNP No results found for: PROBNP  Specialty Problems      Pulmonary Problems   Sleep apnea in adult    epworth 11/19/2015  = 20  - split night  11/22/15 Severe obstructive sleep apnea occurred during the diagnostic portion of the study (AHI = 42.6/hour). An optimal PAP pressure was selected for this patient ( 14 cm of water) - No significant central sleep apnea occurred during the diagnostic portion of the study (CAI = 0.0/hour). - Severe oxygen desaturation was noted during the diagnostic portion of the study (Min O2 = 52.00%).>  Patricia Ramsey and Rx       Dyspnea    02/22/2015  Walked RA x 3 laps @ 185 ft each stopped due to  End of study, moderate  pace, no sob or desat  Or tachycardia - Spirometry 11/19/2015  Min Restrictive changes only       Cough variant asthma vs UACS    06/24/2015  > try dulera 100 2bid - Spirometry 11/19/2015  No obst - 05/17/2017  After extensive coaching inhaler device  effectiveness =    75% from a baseline of 25%      Acute non-recurrent sinusitis   Bronchitis      No Known Allergies  Immunization History  Administered Date(s) Administered  . Influenza,inj,Quad PF,6+ Mos 02/22/2015, 04/07/2016, 05/17/2017, 04/01/2018, 04/21/2019  . PFIZER SARS-COV-2 Vaccination 09/14/2019, 10/05/2019    Past Medical History:  Diagnosis Date  . Asthma   . Diabetes ([place])    boarderline, pt stopped Glucophage  . Hypertension    no meds  . Pulmonary embolism ([place])   . Sleep apnea      Tobacco History: Social History   Tobacco Use  Smoking Status Never Smoker  Smokeless Tobacco Never Used   Counseling given: Not Answered   Continue to not smoke  Outpatient Encounter Medications as of 01/18/2020  Medication Sig  . albuterol (PROAIR HFA) 108 (90 Base) MCG/ACT inhaler 2 puffs every 4 hours as needed only  if your can't catch your breath  . albuterol (PROVENTIL) (2.5 MG/3ML) 0.083% nebulizer solution Take 3 mLs (2.5 mg total) by nebulization every 6 (six) hours as needed for wheezing or shortness of breath.  . ALPRAZolam (XANAX) 0.5 MG tablet Take 0.5 mg by mouth at bedtime.  Marland Kitchen amLODipine (NORVASC) 5 MG tablet Take 5 mg by mouth daily.   Marland Kitchen apixaban (  ELIQUIS) 5 MG TABS tablet Take 1 tablet (5 mg total) by mouth 2 (two) times daily.  Marland Kitchen doxycycline (VIBRA-TABS) 100 MG tablet Take 1 tablet (100 mg total) by mouth 2 (two) times daily.  . famotidine (PEPCID) 20 MG tablet Take 1 tablet (20 mg total) by mouth at bedtime.  Marland Kitchen FLUoxetine (PROZAC) 20 MG capsule Take 20 mg by mouth daily.  Marland Kitchen gabapentin (NEURONTIN) 300 MG capsule Take 1 capsule (300 mg total) by mouth 3 (three) times daily.  . hydrochlorothiazide (HYDRODIURIL) 25 MG tablet Take 25 mg by mouth daily.  . Misc. Devices MISC by Does not apply route at bedtime. CPAP machine  . mometasone (NASONEX) 50 MCG/ACT nasal spray Place 2 sprays into the nose daily as needed.  . mometasone-formoterol (DULERA) 100-5 MCG/ACT AERO Take 2 puffs first thing in am and then another 2 puffs about 12 hours later.  . montelukast (SINGULAIR) 10 MG tablet TAKE 1 TABLET BY MOUTH AT BEDTIME  . ondansetron (ZOFRAN ODT) 8 MG disintegrating tablet Take 1 tablet (8 mg total) by mouth every 8 (eight) hours as needed for nausea or vomiting.  Marland Kitchen OZEMPIC, 1 MG/DOSE, 4 MG/3ML SOPN INJECT 1 MG INTO THE SKIN EVERY WEEK  . pantoprazole (PROTONIX) 40 MG tablet take 1 tablet by mouth once daily 30 TO 60 MINUTES BEFORE THE FIRST MEAL OF THE DAY  . predniSONE  (DELTASONE) 10 MG tablet 4 tabs for 2 days, then 3 tabs for 2 days, 2 tabs for 2 days, then 1 tab for 2 days, then stop  . rosuvastatin (CRESTOR) 20 MG tablet TK 1 T PO QD  . sodium chloride (OCEAN) 0.65 % SOLN nasal spray Place 2 sprays into both nostrils as needed for congestion.   No facility-administered encounter medications on file as of 01/18/2020.     Review of Systems  Review of Systems  Constitutional: Negative for activity change, fatigue and fever.  HENT: Negative for sinus pressure, sinus pain and sore throat.   Respiratory: Negative for cough, shortness of breath and wheezing.   Cardiovascular: Negative for chest pain and palpitations.  Gastrointestinal: Negative for diarrhea, nausea and vomiting.  Musculoskeletal: Negative for arthralgias.  Neurological: Negative for dizziness.  Psychiatric/Behavioral: Negative for sleep disturbance. The patient is not nervous/anxious.      Physical Exam  BP 120/78 (BP Location: Left Arm, Patient Position: Sitting, Cuff Size: Normal)   Pulse 82   Temp (!) 96.5 F (35.8 C) (Oral)   Ht 5\' 4"  (1.626 m)   Wt 260 lb (117.9 kg)   SpO2 98%   BMI 44.63 kg/m   Wt Readings from Last 5 Encounters:  01/18/20 260 lb (117.9 kg)  01/17/20 264 lb 9.6 oz (120 kg)  12/28/19 (!) 261 lb 3.2 oz (118.5 kg)  05/04/19 258 lb (117 kg)  12/16/18 228 lb (103.4 kg)    BMI Readings from Last 5 Encounters:  01/18/20 44.63 kg/m  01/17/20 46.87 kg/m  12/28/19 46.27 kg/m  05/04/19 45.70 kg/m  12/16/18 40.39 kg/m     Physical Exam Vitals and nursing note reviewed.  Constitutional:      General: She is not in acute distress.    Appearance: Normal appearance. She is obese.  HENT:     Head: Normocephalic and atraumatic.     Right Ear: External ear normal.     Left Ear: External ear normal.     Nose: Nose normal. No congestion.     Mouth/Throat:     Mouth: Mucous membranes  are moist.     Pharynx: Oropharynx is clear.  Eyes:     Pupils:  Pupils are equal, round, and reactive to light.  Cardiovascular:     Rate and Rhythm: Normal rate and regular rhythm.     Pulses: Normal pulses.     Heart sounds: Normal heart sounds. No murmur heard.   Pulmonary:     Effort: Pulmonary effort is normal. No respiratory distress.     Breath sounds: Normal breath sounds. No decreased air movement. No decreased breath sounds, wheezing or rales.  Musculoskeletal:     Cervical back: Normal range of motion.     Right lower leg: No edema.     Left lower leg: No edema.  Skin:    General: Skin is warm and dry.     Capillary Refill: Capillary refill takes less than 2 seconds.  Neurological:     General: No focal deficit present.     Mental Status: She is alert and oriented to person, place, and time. Mental status is at baseline.     Gait: Gait normal.  Psychiatric:        Mood and Affect: Mood normal.        Behavior: Behavior normal.        Thought Content: Thought content normal.        Judgment: Judgment normal.       Assessment & Plan:   Sleep apnea in adult Plan: Continue CPAP therapy Sent message to [employer] for patient to be added to [facility], also requesting compliance report  Cough variant asthma vs UACS Clinically improved since recent exacerbation  Plan: Continue Dulera 100 We will repeat lab work today If eosinophilia persists we will need to consider hematology versus allergy referral  Eosinophilia Plan: Lab work today    Return in about 2 months (around 03/19/2020), or if symptoms worsen or fail to improve, for Follow up with Dr. Halford Ramsey.   Lauraine Rinne, NP 01/18/2020   This appointment required 26 minutes of patient care (this includes precharting, chart review, review of results, face-to-face care, etc.).

## 2020-01-18 NOTE — Assessment & Plan Note (Signed)
Plan: Continue CPAP therapy Sent message to [employer] for patient to be added to [facility], also requesting compliance report

## 2020-01-18 NOTE — Addendum Note (Signed)
Addended by: Suzzanne Cloud E on: 01/18/2020 01:57 PM   Modules accepted: Orders

## 2020-01-18 NOTE — Progress Notes (Signed)
Reviewed and agree with assessment/plan.   Chesley Mires, MD [facility] Pulmonary/Critical Care 01/18/2020, 2:11 PM Pager:  [phone]

## 2020-01-18 NOTE — Assessment & Plan Note (Signed)
Plan: Lab work today 

## 2020-01-18 NOTE — Assessment & Plan Note (Signed)
Clinically improved since recent exacerbation  Plan: Continue Dulera 100 We will repeat lab work today If eosinophilia persists we will need to consider hematology versus allergy referral

## 2020-01-18 NOTE — Progress Notes (Signed)
Discussed results with patient in office.  Nothing further is needed at this time.  Shamia Uppal FNP  

## 2020-01-18 NOTE — Progress Notes (Signed)
Full PFT performed today. °

## 2020-01-18 NOTE — Patient Instructions (Addendum)
You were seen today by Lauraine Rinne, NP  for:   1. Cough variant asthma vs UACS  Dulera 100 >>> 2 puffs in the morning right when you wake up, rinse out your mouth after use, 12 hours later 2 puffs, rinse after use >>> Take this daily, no matter what >>> This is not a rescue inhaler   Labwork today   Only use your albuterol as a rescue medication to be used if you can't catch your breath by resting or doing a relaxed purse lip breathing pattern.  - The less you use it, the better it will work when you need it. - Ok to use up to 2 puffs  every 4 hours if you must but call for immediate appointment if use goes up over your usual need - Don't leave home without it !!  (think of it like the spare tire for your car)    2. Eosinophilic leukocytosis, unspecified type  - CBC with Differential/Platelet; Future  3. Sleep apnea in adult  We recommend that you continue using your CPAP daily >>>Keep up the hard work using your device >>> Goal should be wearing this for the entire night that you are sleeping, at least 4 to 6 hours  Remember:  . Do not drive or operate heavy machinery if tired or drowsy.  . Please notify the supply company and office if you are unable to use your device regularly due to missing supplies or machine being broken.  . Work on maintaining a healthy weight and following your recommended nutrition plan  . Maintain proper daily exercise and movement  . Maintaining proper use of your device can also help improve management of other chronic illnesses such as: Blood pressure, blood sugars, and weight management.   BiPAP/ CPAP Cleaning:  >>>Clean weekly, with Dawn soap, and bottle brush.  Set up to air dry. >>> Wipe mask out daily with wet wipe or towelette     We recommend today:  Orders Placed This Encounter  Procedures  . CBC with Differential/Platelet    Standing Status:   Future    Standing Expiration Date:   01/17/2021   Orders Placed This Encounter    Procedures  . CBC with Differential/Platelet   No orders of the defined types were placed in this encounter.   Follow Up:    Return in about 2 months (around 03/19/2020), or if symptoms worsen or fail to improve, for Follow up with Dr. Halford Chessman.   Please do your part to reduce the spread of COVID-19:      Reduce your risk of any infection  and COVID19 by using the similar precautions used for avoiding the common cold or flu:  Marland Kitchen Wash your hands often with soap and warm water for at least 20 seconds.  If soap and water are not readily available, use an alcohol-based hand sanitizer with at least 60% alcohol.  . If coughing or sneezing, cover your mouth and nose by coughing or sneezing into the elbow areas of your shirt or coat, into a tissue or into your sleeve (not your hands). Langley Gauss A MASK when in public  . Avoid shaking hands with others and consider head nods or verbal greetings only. . Avoid touching your eyes, nose, or mouth with unwashed hands.  . Avoid close contact with people who are sick. . Avoid places or events with large numbers of people in one location, like concerts or sporting events. . If you have some symptoms  but not all symptoms, continue to monitor at home and seek medical attention if your symptoms worsen. . If you are having a medical emergency, call 911.   [place] / e-Visit: [email or web address]         [facility]: [facility]: [phone]                   [facility]: [phone]     It is flu season:   >>> Best ways to protect herself from the flu: Receive the yearly flu vaccine, practice good hand hygiene washing with soap and also using hand sanitizer when available, eat a nutritious meals, get adequate rest, hydrate appropriately   Please contact the office if your symptoms worsen or you  have concerns that you are not improving.   Thank you for choosing [facility] for your healthcare, and for allowing [place] to partner with you on your healthcare journey. I am thankful to be able to provide care to you today.   Wyn Quaker FNP-C

## 2020-01-24 ENCOUNTER — Telehealth: Payer: Self-pay

## 2020-01-24 NOTE — Telephone Encounter (Signed)
Faxed [employer] application to [phone] for Orthovisc,bilateral knee.

## 2020-01-25 ENCOUNTER — Telehealth: Payer: Self-pay | Admitting: Pulmonary Disease

## 2020-01-25 NOTE — Telephone Encounter (Signed)
Called and spoke with pt letting her know the info from the CPAP compliance report stated by Aaron Edelman. Pt verbalized understanding. Nothing further needed.

## 2020-01-25 NOTE — Telephone Encounter (Signed)
01/25/2020  Please let the patient know we have received your CPAP compliance report.  CPAP compliance report shows excellent compliance as well as well-controlled AHI.  See the report listed below:  12/20/2019-01/18/20-29 on last 30 days used, 29 of those days greater than 4 hours, average usage 8 hours and 5 minutes, CPAP set pressure 14, AHI 0.3  No new recommendations or changes.   Wyn Quaker, FNP

## 2020-02-07 ENCOUNTER — Encounter: Payer: Self-pay | Admitting: Surgery

## 2020-02-07 ENCOUNTER — Ambulatory Visit: Payer: Self-pay

## 2020-02-07 ENCOUNTER — Ambulatory Visit (INDEPENDENT_AMBULATORY_CARE_PROVIDER_SITE_OTHER): Payer: Medicaid Other | Admitting: Surgery

## 2020-02-07 DIAGNOSIS — M654 Radial styloid tenosynovitis [de Quervain]: Secondary | ICD-10-CM

## 2020-02-07 DIAGNOSIS — M79642 Pain in left hand: Secondary | ICD-10-CM

## 2020-02-07 NOTE — Progress Notes (Signed)
Office Visit Note   Patient: Patricia Ramsey           Date of Birth: 02-23-1966           MRN: [id number] Visit Date: 02/07/2020              Requested by: Lucianne Lei, [address] [place],  [place] [address] PCP: Lucianne Lei, MD   Assessment & Plan: Visit Diagnoses:  1. Pain of left hand   2. De Quervain's tenosynovitis, left     Plan: At this point will recommend conservative treatment. Patient will get over-the-counter Voltaren gel and apply it to the radial wrist twice daily. She is also given a removable thumb spica splint to use when she is sleeping and also when she is out during the day. I did encourage her to work gentle range of motion of her thumb so it does not get more stiff. Follow-up with me in 1 week for recheck. If she continues to be symptomatic I did discuss doing a Marcaine/Depo-Medrol first dorsal compartment injection at that time. Patient states that she has had injections in this area before another office a couple years ago that did help.  Follow-Up Instructions: Return in about 1 week (around 02/14/2020) for With [facility] recheck left wrist and possible first dorsal compartment injection.   Orders:  Orders Placed This Encounter  Procedures  . XR Hand Complete Left   No orders of the defined types were placed in this encounter.     Procedures: No procedures performed   Clinical Data: No additional findings.   Subjective: Chief Complaint  Patient presents with  . Left Hand - Pain    HPI 54 year old black female comes in today with complaints of left radial wrist pain and thumb pain. Patient was initially scheduled to see me today for bilateral knee Visco series but she has not been approved yet for this. She has been having radial wrist and thumb pain a little over a week. No injury. Pain when she grips objects, extends and abducts her thumb. States that a couple years ago she did have injections in this area at another office because of  the repetitive work that she had been doing with her job. Was doing fine up until recent onset of symptoms. No complaints of numbness and tingling. No cervical spine issues. Review of Systems No current cardiac pulmonary GI GU issues  Objective: Vital Signs: There were no vitals taken for this visit.  Physical Exam HENT:     Head: Normocephalic.  Eyes:     Extraocular Movements: Extraocular movements intact.     Pupils: Pupils are equal, round, and reactive to light.  Musculoskeletal:     Comments: Left wrist she has good range of motion but with discomfort radial aspect. She is exquisitely tender over the first dorsal compartment. Positive Finkelstein's test. Pain with resisted thumb extension and abduction. Negative Tinel's. Pain with gripping.  Neurological:     Mental Status: She is alert and oriented to person, place, and time.  Psychiatric:        Mood and Affect: Mood normal.     Ortho Exam  Specialty Comments:  No specialty comments available.  Imaging: XR Hand Complete Left  Result Date: 02/07/2020 X-ray left hand shows mild degenerative changes at the first [facility] joint. No acute finding.    PMFS History: Patient Active Problem List   Diagnosis Date Noted  . Eosinophilia 01/18/2020  . Bilateral primary  osteoarthritis of knee 12/16/2018  . Acute non-recurrent sinusitis 05/10/2018  . Bronchitis 05/10/2018  . Acute left-sided low back pain without sciatica 05/10/2018  . Chronic low back pain without sciatica 07/28/2016  . Cough variant asthma vs UACS 06/24/2015  . Morbid obesity due to excess calories ([place]) 02/23/2015  . Dyspnea 02/22/2015  . Acute pulmonary embolism ([place]) 10/09/2014  . HTN (hypertension) 10/09/2014  . Sleep apnea in adult 10/09/2014  . Chest pain    Past Medical History:  Diagnosis Date  . Asthma   . Diabetes ([place])    boarderline, pt stopped Glucophage  . Hypertension    no meds  . Pulmonary embolism ([place])   . Sleep apnea     Family  History  Problem Relation Age of Onset  . Heart failure Mother   . Hypertension Mother     Past Surgical History:  Procedure Laterality Date  . ANKLE SURGERY    . CHONDROPLASTY Right 09/21/2014   Procedure: CHONDROPLASTY right knee;  Surgeon: [name], MD;  Location: [place];  Service: Orthopedics;  Laterality: Right;  . KNEE ARTHROSCOPY Right 09/21/2014   Procedure:  right knee arthroscopy;  Surgeon: [name], MD;  Location: [place];  Service: Orthopedics;  Laterality: Right;  . LITHOTRIPSY    . oophrectomy    . TUBAL LIGATION     Social History   Occupational History  . Occupation: unemployed  Tobacco Use  . Smoking status: Never Smoker  . Smokeless tobacco: Never Used  Vaping Use  . Vaping Use: Never used  Substance and Sexual Activity  . Alcohol use: No  . Drug use: No  . Sexual activity: Not on file

## 2020-02-12 ENCOUNTER — Telehealth: Payer: Self-pay

## 2020-02-12 NOTE — Telephone Encounter (Signed)
Called to check the status of [employer] application and was advised that application is still processing per [facility] and that the turn around time has changed due to staffing.

## 2020-02-14 ENCOUNTER — Ambulatory Visit: Payer: Medicaid Other | Admitting: Surgery

## 2020-02-14 ENCOUNTER — Encounter: Payer: Self-pay | Admitting: Emergency Medicine

## 2020-02-14 ENCOUNTER — Other Ambulatory Visit: Payer: Self-pay

## 2020-02-14 ENCOUNTER — Ambulatory Visit
Admission: EM | Admit: 2020-02-14 | Discharge: 2020-02-14 | Disposition: A | Discharge status: Home / Self Care | Payer: Medicaid Other | Attending: Emergency Medicine | Admitting: Emergency Medicine

## 2020-02-14 DIAGNOSIS — N23 Unspecified renal colic: Secondary | ICD-10-CM | POA: Diagnosis not present

## 2020-02-14 DIAGNOSIS — R110 Nausea: Secondary | ICD-10-CM | POA: Diagnosis not present

## 2020-02-14 DIAGNOSIS — R109 Unspecified abdominal pain: Secondary | ICD-10-CM | POA: Diagnosis not present

## 2020-02-14 LAB — POCT URINALYSIS DIP (MANUAL ENTRY)
Bilirubin, UA: NEGATIVE
Glucose, UA: NEGATIVE mg/dL
Ketones, POC UA: NEGATIVE mg/dL
Leukocytes, UA: NEGATIVE
Nitrite, UA: NEGATIVE
Protein Ur, POC: NEGATIVE mg/dL
Spec Grav, UA: 1.025 (ref 1.010–1.025)
Urobilinogen, UA: 0.2 E.U./dL
pH, UA: 6 (ref 5.0–8.0)

## 2020-02-14 MED ORDER — ONDANSETRON 4 MG PO TBDP: 4.0000 mg | ORAL_TABLET | Freq: Three times a day (TID) | ORAL | 0 refills | Status: DC | PRN

## 2020-02-14 MED ORDER — KETOROLAC TROMETHAMINE 10 MG PO TABS: 10.0000 mg | ORAL_TABLET | Freq: Four times a day (QID) | ORAL | 0 refills | Status: DC | PRN

## 2020-02-14 NOTE — ED Provider Notes (Signed)
[facility]    CSN: [id number] Arrival date & time: 02/14/20  1507      History   Chief Complaint Chief Complaint  Patient presents with  . Flank Pain    HPI Patricia Ramsey is a 54 y.o. female  Presenting for left renal colic x5 days.  Patient endorsing history of kidney stones: Feels similar.  Has had to undergo lithotripsy in the past.  Having nausea without vomiting.  No fever, vaginal discharge or pain, urinary frequency, urgency.  Able to keep down fluids.  Has not taken anything for symptoms.  Past Medical History:  Diagnosis Date  . Asthma   . Diabetes ([place])    boarderline, pt stopped Glucophage  . Hypertension    no meds  . Pulmonary embolism ([name])   . Sleep apnea     Patient Active Problem List   Diagnosis Date Noted  . Eosinophilia 01/18/2020  . Bilateral primary osteoarthritis of knee 12/16/2018  . Acute non-recurrent sinusitis 05/10/2018  . Bronchitis 05/10/2018  . Acute left-sided low back pain without sciatica 05/10/2018  . Chronic low back pain without sciatica 07/28/2016  . Cough variant asthma vs UACS 06/24/2015  . Morbid obesity due to excess calories ([place]) 02/23/2015  . Dyspnea 02/22/2015  . Acute pulmonary embolism ([place]) 10/09/2014  . HTN (hypertension) 10/09/2014  . Sleep apnea in adult 10/09/2014  . Chest pain     Past Surgical History:  Procedure Laterality Date  . ANKLE SURGERY    . CHONDROPLASTY Right 09/21/2014   Procedure: CHONDROPLASTY right knee;  Surgeon: [name], MD;  Location: [place];  Service: Orthopedics;  Laterality: Right;  . KNEE ARTHROSCOPY Right 09/21/2014   Procedure:  right knee arthroscopy;  Surgeon: [name], MD;  Location: [place];  Service: Orthopedics;  Laterality: Right;  . LITHOTRIPSY    . oophrectomy    . TUBAL LIGATION      OB History   No obstetric history on file.      Home Medications    Prior to Admission medications   Medication Sig  Start Date End Date Taking? Authorizing Provider  albuterol (PROAIR HFA) 108 (90 Base) MCG/ACT inhaler 2 puffs every 4 hours as needed only  if your can't catch your breath 03/16/19   [name], NP  albuterol (PROVENTIL) (2.5 MG/3ML) 0.083% nebulizer solution Take 3 mLs (2.5 mg total) by nebulization every 6 (six) hours as needed for wheezing or shortness of breath. 09/28/19   Parrett, Fonnie Mu, NP  ALPRAZolam (XANAX) 0.5 MG tablet Take 0.5 mg by mouth at bedtime. 12/24/16   [provider]  amLODipine (NORVASC) 5 MG tablet Take 5 mg by mouth daily.  07/04/17   [provider]  apixaban (ELIQUIS) 5 MG TABS tablet Take 1 tablet (5 mg total) by mouth 2 (two) times daily. 06/24/15   Tanda Rockers, MD  famotidine (PEPCID) 20 MG tablet Take 1 tablet (20 mg total) by mouth at bedtime. 11/25/18   Chesley Mires, MD  FLUoxetine (PROZAC) 20 MG capsule Take 20 mg by mouth daily. 01/08/18   [provider]  gabapentin (NEURONTIN) 300 MG capsule Take 1 capsule (300 mg total) by mouth 3 (three) times daily. 10/13/14   Reyne Dumas, MD  hydrochlorothiazide (HYDRODIURIL) 25 MG tablet Take 25 mg by mouth daily. 02/07/15   [provider]  ketorolac (TORADOL) 10 MG tablet Take 1 tablet (10 mg total) by mouth every 6 (six)  hours as needed. 02/14/20   Hall-Potvin, [place], PA-C  Misc. Devices MISC by Does not apply route at bedtime. CPAP machine    [provider]  mometasone (NASONEX) 50 MCG/ACT nasal spray Place 2 sprays into the nose daily as needed. 03/16/19   [name], NP  mometasone-formoterol [facility]) 100-5 MCG/ACT AERO Take 2 puffs first thing in am and then another 2 puffs about 12 hours later. 03/16/19   [name], NP  montelukast (SINGULAIR) 10 MG tablet TAKE 1 TABLET BY MOUTH AT BEDTIME 10/13/19   [name], NP  ondansetron (ZOFRAN ODT) 4 MG disintegrating tablet Take 1 tablet (4 mg total) by mouth every 8 (eight) hours as needed for  nausea or vomiting. 02/14/20   Hall-Potvin, [place], PA-C  OZEMPIC, 1 MG/DOSE, 4 MG/3ML SOPN INJECT 1 MG INTO THE SKIN EVERY WEEK 12/26/19   [provider]  pantoprazole (PROTONIX) 40 MG tablet take 1 tablet by mouth once daily 30 TO 60 MINUTES BEFORE THE FIRST MEAL OF THE DAY 08/01/19   Chesley Mires, MD  rosuvastatin (CRESTOR) 20 MG tablet TK 1 T PO QD 01/08/18   [provider]  sodium chloride (OCEAN) 0.65 % SOLN nasal spray Place 2 sprays into both nostrils as needed for congestion. 05/30/19 02/14/20  [name], NP    Family History Family History  Problem Relation Age of Onset  . Heart failure Mother   . Hypertension Mother     Social History Social History   Tobacco Use  . Smoking status: Never Smoker  . Smokeless tobacco: Never Used  Vaping Use  . Vaping Use: Never used  Substance Use Topics  . Alcohol use: No  . Drug use: No     Allergies   Patient has no known allergies.   Review of Systems As per HPI   Physical Exam Triage Vital Signs ED Triage Vitals [02/14/20 1531]  Enc Vitals Group     BP 107/76     Pulse Rate 83     Resp 16     Temp 98.5 F (36.9 C)     Temp Source Oral     SpO2 95 %     Weight 246 lb (111.6 kg)     Height      Head Circumference      Peak Flow      Pain Score 8     Pain Loc      Pain Edu?      Excl. in [place]?    No data found.  Updated Vital Signs BP 107/76 (BP Location: Left Arm)   Pulse 83   Temp 98.5 F (36.9 C) (Oral)   Resp 16   Wt 246 lb (111.6 kg)   SpO2 95%   BMI 42.23 kg/m   Visual Acuity Right Eye Distance:   Left Eye Distance:   Bilateral Distance:    Right Eye Near:   Left Eye Near:    Bilateral Near:     Physical Exam Constitutional:      General: She is not in acute distress. HENT:     Head: Normocephalic and atraumatic.  Eyes:     General: No scleral icterus.    Pupils: Pupils are equal, round, and reactive to light.  Cardiovascular:     Rate and Rhythm: Normal  rate.  Pulmonary:     Effort: Pulmonary effort is normal.  Abdominal:     General: Bowel sounds are normal.  Palpations: Abdomen is soft.     Tenderness: There is no abdominal tenderness. There is left CVA tenderness. There is no right CVA tenderness or guarding.  Skin:    Coloration: Skin is not jaundiced or pale.  Neurological:     Mental Status: She is alert and oriented to person, place, and time.      UC Treatments / Results  Labs (all labs ordered are listed, but only abnormal results are displayed) Labs Reviewed  POCT URINALYSIS DIP (MANUAL ENTRY) - Abnormal; Notable for the following components:      Result Value   Blood, UA trace-intact (*)    All other components within normal limits    EKG   Radiology No results found.  Procedures Procedures (including critical care time)  Medications Ordered in UC Medications - No data to display  Initial Impression / Assessment and Plan / UC Course  I have reviewed the triage vital signs and the nursing notes.  Pertinent labs & imaging results that were available during my care of the patient were reviewed by me and considered in my medical decision making (see chart for details).     Patient febrile, nontoxic in office today.  Urine dipstick with trace intact blood.  History, likely renal colic with stone.  Will treat supportively as outlined below with low threshold to for further evaluation given obstructive stone in the past.  ER return precautions discussed, pt verbalized understanding and is agreeable to plan. Final Clinical Impressions(s) / UC Diagnoses   Final diagnoses:  Flank pain  Nausea without vomiting  Renal colic on left side     Discharge Instructions     Recommend Toradol and Zofran as needed for pain and nausea. Port to drink plenty of water. Use strainer when urinating. If you catch a stone, please follow-up with your PCP or urology for further evaluation. Go to the ER for worsening pain,  blood in urine, vomiting, fever.    ED Prescriptions    Medication Sig Dispense Auth. Provider   ondansetron (ZOFRAN ODT) 4 MG disintegrating tablet Take 1 tablet (4 mg total) by mouth every 8 (eight) hours as needed for nausea or vomiting. 21 tablet Hall-Potvin, [place], PA-C   ketorolac (TORADOL) 10 MG tablet Take 1 tablet (10 mg total) by mouth every 6 (six) hours as needed. 20 tablet Hall-Potvin, [place], PA-C     PDMP not reviewed this encounter.   Hall-Potvin, [place], [place] 02/14/20 1651

## 2020-02-14 NOTE — Discharge Instructions (Addendum)
Recommend Toradol and Zofran as needed for pain and nausea. Port to drink plenty of water. Use strainer when urinating. If you catch a stone, please follow-up with your PCP or urology for further evaluation. Go to the ER for worsening pain, blood in urine, vomiting, fever.

## 2020-02-14 NOTE — ED Triage Notes (Signed)
For about 5 days has been having left sided flank pain.  Has also been having nausea.

## 2020-02-20 ENCOUNTER — Encounter: Payer: Self-pay | Admitting: Surgery

## 2020-02-20 MED ORDER — BUPIVACAINE HCL 0.25 % IJ SOLN
6.0000 mL | INTRAMUSCULAR | Status: AC | PRN
  Administered 2020-01-17: 6 mL via INTRA_ARTICULAR

## 2020-02-20 MED ORDER — LIDOCAINE HCL 1 % IJ SOLN
3.0000 mL | INTRAMUSCULAR | Status: AC | PRN
  Administered 2020-01-17: 3 mL

## 2020-02-20 MED ORDER — METHYLPREDNISOLONE ACETATE 40 MG/ML IJ SUSP
40.0000 mg | INTRAMUSCULAR | Status: AC | PRN
  Administered 2020-01-17: 40 mg via INTRA_ARTICULAR

## 2020-03-06 ENCOUNTER — Other Ambulatory Visit: Payer: Self-pay

## 2020-03-06 DIAGNOSIS — R0600 Dyspnea, unspecified: Secondary | ICD-10-CM

## 2020-03-06 MED ORDER — PANTOPRAZOLE SODIUM 40 MG PO TBEC: DELAYED_RELEASE_TABLET | ORAL | 5 refills | Status: AC

## 2020-03-11 ENCOUNTER — Telehealth: Payer: Self-pay | Admitting: Surgery

## 2020-03-11 NOTE — Telephone Encounter (Signed)
Noted  

## 2020-03-11 NOTE — Telephone Encounter (Signed)
Pt called stating she was supposed to be getting a gel injection approved and hadn't heard anything back so she would like a CB to update on its status  [phone]

## 2020-03-13 ENCOUNTER — Telehealth: Payer: Self-pay

## 2020-03-13 NOTE — Telephone Encounter (Signed)
See previous message in patient's chart.

## 2020-03-13 NOTE — Telephone Encounter (Signed)
Talked with Keri at [place] to check status and per [facility] application is still processing and would take about 3-5 business.  Called and left a Vm advising patient of message above concerning gel injection.

## 2020-03-14 ENCOUNTER — Ambulatory Visit: Payer: Medicaid Other | Admitting: Surgery

## 2020-03-19 ENCOUNTER — Other Ambulatory Visit: Payer: Self-pay

## 2020-03-19 ENCOUNTER — Encounter: Payer: Self-pay | Admitting: Pulmonary Disease

## 2020-03-19 ENCOUNTER — Ambulatory Visit: Payer: Medicaid Other | Admitting: Pulmonary Disease

## 2020-03-19 VITALS — BP 112/70 | HR 88 | Temp 97.5°F | Ht 63.0 in | Wt 255.8 lb

## 2020-03-19 DIAGNOSIS — G4733 Obstructive sleep apnea (adult) (pediatric): Secondary | ICD-10-CM | POA: Diagnosis not present

## 2020-03-19 DIAGNOSIS — R1319 Other dysphagia: Secondary | ICD-10-CM

## 2020-03-19 DIAGNOSIS — Z23 Encounter for immunization: Secondary | ICD-10-CM | POA: Diagnosis not present

## 2020-03-19 NOTE — Patient Instructions (Signed)
Will arrange for xray of your esophagus and swallowing  Flu shot today  Call in couple weeks to schedule pneumonia vaccine  Will arrange for new water chamber for your CPAP machine  Follow up in 4 months

## 2020-03-19 NOTE — Progress Notes (Signed)
[name]  Chief Complaint  Patient presents with  . Follow-up    shortness of breath with activity(Steps)    Constitutional:  BP 112/70 (BP Location: Left Arm, Cuff Size: Normal)   Pulse 88   Temp (!) 97.5 F (36.4 C) (Other (Comment)) Comment (Src): wrist  Ht 5\' 3"  (1.6 m)   Wt 255 lb 12.8 oz (116 kg)   SpO2 98% Comment: Room air  BMI 45.31 kg/m   Past Medical History:  PE with Rt leg DVT 2016 after knee surgery, HTN, DM, Depression, Anxiety, Pneumonia December 2020  Past Surgical History:  Her  has a past surgical history that includes Tubal ligation; Lithotripsy; oophrectomy; Ankle surgery; Knee arthroscopy (Right, 09/21/2014); and Chondroplasty (Right, 09/21/2014).  Brief Summary:  Patricia Ramsey is a 54 y.o. female with obstructive sleep apnea and cough variant asthma.      Subjective:   She was treated for pneumonia in December 2020.  COVID testing negative x 2.  Treated for sinus infection in April 2021 and asthma exacerbation in July 2021.  She is having trouble with her swallowing.  She feels like food gets stuck and she feels choked.  She is not having sinus congestion, sputum, or wheezing.  Uses CPAP nightly.  No issues with mask fit.  The water chamber for her CPAP broke.  CXR from 05/22/19 showed multilobar pneumonia.  CXR from 12/28/19 was normal.  Physical Exam:   Appearance - well kempt   ENMT - no sinus tenderness, no oral exudate, no LAN, Mallampati 4 airway, no stridor  Respiratory - equal breath sounds bilaterally, no wheezing or rales  CV - s1s2 regular rate and rhythm, no murmurs  Ext - no clubbing, no edema  Skin - no rashes  Psych - normal mood and affect   Pulmonary testing:   Spirometry 11/19/15 >> FEV1 1.74 (78%), FEV1% 79  IgE 12/28/19 >> 55  PFT 01/18/20 >> FEV1 1.78 (83%), FEV1% 82, TLC 4.33 (88%), DLCO 104%  Chest Imaging:   CT angio chest 10/09/14 >> PE with RV:LV ratio  2  CT angio chest 03/01/15 >> PE resolved  Sleep Tests:  PSG 11/22/15 >> AHI 42.6, SpO2 low 52% CPAP 02/18/20 to 03/18/20 >> used on 30 of 30 nights with average 7 hrs 9 min.  Average AHI 0.3 with CPAP 14 cm H2O  Cardiac Tests:   Doppler legs 10/09/14 >> Rt popliteal/peroneal vein DVT  Echo 02/28/15 >> EF 60 to 65%  Social History:  She  reports that she has never smoked. She has never used smokeless tobacco. She reports that she does not drink alcohol and does not use drugs.  Family History:  Her family history includes Heart failure in her mother; Hypertension in her mother.     Assessment/Plan:   Cough variant asthma. - continue dulara, singulair, prn albuterol - influenza vaccine today - she will call to schedule pneumovax in couple of weeks  Obstructive sleep apnea. - she is compliant with CPAP and reports benefit from therapy  - she uses [employer] for her DME - will arrange for replacement CPAP humidifier chamber - continue CPAP 14 cm H2O  Allergic rhinitis. - continue nasonex, singulair  Dysphagia with history of GERD. - this has been persistent - will arrange for esophagram, and based on results determine if she needs further assessment with GI  Obesity. - discussed options to assist with weight loss  Time Spent Involved in Patient Care on  Day of Examination:  37 minutes  Follow up:  Patient Instructions  Will arrange for xray of your esophagus and swallowing  Flu shot today  Call in couple weeks to schedule pneumonia vaccine  Will arrange for new water chamber for your CPAP machine  Follow up in 4 months   Medication List:   Allergies as of 03/19/2020   No Known Allergies     Medication List       Accurate as of March 19, 2020 12:42 PM. If you have any questions, ask your nurse or doctor.        albuterol 108 (90 Base) MCG/ACT inhaler Commonly known as: ProAir HFA 2 puffs every 4 hours as needed only  if your can't catch your breath    albuterol (2.5 MG/3ML) 0.083% nebulizer solution Commonly known as: PROVENTIL Take 3 mLs (2.5 mg total) by nebulization every 6 (six) hours as needed for wheezing or shortness of breath.   ALPRAZolam 0.5 MG tablet Commonly known as: XANAX Take 0.5 mg by mouth at bedtime.   amLODipine 5 MG tablet Commonly known as: NORVASC Take 5 mg by mouth daily.   apixaban 5 MG Tabs tablet Commonly known as: ELIQUIS Take 1 tablet (5 mg total) by mouth 2 (two) times daily.   famotidine 20 MG tablet Commonly known as: PEPCID Take 1 tablet (20 mg total) by mouth at bedtime.   FLUoxetine 20 MG capsule Commonly known as: PROZAC Take 20 mg by mouth daily.   gabapentin 300 MG capsule Commonly known as: NEURONTIN Take 1 capsule (300 mg total) by mouth 3 (three) times daily.   hydrochlorothiazide 25 MG tablet Commonly known as: HYDRODIURIL Take 25 mg by mouth daily.   ketorolac 10 MG tablet Commonly known as: TORADOL Take 1 tablet (10 mg total) by mouth every 6 (six) hours as needed.   Misc. Devices Misc by Does not apply route at bedtime. CPAP machine   mometasone 50 MCG/ACT nasal spray Commonly known as: NASONEX Place 2 sprays into the nose daily as needed.   mometasone-formoterol 100-5 MCG/ACT Aero Commonly known as: DULERA Take 2 puffs first thing in am and then another 2 puffs about 12 hours later.   montelukast 10 MG tablet Commonly known as: SINGULAIR TAKE 1 TABLET BY MOUTH AT BEDTIME   ondansetron 4 MG disintegrating tablet Commonly known as: Zofran ODT Take 1 tablet (4 mg total) by mouth every 8 (eight) hours as needed for nausea or vomiting.   Ozempic (1 MG/DOSE) 4 MG/3ML Sopn Generic drug: Semaglutide (1 MG/DOSE) INJECT 1 MG INTO THE SKIN EVERY WEEK   pantoprazole 40 MG tablet Commonly known as: PROTONIX take 1 tablet by mouth once daily 30 TO 60 MINUTES BEFORE THE FIRST MEAL OF THE DAY   rosuvastatin 20 MG tablet Commonly known as: CRESTOR TK 1 T PO QD        Signature:  Chesley Mires, MD [place] Pager - [phone] 03/19/2020, 12:42 PM

## 2020-03-20 ENCOUNTER — Ambulatory Visit (INDEPENDENT_AMBULATORY_CARE_PROVIDER_SITE_OTHER): Payer: Medicaid Other | Admitting: Surgery

## 2020-03-20 ENCOUNTER — Encounter: Payer: Self-pay | Admitting: Surgery

## 2020-03-20 VITALS — BP 130/91 | HR 79 | Ht 63.0 in | Wt 255.8 lb

## 2020-03-20 DIAGNOSIS — M654 Radial styloid tenosynovitis [de Quervain]: Secondary | ICD-10-CM | POA: Diagnosis not present

## 2020-03-20 NOTE — Progress Notes (Signed)
Office Visit Note   Patient: Patricia Ramsey           Date of Birth: January 21, 1966           MRN: [id number] Visit Date: 03/20/2020              Requested by: Lucianne Lei, [address] [place],  [place] [address] PCP: Lucianne Lei, MD   Assessment & Plan: Visit Diagnoses:  1. De Quervain's tenosynovitis, left     Plan: Today elected to try conservative treatment with injection.  Patient is in the right radial wrist was prepped with Betadine and first dorsal compartment Marcaine/Depo-Medrol injection was performed.  Tolerated without complication.  After sitting for a few minutes patient reported excellent relief with anesthetic in place.  May use ice off and on as needed.  Follow-up in 4 weeks for recheck.  Follow-Up Instructions: Return in about 4 weeks (around 04/17/2020) for recheck wrist.   Orders:  Orders Placed This Encounter  Procedures  . Hand/UE Inj   No orders of the defined types were placed in this encounter.     Procedures: Hand/UE Inj: L extensor compartment 1 for de Quervain's tenosynovitis on 03/20/2020 10:23 AM Indications: pain Details: radial approach Medications: 1 mL bupivacaine 0.5 %; 20 mg methylPREDNISolone acetate 40 MG/ML Outcome: tolerated well, no immediate complications Consent was given by the patient. Patient was prepped and draped in the usual sterile fashion.       Clinical Data: No additional findings.   Subjective: Chief Complaint  Patient presents with  . Left Wrist - Follow-up    HPI 54 year old black female returns for recheck.  Patient was seen by me February 07, 2020 and diagnosed with left de Quervain's tenosynovitis.  She has been trying to manage this conservatively with Voltaren gel and using a removable thumb spica splint without any improvement.  Continues have ongoing pain that is worse with activity.  She is want to proceed with injection that we discussed last office visit. Review of Systems No current  cardiac pulmonary GI GU issues  Objective: Vital Signs: BP (!) 130/91   Pulse 79   Ht 5\' 3"  (1.6 m)   Wt 255 lb 12.8 oz (116 kg)   BMI 45.31 kg/m   Physical Exam Patient has mild swelling over the first dorsal compartment where she is also exquisitely tender.  Positive Finkelstein's test. Ortho Exam  Specialty Comments:  No specialty comments available.  Imaging: No results found.   PMFS History: Patient Active Problem List   Diagnosis Date Noted  . Eosinophilia 01/18/2020  . Bilateral primary osteoarthritis of knee 12/16/2018  . Acute non-recurrent sinusitis 05/10/2018  . Bronchitis 05/10/2018  . Acute left-sided low back pain without sciatica 05/10/2018  . Chronic low back pain without sciatica 07/28/2016  . Cough variant asthma vs UACS 06/24/2015  . Morbid obesity due to excess calories ([place]) 02/23/2015  . Dyspnea 02/22/2015  . Acute pulmonary embolism ([place]) 10/09/2014  . HTN (hypertension) 10/09/2014  . Sleep apnea in adult 10/09/2014  . Chest pain    Past Medical History:  Diagnosis Date  . Asthma   . Diabetes ([place])    boarderline, pt stopped Glucophage  . Hypertension    no meds  . Pulmonary embolism ([place])   . Sleep apnea     Family History  Problem Relation Age of Onset  . Heart failure Mother   . Hypertension Mother     Past Surgical History:  Procedure Laterality Date  . ANKLE SURGERY    . CHONDROPLASTY Right 09/21/2014   Procedure: CHONDROPLASTY right knee;  Surgeon: [name], MD;  Location: [facility];  Service: Orthopedics;  Laterality: Right;  . KNEE ARTHROSCOPY Right 09/21/2014   Procedure:  right knee arthroscopy;  Surgeon: [name], MD;  Location: [facility];  Service: Orthopedics;  Laterality: Right;  . LITHOTRIPSY    . oophrectomy    . TUBAL LIGATION     Social History   Occupational History  . Occupation: unemployed  Tobacco Use  . Smoking status: Never Smoker  . Smokeless tobacco: Never Used   Vaping Use  . Vaping Use: Never used  Substance and Sexual Activity  . Alcohol use: No  . Drug use: No  . Sexual activity: Not on file

## 2020-03-25 ENCOUNTER — Ambulatory Visit (HOSPITAL_COMMUNITY)
Admission: RE | Admit: 2020-03-25 | Discharge: 2020-03-25 | Disposition: A | Discharge status: Home / Self Care | Payer: Medicaid Other | Source: Ambulatory Visit | Attending: Pulmonary Disease | Admitting: Pulmonary Disease

## 2020-03-25 ENCOUNTER — Other Ambulatory Visit: Payer: Self-pay

## 2020-03-25 ENCOUNTER — Other Ambulatory Visit: Payer: Self-pay | Admitting: Pulmonary Disease

## 2020-03-25 DIAGNOSIS — R1319 Other dysphagia: Secondary | ICD-10-CM

## 2020-03-27 ENCOUNTER — Telehealth: Payer: Self-pay | Admitting: Pulmonary Disease

## 2020-03-27 NOTE — Telephone Encounter (Signed)
Called and spoke with patient about xray results per Dr Halford Chessman. All questions answered and patient expressed full understanding of result and Dr Juanetta Gosling recommendation to follow up with PCP if still having trouble with her swallowing. Nothing further needed at this time.

## 2020-03-27 NOTE — Telephone Encounter (Signed)
DG ESOPHAGUS W DOUBLE CM (HD)  Result Date: 03/25/2020 CLINICAL DATA:  Dysphagia. EXAM: ESOPHOGRAM / BARIUM SWALLOW / BARIUM TABLET STUDY TECHNIQUE: Combined double contrast and single contrast examination performed using effervescent crystals, thick barium liquid, and thin barium liquid. The patient was observed with fluoroscopy swallowing a 13 mm barium sulphate tablet. FLUOROSCOPY TIME:  Fluoroscopy Time: 1 minutes and 12 seconds of low-dose pulsed fluoroscopy. Radiation Exposure Index (if provided by the fluoroscopic device): 15.2 mGy Number of Acquired Spot Images: 0 COMPARISON:  Chest CT 03/01/2015. FINDINGS: The esophageal motility is within normal limits. There is no evidence of stricture, mass or ulceration. Rapid sequence imaging of the pharynx in the AP and lateral projections demonstrates no mucosal abnormalities. There is no significant hiatal hernia. No gastroesophageal reflux was elicited with the water siphon test. At the conclusion of the study, a 13 mm barium tablet was administered. This passed without delay into the stomach. IMPRESSION: Normal esophagram. Electronically Signed   By: Richardean Sale M.D.   On: 03/25/2020 09:03     Please let her know that her esophagus xray was normal.  She should follow up with her PCP if she is still having trouble with her swallowing.

## 2020-04-17 ENCOUNTER — Ambulatory Visit: Payer: Medicaid Other | Admitting: Surgery

## 2020-05-13 ENCOUNTER — Other Ambulatory Visit: Payer: Self-pay | Admitting: Primary Care

## 2020-05-13 DIAGNOSIS — J45991 Cough variant asthma: Secondary | ICD-10-CM

## 2020-05-16 ENCOUNTER — Other Ambulatory Visit: Payer: Self-pay | Admitting: Primary Care

## 2020-06-26 ENCOUNTER — Telehealth: Payer: Self-pay

## 2020-06-26 NOTE — Telephone Encounter (Signed)
Received PRF form from [employer] patient assistance. Faxed completed PRF form to [employer] at [phone] for Orthovisc, bilateral knee.

## 2020-07-02 MED ORDER — BUPIVACAINE HCL 0.5 % IJ SOLN
1.0000 mL | INTRAMUSCULAR | Status: AC | PRN
  Administered 2020-03-20: 1 mL

## 2020-07-02 MED ORDER — METHYLPREDNISOLONE ACETATE 40 MG/ML IJ SUSP
20.0000 mg | INTRAMUSCULAR | Status: AC | PRN
  Administered 2020-03-20: 20 mg

## 2020-07-04 ENCOUNTER — Telehealth: Payer: Self-pay

## 2020-07-04 NOTE — Telephone Encounter (Signed)
Called and left a VM advising patient that I have received gel injections from [employer] for Orthovisc, bilateral knee.  Advised patient to call the office to schedule if she would like to proceed.

## 2020-07-09 ENCOUNTER — Ambulatory Visit: Payer: Medicaid Other | Admitting: Surgery

## 2020-07-10 ENCOUNTER — Encounter: Payer: Self-pay | Admitting: Surgery

## 2020-07-10 ENCOUNTER — Ambulatory Visit (INDEPENDENT_AMBULATORY_CARE_PROVIDER_SITE_OTHER): Payer: Medicaid Other | Admitting: Surgery

## 2020-07-10 VITALS — BP 121/84 | HR 73 | Ht 63.0 in | Wt 230.0 lb

## 2020-07-10 DIAGNOSIS — M654 Radial styloid tenosynovitis [de Quervain]: Secondary | ICD-10-CM

## 2020-07-10 DIAGNOSIS — M170 Bilateral primary osteoarthritis of knee: Secondary | ICD-10-CM | POA: Diagnosis not present

## 2020-07-10 DIAGNOSIS — M1711 Unilateral primary osteoarthritis, right knee: Secondary | ICD-10-CM

## 2020-07-10 DIAGNOSIS — M1712 Unilateral primary osteoarthritis, left knee: Secondary | ICD-10-CM

## 2020-07-10 MED ORDER — LIDOCAINE HCL 1 % IJ SOLN
3.0000 mL | INTRAMUSCULAR | Status: AC | PRN
  Administered 2020-07-10: 3 mL

## 2020-07-10 MED ORDER — BUPIVACAINE HCL 0.25 % IJ SOLN
4.0000 mL | INTRAMUSCULAR | Status: AC | PRN
  Administered 2020-07-10: 4 mL via INTRA_ARTICULAR

## 2020-07-10 NOTE — Progress Notes (Signed)
Office Visit Note   Patient: Patricia Ramsey           Date of Birth: December 07, 1965           MRN: [id number] Visit Date: 07/10/2020              Requested by: Lucianne Lei, [address] [place],  [place] [address] PCP: Lucianne Lei, MD   Assessment & Plan: Visit Diagnoses:  1. De Quervain's tenosynovitis, left   2. Arthritis of knee, right   3. Arthritis of left knee     Plan: For patient's left de Quervain's tenosynovitis advised her that I did not want to repeat that injection there today.  She did state that she had fairly good relief up until the last couple weeks.  Advised her that she may need surgical invention with first dorsal compartment release.  She has not tried over-the-counter Voltaren gel and I gave her instructions on how to do this.  She will apply twice daily and see how she does when she returns.  In regards to both knees since there has been such a long delay in getting these approved when requested August 2021 I think it would be best to inject both knees today with Marcaine/betamethasone in hopes of settling down the inflammation.  After patient sent by her knees were prepped with Betadine and intra-articular injections were performed.  I will have patient return back to see me in 4 weeks and I will start Orthovisc series for both knees at that time.  Patient has been working on weight loss and understands that best treatment option for her knees would be total knee replacements.  Follow-Up Instructions: Return in about 4 weeks (around 08/07/2020) for with james to start orthovisc series.   Orders:  No orders of the defined types were placed in this encounter.  No orders of the defined types were placed in this encounter.     Procedures: Large Joint Inj: bilateral knee on 07/10/2020 11:02 AM Indications: pain Details: 25 G 1.5 in needle, anteromedial approach Medications (Right): 3 mL lidocaine 1 %; 4 mL bupivacaine 0.25 % Medications (Left): 3 mL  lidocaine 1 %; 4 mL bupivacaine 0.25 % Outcome: tolerated well, no immediate complications  Bilateral knee Marcaine/betamethasone 1 cc injection was performed Consent was given by the patient. Patient was prepped and draped in the usual sterile fashion.       Clinical Data: No additional findings.   Subjective: Chief Complaint  Patient presents with  . Right Knee - Pain    Orthovisc Injection #1  . Left Knee - Pain    Orthovisc Injection #1  . Left Wrist - Pain    HPI 55 year old black female returns for recheck of left de Quervain's tenosynovitis and bilateral knee pain.  I performed left first dose compartment injection March 21, 2020.  States that she had fairly good response up until the last couple weeks.  Still continues to have pain in the radial aspect of her wrist and thumb.  Aggravated with gripping objects.  Patient also has known history of bilateral knee DJD and comes in today for Orthovisc series.  I had seen patient in August 2021 and discussed doing Visco supplementation at that time but we just got it approved.  I have previously discussed with patient that it would ultimately come down her needing definitive treatment with total knee replacements but her BMI was too high.  States that she has been working  on weight loss.  Objective: Vital Signs: BP 121/84   Pulse 73   Ht 5\' 3"  (1.6 m)   Wt 230 lb (104.3 kg)   BMI 40.74 kg/m   Physical Exam HENT:     Head: Normocephalic.  Pulmonary:     Effort: No respiratory distress.  Musculoskeletal:     Comments: Left wrist she is mildly tender over the first dorsal compartment.  Positive Finkelstein's test.  Bilateral knees joint line tender.  Some swelling without large effusions.  Good knee range of motion.  Neurological:     Mental Status: She is alert and oriented to person, place, and time.     Ortho Exam  Specialty Comments:  No specialty comments available.  Imaging: No results found.   PMFS  History: Patient Active Problem List   Diagnosis Date Noted  . Eosinophilia 01/18/2020  . Bilateral primary osteoarthritis of knee 12/16/2018  . Acute non-recurrent sinusitis 05/10/2018  . Bronchitis 05/10/2018  . Acute left-sided low back pain without sciatica 05/10/2018  . Chronic low back pain without sciatica 07/28/2016  . Cough variant asthma vs UACS 06/24/2015  . Morbid obesity due to excess calories ([place]) 02/23/2015  . Dyspnea 02/22/2015  . Acute pulmonary embolism ([place]) 10/09/2014  . HTN (hypertension) 10/09/2014  . Sleep apnea in adult 10/09/2014  . Chest pain    Past Medical History:  Diagnosis Date  . Asthma   . Diabetes ([place])    boarderline, pt stopped Glucophage  . Hypertension    no meds  . Pulmonary embolism ([place])   . Sleep apnea     Family History  Problem Relation Age of Onset  . Heart failure Mother   . Hypertension Mother     Past Surgical History:  Procedure Laterality Date  . ANKLE SURGERY    . CHONDROPLASTY Right 09/21/2014   Procedure: CHONDROPLASTY right knee;  Surgeon: [name], MD;  Location: [facility];  Service: Orthopedics;  Laterality: Right;  . KNEE ARTHROSCOPY Right 09/21/2014   Procedure:  right knee arthroscopy;  Surgeon: [name], MD;  Location: [facility];  Service: Orthopedics;  Laterality: Right;  . LITHOTRIPSY    . oophrectomy    . TUBAL LIGATION     Social History   Occupational History  . Occupation: unemployed  Tobacco Use  . Smoking status: Never Smoker  . Smokeless tobacco: Never Used  Vaping Use  . Vaping Use: Never used  Substance and Sexual Activity  . Alcohol use: No  . Drug use: No  . Sexual activity: Not on file

## 2020-08-07 ENCOUNTER — Encounter: Payer: Self-pay | Admitting: Surgery

## 2020-08-07 ENCOUNTER — Ambulatory Visit (INDEPENDENT_AMBULATORY_CARE_PROVIDER_SITE_OTHER): Payer: Medicaid Other | Admitting: Surgery

## 2020-08-07 VITALS — BP 131/91 | HR 71 | Ht 63.0 in | Wt 230.0 lb

## 2020-08-07 DIAGNOSIS — M654 Radial styloid tenosynovitis [de Quervain]: Secondary | ICD-10-CM | POA: Diagnosis not present

## 2020-08-07 NOTE — Progress Notes (Signed)
Office Visit Note   Patient: Patricia Ramsey           Date of Birth: 03/20/1966           MRN: [id number] Visit Date: 08/07/2020              Requested by: Lucianne Lei, [address] [place],  [place] [address] PCP: Lucianne Lei, MD   Assessment & Plan: Visit Diagnoses:  1. De Quervain's tenosynovitis, left     Plan: Discussed with patient that best treatment option at this point would be surgical invention with left first dorsal compartment release.  Outpatient procedure discussed with patient.  She has failed conservative treatment with previous injection.  Today's to review previous x-rays.  Preop sheet filled out.  We will get medical clearance.  All questions answered.  Follow-Up Instructions: Return for Need return office visit 1 week postop.   Orders:  No orders of the defined types were placed in this encounter.  No orders of the defined types were placed in this encounter.     Procedures: No procedures performed   Clinical Data: No additional findings.   Subjective: Chief Complaint  Patient presents with  . Left Hand - Follow-up    HPI 55 year old black female with history of left de Quervain's tenosynovitis returns.  As previously documented she had first dorsal compartment Marcaine/Depo-Medrol injection performed by me March 20, 2020.  She did have very good temporary improvement with that for a few months.  She did use the over-the-counter Voltaren gel did not have any improvement with this.  She is ready to proceed with scheduling left first dorsal compartment release as we previously discussed.  Continues have ongoing pain radial aspect of her wrist with gripping and thumb movement.  States that she feels like something is "popping".    Objective: Vital Signs: BP (!) 131/91   Pulse 71   Ht 5\' 3"  (1.6 m)   Wt 230 lb (104.3 kg)   BMI 40.74 kg/m   Physical Exam Constitutional:      Appearance: She is obese.  HENT:     Head:  Normocephalic and atraumatic.     Nose: Nose normal.  Eyes:     Extraocular Movements: Extraocular movements intact.  Pulmonary:     Effort: No respiratory distress.  Skin:    Comments: Exam left wrist she is exquisitely tender over the first dorsal compartment.  Positive Finkelstein's test.     Neurological:     Mental Status: She is alert and oriented to person, place, and time.  Psychiatric:        Mood and Affect: Mood normal.      Ortho Exam  Specialty Comments:  No specialty comments available.  Imaging: No results found.   PMFS History: Patient Active Problem List   Diagnosis Date Noted  . Eosinophilia 01/18/2020  . Bilateral primary osteoarthritis of knee 12/16/2018  . Acute non-recurrent sinusitis 05/10/2018  . Bronchitis 05/10/2018  . Acute left-sided low back pain without sciatica 05/10/2018  . Chronic low back pain without sciatica 07/28/2016  . Cough variant asthma vs UACS 06/24/2015  . Morbid obesity due to excess calories ([place]) 02/23/2015  . Dyspnea 02/22/2015  . Acute pulmonary embolism ([facility]) 10/09/2014  . HTN (hypertension) 10/09/2014  . Sleep apnea in adult 10/09/2014  . Chest pain    Past Medical History:  Diagnosis Date  . Asthma   . Diabetes ([facility] [facility])    boarderline, pt stopped  Glucophage  . Hypertension    no meds  . Pulmonary embolism ([place])   . Sleep apnea     Family History  Problem Relation Age of Onset  . Heart failure Mother   . Hypertension Mother     Past Surgical History:  Procedure Laterality Date  . ANKLE SURGERY    . CHONDROPLASTY Right 09/21/2014   Procedure: CHONDROPLASTY right knee;  Surgeon: [name], MD;  Location: [facility];  Service: Orthopedics;  Laterality: Right;  . KNEE ARTHROSCOPY Right 09/21/2014   Procedure:  right knee arthroscopy;  Surgeon: [name], MD;  Location: [facility];  Service: Orthopedics;  Laterality: Right;  . LITHOTRIPSY    . oophrectomy    . TUBAL  LIGATION     Social History   Occupational History  . Occupation: unemployed  Tobacco Use  . Smoking status: Never Smoker  . Smokeless tobacco: Never Used  Vaping Use  . Vaping Use: Never used  Substance and Sexual Activity  . Alcohol use: No  . Drug use: No  . Sexual activity: Not on file

## 2020-08-21 ENCOUNTER — Other Ambulatory Visit: Payer: Self-pay

## 2020-08-22 ENCOUNTER — Encounter (HOSPITAL_BASED_OUTPATIENT_CLINIC_OR_DEPARTMENT_OTHER): Payer: Self-pay | Admitting: Orthopaedic Surgery

## 2020-08-22 ENCOUNTER — Other Ambulatory Visit: Payer: Self-pay

## 2020-08-27 ENCOUNTER — Other Ambulatory Visit (HOSPITAL_COMMUNITY)
Admission: RE | Admit: 2020-08-27 | Discharge: 2020-08-27 | Disposition: A | Discharge status: Home / Self Care | Payer: Medicaid Other | Source: Ambulatory Visit | Attending: Orthopaedic Surgery | Admitting: Orthopaedic Surgery

## 2020-08-27 ENCOUNTER — Encounter (HOSPITAL_BASED_OUTPATIENT_CLINIC_OR_DEPARTMENT_OTHER)
Admission: RE | Admit: 2020-08-27 | Discharge: 2020-08-27 | Disposition: A | Discharge status: Home / Self Care | Payer: Medicaid Other | Source: Ambulatory Visit | Attending: Orthopaedic Surgery | Admitting: Orthopaedic Surgery

## 2020-08-27 ENCOUNTER — Other Ambulatory Visit: Payer: Self-pay

## 2020-08-27 DIAGNOSIS — Z20822 Contact with and (suspected) exposure to covid-19: Secondary | ICD-10-CM | POA: Diagnosis not present

## 2020-08-27 DIAGNOSIS — Z01818 Encounter for other preprocedural examination: Secondary | ICD-10-CM | POA: Insufficient documentation

## 2020-08-27 DIAGNOSIS — Z01812 Encounter for preprocedural laboratory examination: Secondary | ICD-10-CM | POA: Insufficient documentation

## 2020-08-27 LAB — BASIC METABOLIC PANEL
Anion gap: 6 (ref 5–15)
BUN: 7 mg/dL (ref 6–20)
CO2: 30 mmol/L (ref 22–32)
Calcium: 9.2 mg/dL (ref 8.9–10.3)
Chloride: 104 mmol/L (ref 98–111)
Creatinine, Ser: 0.95 mg/dL (ref 0.44–1.00)
GFR, Estimated: >60 mL/min (ref >60)
Glucose, Bld: 87 mg/dL (ref 70–99)
Potassium: 4 mmol/L (ref 3.5–5.1)
Sodium: 140 mmol/L (ref 135–145)

## 2020-08-27 LAB — SARS CORONAVIRUS 2 (TAT 6-24 HRS): SARS Coronavirus 2: NEGATIVE

## 2020-08-27 NOTE — H&P (Signed)
Office Visit Note              Patient: Patricia Ramsey                                       Date of Birth: [birth year]                                                    MRN: [id number] Visit Date: 08/07/2020                                                                     Requested by: Lucianne Lei, [address] [place],  [place] [address] PCP: Lucianne Lei, MD   Assessment & Plan: Visit Diagnoses:  1. De Quervain's tenosynovitis, left     Plan: Discussed with patient that best treatment option at this point would be surgical invention with left first dorsal compartment release.  Outpatient procedure discussed with patient.  She has failed conservative treatment with previous injection.  Today's to review previous x-rays.  Preop sheet filled out.  We will get medical clearance.  All questions answered.  Follow-Up Instructions: Return for Need return office visit 1 week postop.   Orders:  No orders of the defined types were placed in this encounter.  No orders of the defined types were placed in this encounter.     Procedures: No procedures performed   Clinical Data: No additional findings.   Subjective:    Chief Complaint  Patient presents with  . Left Hand - Follow-up    HPI 55 year old black female with history of left de Quervain's tenosynovitis returns.  As previously documented she had first dorsal compartment Marcaine/Depo-Medrol injection performed by me March 20, 2020.  She did have very good temporary improvement with that for a few months.  She did use the over-the-counter Voltaren gel did not have any improvement with this.  She is ready to proceed with scheduling left first dorsal compartment release as we previously discussed.  Continues have ongoing pain radial aspect of her wrist with gripping and thumb movement.  States that she feels like something is "popping".    Objective: Vital Signs: BP (!) 131/91   Pulse  71   Ht 5\' 3"  (1.6 m)   Wt 230 lb (104.3 kg)   BMI 40.74 kg/m   Physical Exam Constitutional:      Appearance: She is obese.  HENT:     Head: Normocephalic and atraumatic.     Nose: Nose normal.  Eyes:     Extraocular Movements: Extraocular movements intact.  Pulmonary:     Effort: No respiratory distress.  Skin:    Comments: Exam left wrist she is exquisitely tender over the first dorsal compartment.  Positive Finkelstein's test.     Neurological:     Mental Status: She is alert and oriented to person, place, and time.  Psychiatric:        Mood and Affect: Mood normal.  Ortho Exam  Specialty Comments:  No specialty comments available.  Imaging: No results found.   PMFS History:     Patient Active Problem List   Diagnosis Date Noted  . Eosinophilia 01/18/2020  . Bilateral primary osteoarthritis of knee 12/16/2018  . Acute non-recurrent sinusitis 05/10/2018  . Bronchitis 05/10/2018  . Acute left-sided low back pain without sciatica 05/10/2018  . Chronic low back pain without sciatica 07/28/2016  . Cough variant asthma vs UACS 06/24/2015  . Morbid obesity due to excess calories ([place]) 02/23/2015  . Dyspnea 02/22/2015  . Acute pulmonary embolism ([facility]) 10/09/2014  . HTN (hypertension) 10/09/2014  . Sleep apnea in adult 10/09/2014  . Chest pain        Past Medical History:  Diagnosis Date  . Asthma   . Diabetes ([facility])    boarderline, pt stopped Glucophage  . Hypertension    no meds  . Pulmonary embolism ([place])   . Sleep apnea          Family History  Problem Relation Age of Onset  . Heart failure Mother   . Hypertension Mother          Past Surgical History:  Procedure Laterality Date  . ANKLE SURGERY    . CHONDROPLASTY Right 09/21/2014   Procedure: CHONDROPLASTY right knee;  Surgeon: [name], MD;  Location: [facility];  Service: Orthopedics;  Laterality: Right;  . KNEE ARTHROSCOPY Right 09/21/2014    Procedure:  right knee arthroscopy;  Surgeon: [name], MD;  Location: [facility];  Service: Orthopedics;  Laterality: Right;  . LITHOTRIPSY    . oophrectomy    . TUBAL LIGATION     Social History       Occupational History  . Occupation: unemployed  Tobacco Use  . Smoking status: Never Smoker  . Smokeless tobacco: Never Used  Vaping Use  . Vaping Use: Never used  Substance and Sexual Activity  . Alcohol use: No  . Drug use: No  . Sexual activity: Not on file             Instructions     Return for Need return office visit 1 week postop.   After Visit Summary (Automatic SnapShot taken 08/07/2020

## 2020-08-29 NOTE — Anesthesia Preprocedure Evaluation (Addendum)
Anesthesia Evaluation  Patient identified by MRN, date of birth, ID band Patient awake    Reviewed: Allergy & Precautions, NPO status , Patient's Chart, lab work & pertinent test results  History of Anesthesia Complications Negative for: history of anesthetic complications  Airway Mallampati: III  TM Distance: >3 FB Neck ROM: Full    Dental  (+) Missing,    Pulmonary asthma , sleep apnea and Continuous Positive Airway Pressure Ventilation , PE (2016, on Eliquis)   Pulmonary exam normal        Cardiovascular hypertension, Pt. on medications Normal cardiovascular exam     Neuro/Psych negative neurological ROS     GI/Hepatic Neg liver ROS, GERD  Medicated and Controlled,  Endo/Other  diabetes, Type 2Morbid obesity  Renal/GU negative Renal ROS  negative genitourinary   Musculoskeletal  (+) Arthritis ,   Abdominal   Peds  Hematology negative hematology ROS (+)   Anesthesia Other Findings Left De Quervain's tenosynovitis  Reproductive/Obstetrics negative OB ROS                            Anesthesia Physical Anesthesia Plan  ASA: III  Anesthesia Plan: General   Post-op Pain Management:    Induction: Intravenous  PONV Risk Score and Plan: 3 and Treatment may vary due to age or medical condition, Ondansetron, Dexamethasone and Midazolam  Airway Management Planned: LMA  Additional Equipment: None  Intra-op Plan:   Post-operative Plan: Extubation in OR  Informed Consent: I have reviewed the patients History and Physical, chart, labs and discussed the procedure including the risks, benefits and alternatives for the proposed anesthesia with the patient or authorized representative who has indicated his/her understanding and acceptance.     Dental advisory given  Plan Discussed with: CRNA  Anesthesia Plan Comments:        Anesthesia Quick Evaluation

## 2020-08-30 ENCOUNTER — Ambulatory Visit (HOSPITAL_BASED_OUTPATIENT_CLINIC_OR_DEPARTMENT_OTHER): Payer: Medicaid Other | Admitting: Anesthesiology

## 2020-08-30 ENCOUNTER — Other Ambulatory Visit: Payer: Self-pay

## 2020-08-30 ENCOUNTER — Encounter (HOSPITAL_BASED_OUTPATIENT_CLINIC_OR_DEPARTMENT_OTHER)
Admission: RE | Disposition: A | Discharge status: Home / Self Care | Payer: Self-pay | Source: Ambulatory Visit | Attending: Orthopaedic Surgery

## 2020-08-30 ENCOUNTER — Ambulatory Visit (HOSPITAL_BASED_OUTPATIENT_CLINIC_OR_DEPARTMENT_OTHER)
Admission: RE | Admit: 2020-08-30 | Discharge: 2020-08-30 | Disposition: A | Discharge status: Home / Self Care | Payer: Medicaid Other | Source: Ambulatory Visit | Attending: Orthopaedic Surgery | Admitting: Orthopaedic Surgery

## 2020-08-30 ENCOUNTER — Encounter (HOSPITAL_BASED_OUTPATIENT_CLINIC_OR_DEPARTMENT_OTHER): Payer: Self-pay | Admitting: Orthopaedic Surgery

## 2020-08-30 DIAGNOSIS — Z86711 Personal history of pulmonary embolism: Secondary | ICD-10-CM | POA: Diagnosis not present

## 2020-08-30 DIAGNOSIS — M654 Radial styloid tenosynovitis [de Quervain]: Secondary | ICD-10-CM | POA: Diagnosis present

## 2020-08-30 DIAGNOSIS — Z8249 Family history of ischemic heart disease and other diseases of the circulatory system: Secondary | ICD-10-CM | POA: Diagnosis not present

## 2020-08-30 HISTORY — PX: DORSAL COMPARTMENT RELEASE: SHX5039

## 2020-08-30 HISTORY — DX: Gastro-esophageal reflux disease without esophagitis: K21.9

## 2020-08-30 LAB — GLUCOSE, CAPILLARY
Glucose-Capillary: 99 mg/dL (ref 70–99)
Glucose-Capillary: 91 mg/dL (ref 70–99)

## 2020-08-30 SURGERY — RELEASE, FIRST DORSAL COMPARTMENT, HAND
Anesthesia: General | Site: Wrist | Laterality: Left

## 2020-08-30 MED ORDER — HYDROCODONE-ACETAMINOPHEN 5-325 MG PO TABS
1.0000 | ORAL_TABLET | ORAL | 0 refills | Status: DC | PRN
Start: 2020-08-30 — End: 2020-08-30

## 2020-08-30 MED ORDER — LIDOCAINE HCL (CARDIAC) PF 100 MG/5ML IV SOSY
PREFILLED_SYRINGE | INTRAVENOUS | Status: DC | PRN
  Administered 2020-08-30: 30 mg via INTRAVENOUS

## 2020-08-30 MED ORDER — DEXAMETHASONE SODIUM PHOSPHATE 10 MG/ML IJ SOLN
INTRAMUSCULAR | Status: AC
  Filled 2020-08-30: qty 1

## 2020-08-30 MED ORDER — DEXAMETHASONE SODIUM PHOSPHATE 10 MG/ML IJ SOLN
INTRAMUSCULAR | Status: DC | PRN
  Administered 2020-08-30: 5 mg via INTRAVENOUS
  Administered 2020-08-30: 10 mg via INTRAVENOUS

## 2020-08-30 MED ORDER — LACTATED RINGERS IV SOLN: INTRAVENOUS | Status: DC | PRN

## 2020-08-30 MED ORDER — FENTANYL CITRATE (PF) 100 MCG/2ML IJ SOLN
INTRAMUSCULAR | Status: AC
  Filled 2020-08-30: qty 2

## 2020-08-30 MED ORDER — OXYCODONE HCL 5 MG/5ML PO SOLN
5.0000 mg | Freq: Once | ORAL | Status: AC | PRN
Start: 2020-08-30 — End: 2020-08-30

## 2020-08-30 MED ORDER — PROMETHAZINE HCL 25 MG/ML IJ SOLN
6.2500 mg | INTRAMUSCULAR | Status: DC | PRN
Start: 2020-08-30 — End: 2020-08-30

## 2020-08-30 MED ORDER — FENTANYL CITRATE (PF) 100 MCG/2ML IJ SOLN
INTRAMUSCULAR | Status: DC | PRN
  Administered 2020-08-30: 50 ug via INTRAVENOUS

## 2020-08-30 MED ORDER — ONDANSETRON HCL 4 MG/2ML IJ SOLN
INTRAMUSCULAR | Status: AC
  Filled 2020-08-30: qty 2

## 2020-08-30 MED ORDER — MIDAZOLAM HCL 2 MG/2ML IJ SOLN
INTRAMUSCULAR | Status: DC | PRN
  Administered 2020-08-30: 1 mg via INTRAVENOUS

## 2020-08-30 MED ORDER — PROPOFOL 10 MG/ML IV BOLUS
INTRAVENOUS | Status: AC
  Filled 2020-08-30: qty 20

## 2020-08-30 MED ORDER — PROPOFOL 10 MG/ML IV BOLUS
INTRAVENOUS | Status: DC | PRN
  Administered 2020-08-30: 250 mg via INTRAVENOUS

## 2020-08-30 MED ORDER — HYDROCODONE-ACETAMINOPHEN 5-325 MG PO TABS: 1.0000 | ORAL_TABLET | ORAL | 0 refills | Status: AC | PRN

## 2020-08-30 MED ORDER — PHENYLEPHRINE HCL (PRESSORS) 10 MG/ML IV SOLN
INTRAVENOUS | Status: DC | PRN
  Administered 2020-08-30 (×2): 200 ug via INTRAVENOUS

## 2020-08-30 MED ORDER — BUPIVACAINE HCL (PF) 0.25 % IJ SOLN
INTRAMUSCULAR | Status: DC | PRN
  Administered 2020-08-30: 5 mL

## 2020-08-30 MED ORDER — CEFAZOLIN SODIUM-DEXTROSE 2-4 GM/100ML-% IV SOLN
INTRAVENOUS | Status: AC
  Filled 2020-08-30: qty 100

## 2020-08-30 MED ORDER — LACTATED RINGERS IV SOLN: INTRAVENOUS | Status: DC

## 2020-08-30 MED ORDER — CEFAZOLIN SODIUM-DEXTROSE 2-4 GM/100ML-% IV SOLN: 2.0000 g | INTRAVENOUS | Status: DC

## 2020-08-30 MED ORDER — CEFAZOLIN SODIUM-DEXTROSE 2-3 GM-%(50ML) IV SOLR
INTRAVENOUS | Status: DC | PRN
  Administered 2020-08-30: 2 g via INTRAVENOUS

## 2020-08-30 MED ORDER — OXYCODONE HCL 5 MG PO TABS
ORAL_TABLET | ORAL | Status: AC
  Filled 2020-08-30: qty 1

## 2020-08-30 MED ORDER — MIDAZOLAM HCL 2 MG/2ML IJ SOLN
INTRAMUSCULAR | Status: AC
  Filled 2020-08-30: qty 2

## 2020-08-30 MED ORDER — OXYCODONE HCL 5 MG PO TABS
5.0000 mg | ORAL_TABLET | Freq: Once | ORAL | Status: AC | PRN
  Administered 2020-08-30: 5 mg via ORAL

## 2020-08-30 MED ORDER — FENTANYL CITRATE (PF) 100 MCG/2ML IJ SOLN
25.0000 ug | INTRAMUSCULAR | Status: DC | PRN
  Administered 2020-08-30: 25 ug via INTRAVENOUS

## 2020-08-30 MED ORDER — ACETAMINOPHEN 500 MG PO TABS
1000.0000 mg | ORAL_TABLET | Freq: Once | ORAL | Status: AC
  Administered 2020-08-30: 1000 mg via ORAL

## 2020-08-30 MED ORDER — BUPIVACAINE HCL (PF) 0.25 % IJ SOLN
INTRAMUSCULAR | Status: AC
  Filled 2020-08-30: qty 30

## 2020-08-30 MED ORDER — ONDANSETRON HCL 4 MG/2ML IJ SOLN
INTRAMUSCULAR | Status: DC | PRN
  Administered 2020-08-30: 4 mg via INTRAVENOUS

## 2020-08-30 MED ORDER — ACETAMINOPHEN 500 MG PO TABS
ORAL_TABLET | ORAL | Status: AC
  Filled 2020-08-30: qty 2

## 2020-08-30 SURGICAL SUPPLY — 50 items
APL SKNCLS STERI-STRIP NONHPOA (GAUZE/BANDAGES/DRESSINGS) ×1
BENZOIN TINCTURE PRP APPL 2/3 (GAUZE/BANDAGES/DRESSINGS) ×1 IMPLANT
BLADE SURG 15 STRL LF DISP TIS (BLADE) ×1 IMPLANT
BLADE SURG 15 STRL SS (BLADE) ×2
BNDG CMPR 9X4 STRL LF SNTH (GAUZE/BANDAGES/DRESSINGS) ×1
BNDG COHESIVE 3X5 TAN STRL LF (GAUZE/BANDAGES/DRESSINGS) IMPLANT
BNDG ELASTIC 4X5.8 VLCR STR LF (GAUZE/BANDAGES/DRESSINGS) ×1 IMPLANT
BNDG ESMARK 4X9 LF (GAUZE/BANDAGES/DRESSINGS) ×1 IMPLANT
CORD BIPOLAR FORCEPS 12FT (ELECTRODE) ×2 IMPLANT
COVER BACK TABLE 60X90IN (DRAPES) ×2 IMPLANT
COVER MAYO STAND STRL (DRAPES) ×2 IMPLANT
COVER WAND RF STERILE (DRAPES) IMPLANT
CUFF TOURN SGL QUICK 18X4 (TOURNIQUET CUFF) ×1 IMPLANT
DECANTER SPIKE VIAL GLASS SM (MISCELLANEOUS) IMPLANT
DRAPE EXTREMITY T 121X128X90 (DISPOSABLE) ×2 IMPLANT
DRAPE SURG 17X23 STRL (DRAPES) ×2 IMPLANT
DURAPREP 26ML APPLICATOR (WOUND CARE) ×2 IMPLANT
GAUZE SPONGE 4X4 12PLY STRL (GAUZE/BANDAGES/DRESSINGS) ×2 IMPLANT
GAUZE XEROFORM 1X8 LF (GAUZE/BANDAGES/DRESSINGS) ×1 IMPLANT
GLOVE SRG 8 PF TXTR STRL LF DI (GLOVE) ×1 IMPLANT
GLOVE SURG ENC MOIS LTX SZ7.5 (GLOVE) ×2 IMPLANT
GLOVE SURG POLYISO LF SZ6.5 (GLOVE) ×1 IMPLANT
GLOVE SURG UNDER POLY LF SZ6.5 (GLOVE) ×1 IMPLANT
GLOVE SURG UNDER POLY LF SZ7 (GLOVE) ×1 IMPLANT
GLOVE SURG UNDER POLY LF SZ8 (GLOVE) ×2
GOWN STRL REUS W/ TWL LRG LVL3 (GOWN DISPOSABLE) ×1 IMPLANT
GOWN STRL REUS W/ TWL XL LVL3 (GOWN DISPOSABLE) ×1 IMPLANT
GOWN STRL REUS W/TWL LRG LVL3 (GOWN DISPOSABLE) ×2
GOWN STRL REUS W/TWL XL LVL3 (GOWN DISPOSABLE) ×4
NDL HYPO 25GX1X1/2 BEV (NEEDLE) IMPLANT
NDL HYPO 25X1 1.5 SAFETY (NEEDLE) IMPLANT
NEEDLE HYPO 25GX1X1/2 BEV (NEEDLE) IMPLANT
NEEDLE HYPO 25X1 1.5 SAFETY (NEEDLE) ×2 IMPLANT
NS IRRIG 1000ML POUR BTL (IV SOLUTION) ×2 IMPLANT
PACK BASIN DAY SURGERY FS (CUSTOM PROCEDURE TRAY) ×2 IMPLANT
PAD CAST 3X4 CTTN HI CHSV (CAST SUPPLIES) ×1 IMPLANT
PADDING CAST ABS 4INX4YD NS (CAST SUPPLIES) ×1
PADDING CAST ABS COTTON 4X4 ST (CAST SUPPLIES) ×1 IMPLANT
PADDING CAST COTTON 3X4 STRL (CAST SUPPLIES) ×2
SPLINT FIBERGLASS 3X35 (CAST SUPPLIES) ×1 IMPLANT
STOCKINETTE 4X48 STRL (DRAPES) ×2 IMPLANT
STRIP CLOSURE SKIN 1/2X4 (GAUZE/BANDAGES/DRESSINGS) ×1 IMPLANT
SUT ETHILON 4 0 PS 2 18 (SUTURE) ×2 IMPLANT
SUT VIC AB 3-0 SH 27 (SUTURE) ×2
SUT VIC AB 3-0 SH 27X BRD (SUTURE) IMPLANT
SUT VIC AB 4-0 P2 18 (SUTURE) IMPLANT
SYR BULB EAR ULCER 3OZ GRN STR (SYRINGE) ×2 IMPLANT
SYR CONTROL 10ML LL (SYRINGE) ×1 IMPLANT
TOWEL GREEN STERILE FF (TOWEL DISPOSABLE) ×3 IMPLANT
UNDERPAD 30X36 HEAVY ABSORB (UNDERPADS AND DIAPERS) ×2 IMPLANT

## 2020-08-30 NOTE — Anesthesia Procedure Notes (Signed)
Procedure Name: LMA Insertion Performed by: Verita Lamb, CRNA Pre-anesthesia Checklist: Patient identified, Emergency Drugs available, Suction available, Patient being monitored and Timeout performed Patient Re-evaluated:Patient Re-evaluated prior to induction Oxygen Delivery Method: Circle system utilized Preoxygenation: Pre-oxygenation with 100% oxygen Induction Type: IV induction LMA: LMA inserted LMA Size: 4.0 Number of attempts: 1 Airway Equipment and Method: Bite block Placement Confirmation: ETT inserted through vocal cords under direct vision,  positive ETCO2,  breath sounds checked- equal and bilateral and CO2 detector Tube secured with: Tape Dental Injury: Teeth and Oropharynx as per pre-operative assessment

## 2020-08-30 NOTE — Discharge Instructions (Signed)
Keep the splint dry and intact.  You can put a bag over your arm and keep your hand elevated like you are asking question while you take a shower.  You can secure the bacteria upper arm with tape.  Ice can be used over the incision site for the first few days to decrease pain.  Pain medication has been sent into your pharmacy.  See Dr. Lorin Mercy in 1 to 2 weeks.  May take Tylenol after 4pm, if needed.    Post Anesthesia Home Care Instructions  Activity: Get plenty of rest for the remainder of the day. A responsible individual must stay with you for 24 hours following the procedure.  For the next 24 hours, DO NOT: -Drive a car -Paediatric nurse -Drink alcoholic beverages -Take any medication unless instructed by your physician -Make any legal decisions or sign important papers.  Meals: Start with liquid foods such as gelatin or soup. Progress to regular foods as tolerated. Avoid greasy, spicy, heavy foods. If nausea and/or vomiting occur, drink only clear liquids until the nausea and/or vomiting subsides. Call your physician if vomiting continues.  Special Instructions/Symptoms: Your throat may feel dry or sore from the anesthesia or the breathing tube placed in your throat during surgery. If this causes discomfort, gargle with warm salt water. The discomfort should disappear within 24 hours.  If you had a scopolamine patch placed behind your ear for the management of post- operative nausea and/or vomiting:  1. The medication in the patch is effective for 72 hours, after which it should be removed.  Wrap patch in a tissue and discard in the trash. Wash hands thoroughly with soap and water. 2. You may remove the patch earlier than 72 hours if you experience unpleasant side effects which may include dry mouth, dizziness or visual disturbances. 3. Avoid touching the patch. Wash your hands with soap and water after contact with the patch.

## 2020-08-30 NOTE — Anesthesia Postprocedure Evaluation (Signed)
Anesthesia Post Note  Patient: Elis Rawlinson  Procedure(s) Performed: LEFT FIRST DORSAL COMPARTMENT (DEQUERVAIN RELEASE (Left Wrist)     Patient location during evaluation: Phase II Anesthesia Type: General Level of consciousness: awake and alert, patient cooperative and oriented Pain management: pain level controlled Vital Signs Assessment: post-procedure vital signs reviewed and stable Respiratory status: spontaneous breathing, nonlabored ventilation and respiratory function stable Cardiovascular status: blood pressure returned to baseline and stable Postop Assessment: no apparent nausea or vomiting and able to ambulate Anesthetic complications: no   No complications documented.  Last Vitals:  Vitals:   08/30/20 1245 08/30/20 1258  BP: 103/79 97/72  Pulse: 86 79  Resp: 17 12  Temp:    SpO2: 94% 97%    Last Pain:  Vitals:   08/30/20 1252  TempSrc:   PainSc: 5                  Tanyon Alipio,E. Toya Palacios

## 2020-08-30 NOTE — Transfer of Care (Signed)
Immediate Anesthesia Transfer of Care Note  Patient: Patricia Ramsey  Procedure(s) Performed: LEFT FIRST DORSAL COMPARTMENT (DEQUERVAIN RELEASE (Left Wrist)  Patient Location: PACU  Anesthesia Type:General  Level of Consciousness: awake, alert  and oriented  Airway & Oxygen Therapy: Patient Spontanous Breathing and Patient connected to face mask oxygen  Post-op Assessment: Report given to RN and Post -op Vital signs reviewed and stable  Post vital signs: Reviewed and stable  Last Vitals:  Vitals Value Taken Time  BP    Temp    Pulse    Resp    SpO2      Last Pain:  Vitals:   08/30/20 0940  TempSrc: Oral  PainSc: 0-No pain         Complications: No complications documented.

## 2020-08-30 NOTE — Interval H&P Note (Signed)
History and Physical Interval Note:  08/30/2020 11:36 AM  Patricia Ramsey  has presented today for surgery, with the diagnosis of left dequervain's tenosynovitis.  The various methods of treatment have been discussed with the patient and family. After consideration of risks, benefits and other options for treatment, the patient has consented to  Procedure(s): LEFT FIRST DORSAL COMPARTMENT ([place] (Left) as a surgical intervention.  The patient's history has been reviewed, patient examined, no change in status, stable for surgery.  I have reviewed the patient's chart and labs.  Questions were answered to the patient's satisfaction.     [name]

## 2020-08-30 NOTE — Op Note (Signed)
Preop diagnosis: Left first dorsal compartment wrist tenosynovitis (Dequervain's)  Postop diagnosis: Same  Procedure: Left wrist first dorsal compartment release.  Surgeon: Lorin Mercy MD  Tourniquet time less than 20 minutes.  Anesthesia LMA plus Marcaine local.  Procedure after induction of anesthesia preop Ancef prophylaxis 2 g prepping with DuraPrep up to the proximal arm tourniquet extremity sheets and drapes were applied timeout procedure was completed.  Sterile skin marker was used transapical incision and arm was elevated wrapped in Esmarch tourniquet inflated.  Incision was open blunt dissection identification of the radial sensory nerve was performed.  Prominent vein was carefully mobilized and preserved.  First dorsal compartment is open with the 15 blade extended with Stevens scissors and complete release was performed proximally and distally.  Abductor had 2 slips.  Short extensor was separate tendon.  Complete release was confirmed.  Tendon was sequentially pulled to make sure identification was confirmed.  Irrigation with saline solution tourniquet deflation.  3-0 Vicryl subcuticular reapproximation 4-0 Vicryl subcuticular closure tincture benzoin Steri-Strips 4 x 4's web roll and fiberglass thumb splint was applied.  Patient tolerated procedure well transferred care room in stable condition.

## 2020-09-02 ENCOUNTER — Encounter (HOSPITAL_BASED_OUTPATIENT_CLINIC_OR_DEPARTMENT_OTHER): Payer: Self-pay | Admitting: Orthopaedic Surgery

## 2020-09-03 ENCOUNTER — Other Ambulatory Visit (HOSPITAL_COMMUNITY): Payer: Medicaid Other

## 2020-09-04 ENCOUNTER — Ambulatory Visit (INDEPENDENT_AMBULATORY_CARE_PROVIDER_SITE_OTHER): Payer: Medicaid Other | Admitting: Surgery

## 2020-09-04 ENCOUNTER — Encounter: Payer: Self-pay | Admitting: Surgery

## 2020-09-04 ENCOUNTER — Other Ambulatory Visit: Payer: Self-pay

## 2020-09-04 VITALS — Ht 63.0 in | Wt 240.0 lb

## 2020-09-04 DIAGNOSIS — M654 Radial styloid tenosynovitis [de Quervain]: Secondary | ICD-10-CM

## 2020-09-04 NOTE — Progress Notes (Signed)
55 year old black female who is 5 days status post left de Quervain's release returns.  States that she is doing well.  Exam Pleasant black female alert and oriented in no acute distress.  Wound looks good.  New Steri-Strips applied.  No drainage or signs of infection.  Plan Patient was put back into her molded thumb spica splint but she will remove this when she gets home and go into the thumb spica brace that she has.  I encouraged her to work on some gentle range of motion.  Nothing aggressive.  Do not soak her hand.  Follow-up with Dr. Lorin Mercy in 1 week for suture removal.   all questions answered.

## 2020-09-11 ENCOUNTER — Ambulatory Visit (INDEPENDENT_AMBULATORY_CARE_PROVIDER_SITE_OTHER): Payer: Medicaid Other | Admitting: Orthopaedic Surgery

## 2020-09-11 ENCOUNTER — Encounter: Payer: Self-pay | Admitting: Orthopaedic Surgery

## 2020-09-11 VITALS — BP 116/79 | HR 86 | Ht 63.0 in | Wt 240.0 lb

## 2020-09-11 DIAGNOSIS — M654 Radial styloid tenosynovitis [de Quervain]: Secondary | ICD-10-CM

## 2020-09-11 NOTE — Progress Notes (Signed)
    Post-Op Visit Note   Patient: Patricia Ramsey           Date of Birth: Dec 21, 1965           MRN: [id number] Visit Date: 09/11/2020 PCP: Lucianne Lei, MD   Assessment & Plan: Steri-Strip application suture removal.  She has a wrist splint that she can use she can remove it for bathing.  Recheck 1 month.  Mild swelling at the surgery site improvement in her symptoms.  Chief Complaint:  Chief Complaint  Patient presents with  . Left Wrist - Routine Post Op   Visit Diagnoses:  1. Radial styloid tenosynovitis (de quervain)     Plan: Recheck 1 month.  She will use her splint.  She does not work and does not need a note. Recheck one month.   Follow-Up Instructions: Return in about 1 month (around 10/11/2020).   Orders:  No orders of the defined types were placed in this encounter.  No orders of the defined types were placed in this encounter.   Imaging: No results found.  PMFS History: Patient Active Problem List   Diagnosis Date Noted  . Radial styloid tenosynovitis (de quervain) 08/30/2020  . Eosinophilia 01/18/2020  . Bilateral primary osteoarthritis of knee 12/16/2018  . Acute non-recurrent sinusitis 05/10/2018  . Bronchitis 05/10/2018  . Acute left-sided low back pain without sciatica 05/10/2018  . Chronic low back pain without sciatica 07/28/2016  . Cough variant asthma vs UACS 06/24/2015  . Morbid obesity due to excess calories ([place]) 02/23/2015  . Dyspnea 02/22/2015  . Acute pulmonary embolism ([place]) 10/09/2014  . HTN (hypertension) 10/09/2014  . Sleep apnea in adult 10/09/2014  . Chest pain    Past Medical History:  Diagnosis Date  . Asthma   . Diabetes ([place])    boarderline, pt stopped Glucophage  . GERD (gastroesophageal reflux disease)   . Hypertension    no meds  . Pulmonary embolism ([place])   . Sleep apnea     Family History  Problem Relation Age of Onset  . Heart failure Mother   . Hypertension Mother     Past Surgical History:  Procedure  Laterality Date  . ANKLE SURGERY    . CHONDROPLASTY Right 09/21/2014   Procedure: CHONDROPLASTY right knee;  Surgeon: [name], MD;  Location: [facility];  Service: Orthopedics;  Laterality: Right;  . DORSAL COMPARTMENT RELEASE Left 08/30/2020   Procedure: LEFT FIRST DORSAL COMPARTMENT (DEQUERVAIN RELEASE;  Surgeon: [name], MD;  Location: [facility];  Service: Orthopedics;  Laterality: Left;  . KNEE ARTHROSCOPY Right 09/21/2014   Procedure:  right knee arthroscopy;  Surgeon: [name], MD;  Location: [facility];  Service: Orthopedics;  Laterality: Right;  . LITHOTRIPSY    . oophrectomy    . TUBAL LIGATION     Social History   Occupational History  . Occupation: unemployed  Tobacco Use  . Smoking status: Never Smoker  . Smokeless tobacco: Never Used  Vaping Use  . Vaping Use: Never used  Substance and Sexual Activity  . Alcohol use: No  . Drug use: No  . Sexual activity: Not on file

## 2020-10-11 ENCOUNTER — Encounter: Payer: Self-pay | Admitting: Orthopaedic Surgery

## 2020-10-11 ENCOUNTER — Ambulatory Visit (INDEPENDENT_AMBULATORY_CARE_PROVIDER_SITE_OTHER): Payer: Medicaid Other | Admitting: Orthopaedic Surgery

## 2020-10-11 VITALS — BP 123/83 | HR 87

## 2020-10-11 DIAGNOSIS — M170 Bilateral primary osteoarthritis of knee: Secondary | ICD-10-CM

## 2020-10-11 DIAGNOSIS — M545 Low back pain, unspecified: Secondary | ICD-10-CM

## 2020-10-11 DIAGNOSIS — M654 Radial styloid tenosynovitis [de Quervain]: Secondary | ICD-10-CM

## 2020-10-11 NOTE — Progress Notes (Signed)
Post-Op Visit Note   Patient: Patricia Ramsey           Date of Birth: August 13, 1965           MRN: [id number] Visit Date: 10/11/2020 PCP: Lucianne Lei, MD   Assessment & Plan: Post left first dorsal compartment release.  Separate surgical result getting gradual improvement every week.  No further sharp pain or catching.  Problems with her knees has osteoarthritis and needs to lose weight to get down to 222 so that she can consider knee arthroplasty on the right knee which is worse in the left knee.  She also has low back pain and Had previous MRI several years ago which showed narrowing on the left side consistent with her radicular symptoms on the left at the L4-5 level where she has grade 1 anterolisthesis.  She will continue to work on weight loss look at getting into her pool exercise program.  She has exercise bike to right at home.  She is on Eliquis chronically due to acute pulmonary embolism after knee scope in 2016.  She can follow-up if she has increased symptoms needs an intra-articular knee injection.  She understands her goal is weight loss so that she would be a total knee arthroplasty candidate. Chief Complaint:  Chief Complaint  Patient presents with  . Left Wrist - Routine Post Op   Visit Diagnoses:  1. Acute left-sided low back pain without sciatica   2. Bilateral primary osteoarthritis of knee   3. Radial styloid tenosynovitis (de quervain)     Plan: Return as needed  Follow-Up Instructions: No follow-ups on file.   Orders:  No orders of the defined types were placed in this encounter.  No orders of the defined types were placed in this encounter.   Imaging: No results found.  PMFS History: Patient Active Problem List   Diagnosis Date Noted  . Radial styloid tenosynovitis (de quervain) 08/30/2020  . Eosinophilia 01/18/2020  . Bilateral primary osteoarthritis of knee 12/16/2018  . Acute non-recurrent sinusitis 05/10/2018  . Bronchitis 05/10/2018  . Acute  left-sided low back pain without sciatica 05/10/2018  . Chronic low back pain without sciatica 07/28/2016  . Cough variant asthma vs UACS 06/24/2015  . Morbid obesity due to excess calories ([place]) 02/23/2015  . Dyspnea 02/22/2015  . Acute pulmonary embolism ([place]) 10/09/2014  . HTN (hypertension) 10/09/2014  . Sleep apnea in adult 10/09/2014  . Chest pain    Past Medical History:  Diagnosis Date  . Asthma   . Diabetes ([place])    boarderline, pt stopped Glucophage  . GERD (gastroesophageal reflux disease)   . Hypertension    no meds  . Pulmonary embolism ([place])   . Sleep apnea     Family History  Problem Relation Age of Onset  . Heart failure Mother   . Hypertension Mother     Past Surgical History:  Procedure Laterality Date  . ANKLE SURGERY    . CHONDROPLASTY Right 09/21/2014   Procedure: CHONDROPLASTY right knee;  Surgeon: [name], MD;  Location: [facility];  Service: Orthopedics;  Laterality: Right;  . DORSAL COMPARTMENT RELEASE Left 08/30/2020   Procedure: LEFT FIRST DORSAL COMPARTMENT (DEQUERVAIN RELEASE;  Surgeon: [name], MD;  Location: [facility];  Service: Orthopedics;  Laterality: Left;  . KNEE ARTHROSCOPY Right 09/21/2014   Procedure:  right knee arthroscopy;  Surgeon: [name], MD;  Location: [facility];  Service: Orthopedics;  Laterality:  Right;  [place] LITHOTRIPSY    . oophrectomy    . TUBAL LIGATION     Social History   Occupational History  . Occupation: unemployed  Tobacco Use  . Smoking status: Never Smoker  . Smokeless tobacco: Never Used  Vaping Use  . Vaping Use: Never used  Substance and Sexual Activity  . Alcohol use: No  . Drug use: No  . Sexual activity: Not on file

## 2021-02-11 ENCOUNTER — Encounter (HOSPITAL_COMMUNITY): Payer: Self-pay

## 2021-02-11 ENCOUNTER — Other Ambulatory Visit: Payer: Self-pay

## 2021-02-11 ENCOUNTER — Ambulatory Visit (HOSPITAL_COMMUNITY)
Admission: EM | Admit: 2021-02-11 | Discharge: 2021-02-11 | Disposition: A | Discharge status: Home / Self Care | Payer: Medicaid Other | Attending: Student | Admitting: Student

## 2021-02-11 DIAGNOSIS — S29019A Strain of muscle and tendon of unspecified wall of thorax, initial encounter: Secondary | ICD-10-CM

## 2021-02-11 LAB — POCT URINALYSIS DIPSTICK, ED / UC
Bilirubin Urine: NEGATIVE
Glucose, UA: NEGATIVE mg/dL
Hgb urine dipstick: NEGATIVE
Ketones, ur: NEGATIVE mg/dL
Nitrite: NEGATIVE
Protein, ur: NEGATIVE mg/dL
Specific Gravity, Urine: 1.015 (ref 1.005–1.030)
Urobilinogen, UA: 0.2 mg/dL (ref 0.0–1.0)
pH: 6.5 (ref 5.0–8.0)

## 2021-02-11 MED ORDER — TIZANIDINE HCL 2 MG PO TABS: 2.0000 mg | ORAL_TABLET | Freq: Three times a day (TID) | ORAL | 0 refills | Status: AC | PRN

## 2021-02-11 NOTE — ED Triage Notes (Signed)
T reports right sided middle back pain x 1 week. Pain is worse with movement. States "feels a little bit like kidney stones".

## 2021-02-11 NOTE — Discharge Instructions (Addendum)
-  Start the muscle relaxer-Zanaflex (tizanidine), up to 3 times daily for muscle spasms and pain.  This can make you drowsy, so take at bedtime or when you do not need to drive or operate machinery. -You can take Tylenol up to 1000 mg 3 times daily, and ibuprofen up to 600 mg 3 times daily with food.  You can take these together, or alternate every 3-4 hours. -Heating pad

## 2021-02-11 NOTE — ED Provider Notes (Signed)
[facility]    CSN: [id number] Arrival date & time: 02/11/21  1922      History   Chief Complaint Chief Complaint  Patient presents with   Back Pain    HPI Patricia Ramsey is a 55 y.o. female presenting with right-sided back pain for about 1 week, denies trauma or overuse.  Very distant history of nephrolithiasis about 20 years per patient, states she is concerned this is happening again.  Denies absolutely any urinary symptoms including hematuria, dysuria, frequency, urgency.  Has not tried medications for the symptoms. Denies pain shooting down legs, denies numbness in arms/legs, denies weakness in arms/legs, denies saddle anesthesia, denies bowel/bladder incontinence, denies urinary retention, denies constipation. Postmenopausal   HPI  Past Medical History:  Diagnosis Date   Asthma    Diabetes ([place])    boarderline, pt stopped Glucophage   GERD (gastroesophageal reflux disease)    Hypertension    no meds   Pulmonary embolism (HCC)    Sleep apnea     Patient Active Problem List   Diagnosis Date Noted   Radial styloid tenosynovitis (de quervain) 08/30/2020   Eosinophilia 01/18/2020   Bilateral primary osteoarthritis of knee 12/16/2018   Acute non-recurrent sinusitis 05/10/2018   Bronchitis 05/10/2018   Acute left-sided low back pain without sciatica 05/10/2018   Chronic low back pain without sciatica 07/28/2016   Cough variant asthma vs UACS 06/24/2015   Morbid obesity due to excess calories ([place]) 02/23/2015   Dyspnea 02/22/2015   Acute pulmonary embolism ([place]) 10/09/2014   HTN (hypertension) 10/09/2014   Sleep apnea in adult 10/09/2014   Chest pain     Past Surgical History:  Procedure Laterality Date   ANKLE SURGERY     CHONDROPLASTY Right 09/21/2014   Procedure: CHONDROPLASTY right knee;  Surgeon: [name], MD;  Location: [facility];  Service: Orthopedics;  Laterality: Right;   DORSAL COMPARTMENT RELEASE Left 08/30/2020    Procedure: LEFT FIRST DORSAL COMPARTMENT (DEQUERVAIN RELEASE;  Surgeon: [name], MD;  Location: [place];  Service: Orthopedics;  Laterality: Left;   KNEE ARTHROSCOPY Right 09/21/2014   Procedure:  right knee arthroscopy;  Surgeon: [name], MD;  Location: [facility];  Service: Orthopedics;  Laterality: Right;   LITHOTRIPSY     oophrectomy     TUBAL LIGATION      OB History   No obstetric history on file.      Home Medications    Prior to Admission medications   Medication Sig Start Date End Date Taking? Authorizing Provider  tiZANidine (ZANAFLEX) 2 MG tablet Take 1 tablet (2 mg total) by mouth every 8 (eight) hours as needed for muscle spasms. 02/11/21  Yes Hazel Sams, PA-C  albuterol [facility] HFA) [phone] Base) MCG/ACT inhaler 2 puffs every 4 hours as needed only  if your can't catch your breath 03/16/19   [name], NP  albuterol (PROVENTIL) (2.5 MG/3ML) 0.083% nebulizer solution Take 3 mLs (2.5 mg total) by nebulization every 6 (six) hours as needed for wheezing or shortness of breath. 09/28/19   Parrett, Fonnie Mu, NP  ALPRAZolam (XANAX) 0.5 MG tablet Take 0.5 mg by mouth at bedtime. 12/24/16   [provider]  amLODipine (NORVASC) 5 MG tablet Take 5 mg by mouth daily.  07/04/17   [provider]  apixaban (ELIQUIS) 5 MG TABS tablet Take 1 tablet (5 mg total) by mouth 2 (two) times daily. 06/24/15  Tanda Rockers, MD  famotidine (PEPCID) 20 MG tablet Take 1 tablet (20 mg total) by mouth at bedtime. 11/25/18   Chesley Mires, MD  FLUoxetine (PROZAC) 20 MG capsule Take 20 mg by mouth daily. 01/08/18   [provider]  gabapentin (NEURONTIN) 300 MG capsule Take 1 capsule (300 mg total) by mouth 3 (three) times daily. 10/13/14   Reyne Dumas, MD  hydrochlorothiazide (HYDRODIURIL) 25 MG tablet Take 25 mg by mouth daily. 02/07/15   [provider]  HYDROcodone-acetaminophen (NORCO) 5-325 MG tablet Take 1-2  tablets by mouth every 4 (four) hours as needed for moderate pain. 08/30/20   [name], MD  Misc. Devices MISC by Does not apply route at bedtime. CPAP machine    [provider]  mometasone (NASONEX) 50 MCG/ACT nasal spray Place 2 sprays into the nose daily as needed. 03/16/19   [name], NP  mometasone-formoterol (DULERA) 100-5 MCG/ACT AERO INHALE 2 PUFFS BY MOUTH FIRST THING IN THE AM AND THEN ANOTHER 2 PUFFS ABOUT 12 HOURS LATER 05/13/20   Chesley Mires, MD  montelukast (SINGULAIR) 10 MG tablet TAKE 1 TABLET BY MOUTH AT BEDTIME 05/16/20   Chesley Mires, MD  ondansetron (ZOFRAN ODT) 4 MG disintegrating tablet Take 1 tablet (4 mg total) by mouth every 8 (eight) hours as needed for nausea or vomiting. 02/14/20   Hall-Potvin, [place], PA-C  OZEMPIC, 1 MG/DOSE, 4 MG/3ML SOPN INJECT 1 MG INTO THE SKIN EVERY WEEK 12/26/19   [provider]  pantoprazole (PROTONIX) 40 MG tablet take 1 tablet by mouth once daily 30 TO 60 MINUTES BEFORE THE FIRST MEAL OF THE DAY 03/06/20   Chesley Mires, MD  rosuvastatin (CRESTOR) 20 MG tablet TK 1 T PO QD 01/08/18   [provider]  valACYclovir (VALTREX) 1000 MG tablet Take 1,000 mg by mouth 3 (three) times daily. 06/27/20   [provider]  sodium chloride (OCEAN) 0.65 % SOLN nasal spray Place 2 sprays into both nostrils as needed for congestion. 05/30/19 02/14/20  [name], NP    Family History Family History  Problem Relation Age of Onset   Heart failure Mother    Hypertension Mother     Social History Social History   Tobacco Use   Smoking status: Never   Smokeless tobacco: Never  Vaping Use   Vaping Use: Never used  Substance Use Topics   Alcohol use: No   Drug use: No     Allergies   Patient has no known allergies.   Review of Systems Review of Systems  Constitutional:  Negative for chills, fever and unexpected weight change.  Respiratory:  Negative for chest tightness and shortness of  breath.   Cardiovascular:  Negative for chest pain and palpitations.  Gastrointestinal:  Negative for abdominal pain, diarrhea, nausea and vomiting.  Genitourinary:  Negative for decreased urine volume, difficulty urinating and frequency.  Musculoskeletal:  Positive for back pain. Negative for arthralgias, gait problem, joint swelling, myalgias, neck pain and neck stiffness.  Skin:  Negative for wound.  Neurological:  Negative for dizziness, tremors, seizures, syncope, facial asymmetry, speech difficulty, weakness, light-headedness, numbness and headaches.  All other systems reviewed and are negative.   Physical Exam Triage Vital Signs ED Triage Vitals  Enc Vitals Group     BP 02/11/21 1955 (!) 143/94     Pulse Rate 02/11/21 1955 75     Resp 02/11/21 1955 20     Temp 02/11/21 1955 98.5 F (36.9 C)  Temp Source 02/11/21 1955 Oral     SpO2 02/11/21 1955 98 %     Weight --      Height --      Head Circumference --      Peak Flow --      Pain Score 02/11/21 1953 7     Pain Loc --      Pain Edu? --      Excl. in [place]? --    No data found.  Updated Vital Signs BP (!) 143/94 (BP Location: Right Wrist)   Pulse 75   Temp 98.5 F (36.9 C) (Oral)   Resp 20   SpO2 98%   Visual Acuity Right Eye Distance:   Left Eye Distance:   Bilateral Distance:    Right Eye Near:   Left Eye Near:    Bilateral Near:     Physical Exam Vitals reviewed.  Constitutional:      General: She is not in acute distress.    Appearance: Normal appearance. She is not ill-appearing.  HENT:     Head: Normocephalic and atraumatic.  Cardiovascular:     Rate and Rhythm: Normal rate and regular rhythm.     Heart sounds: Normal heart sounds.  Pulmonary:     Effort: Pulmonary effort is normal.     Breath sounds: Normal breath sounds and air entry.  Abdominal:     Tenderness: There is no abdominal tenderness. There is no right CVA tenderness, left CVA tenderness, guarding or rebound.  Musculoskeletal:      Cervical back: Normal range of motion. No swelling, deformity, signs of trauma, rigidity, spasms, tenderness, bony tenderness or crepitus. No pain with movement.     Thoracic back: Spasms and tenderness present. No swelling, deformity, signs of trauma or bony tenderness. Normal range of motion. No scoliosis.     Lumbar back: No swelling, deformity, signs of trauma, spasms, tenderness or bony tenderness. Normal range of motion. Negative right straight leg raise test and negative left straight leg raise test. No scoliosis.     Comments: Right-sided thoracic paraspinous muscle tenderness to palpation, pain elicited with flexion thoracic spine.  Strength and sensation intact upper and lower extremities, no saddle anesthesia.  Gait intact. No midline spinous tenderness, deformity, stepoff.  Absolutely no other injury, deformity, tenderness, ecchymosis, abrasion.  Neurological:     General: No focal deficit present.     Mental Status: She is alert.     Cranial Nerves: No cranial nerve deficit.  Psychiatric:        Mood and Affect: Mood normal.        Behavior: Behavior normal.        Thought Content: Thought content normal.        Judgment: Judgment normal.     UC Treatments / Results  Labs (all labs ordered are listed, but only abnormal results are displayed) Labs Reviewed  POCT URINALYSIS DIPSTICK, ED / UC - Abnormal; Notable for the following components:      Result Value   Leukocytes,Ua TRACE (*)    All other components within normal limits    EKG   Radiology No results found.  Procedures Procedures (including critical care time)  Medications Ordered in UC Medications - No data to display  Initial Impression / Assessment and Plan / UC Course  I have reviewed the triage vital signs and the nursing notes.  Pertinent labs & imaging results that were available during my care of the patient were reviewed by me  and considered in my medical decision making (see chart for  details).     This patient is a very pleasant 55 y.o. year old female presenting with thoracic strain. No red flag symptoms. Postmenopausal.  Distant history nephrolithiasis. UA wnl, did not send culture.  Zanaflex, tylenol/ibuprofen.  Red flag symptoms and ED return precautions discussed. Patient verbalizes understanding and agreement.    Final Clinical Impressions(s) / UC Diagnoses   Final diagnoses:  Thoracic myofascial strain, initial encounter     Discharge Instructions      -Start the muscle relaxer-Zanaflex (tizanidine), up to 3 times daily for muscle spasms and pain.  This can make you drowsy, so take at bedtime or when you do not need to drive or operate machinery. -You can take Tylenol up to 1000 mg 3 times daily, and ibuprofen up to 600 mg 3 times daily with food.  You can take these together, or alternate every 3-4 hours. -Heating pad      ED Prescriptions     Medication Sig Dispense Auth. Provider   tiZANidine (ZANAFLEX) 2 MG tablet Take 1 tablet (2 mg total) by mouth every 8 (eight) hours as needed for muscle spasms. 21 tablet Hazel Sams, PA-C      PDMP not reviewed this encounter.   Hazel Sams, PA-C 02/11/21 2026

## 2021-04-03 ENCOUNTER — Other Ambulatory Visit: Payer: Self-pay | Admitting: Primary Care

## 2021-05-10 ENCOUNTER — Other Ambulatory Visit: Payer: Self-pay

## 2021-05-10 ENCOUNTER — Encounter: Payer: Self-pay | Admitting: *Deleted

## 2021-05-10 ENCOUNTER — Ambulatory Visit
Admission: EM | Admit: 2021-05-10 | Discharge: 2021-05-10 | Disposition: A | Discharge status: Home / Self Care | Payer: Medicaid Other | Attending: Physician Assistant | Admitting: Physician Assistant

## 2021-05-10 DIAGNOSIS — R42 Dizziness and giddiness: Secondary | ICD-10-CM | POA: Diagnosis present

## 2021-05-10 DIAGNOSIS — R051 Acute cough: Secondary | ICD-10-CM | POA: Insufficient documentation

## 2021-05-10 DIAGNOSIS — J029 Acute pharyngitis, unspecified: Secondary | ICD-10-CM | POA: Insufficient documentation

## 2021-05-10 LAB — POCT INFLUENZA A/B
Influenza A, POC: NEGATIVE
Influenza B, POC: NEGATIVE

## 2021-05-10 LAB — POCT RAPID STREP A (OFFICE): Rapid Strep A Screen: NEGATIVE

## 2021-05-10 MED ORDER — MECLIZINE HCL 25 MG PO TABS: 25.0000 mg | ORAL_TABLET | Freq: Three times a day (TID) | ORAL | 0 refills | Status: DC | PRN

## 2021-05-10 NOTE — ED Triage Notes (Addendum)
C/O cough, congestion, sore throat, HA, chest congestion with cough x 1 wk. Describes dizziness with position changes, especially when going from laying to standing position. No known fevers. Wishes to be checked for strep and flu.

## 2021-05-10 NOTE — ED Provider Notes (Signed)
[facility]    CSN: [id number] Arrival date & time: 05/10/21  0948      History   Chief Complaint Chief Complaint  Patient presents with   Cough   Sore Throat    HPI Patricia Ramsey is a 55 y.o. female.   Patient here today for evaluation of congestion, headache, dizziness that started a week ago.  She reports that she has had some sore throat as well.  She has not had any known fever.  She has tried over-the-counter medication without significant relief.  She reports that dizziness seems to be worse with positional changes.  Requests screening for flu and strep.  The history is provided by the patient.  Cough Associated symptoms: sore throat   Associated symptoms: no chills, no ear pain, no eye discharge, no fever, no shortness of breath and no wheezing   Sore Throat Pertinent negatives include no abdominal pain and no shortness of breath.   Past Medical History:  Diagnosis Date   Asthma    Diabetes ([place])    boarderline, pt stopped Glucophage   GERD (gastroesophageal reflux disease)    Hypertension    no meds   Pulmonary embolism (HCC)    Sleep apnea     Patient Active Problem List   Diagnosis Date Noted   Radial styloid tenosynovitis (de quervain) 08/30/2020   Eosinophilia 01/18/2020   Bilateral primary osteoarthritis of knee 12/16/2018   Acute non-recurrent sinusitis 05/10/2018   Bronchitis 05/10/2018   Acute left-sided low back pain without sciatica 05/10/2018   Chronic low back pain without sciatica 07/28/2016   Cough variant asthma vs UACS 06/24/2015   Morbid obesity due to excess calories ([place]) 02/23/2015   Dyspnea 02/22/2015   Acute pulmonary embolism ([place]) 10/09/2014   HTN (hypertension) 10/09/2014   Sleep apnea in adult 10/09/2014   Chest pain     Past Surgical History:  Procedure Laterality Date   ANKLE SURGERY     CHONDROPLASTY Right 09/21/2014   Procedure: CHONDROPLASTY right knee;  Surgeon: [name], MD;  Location: [place];  Service: Orthopedics;  Laterality: Right;   DORSAL COMPARTMENT RELEASE Left 08/30/2020   Procedure: LEFT FIRST DORSAL COMPARTMENT (DEQUERVAIN RELEASE;  Surgeon: [name], MD;  Location: [place];  Service: Orthopedics;  Laterality: Left;   KNEE ARTHROSCOPY Right 09/21/2014   Procedure:  right knee arthroscopy;  Surgeon: [name], MD;  Location: [place];  Service: Orthopedics;  Laterality: Right;   LITHOTRIPSY     oophrectomy     TUBAL LIGATION      OB History   No obstetric history on file.      Home Medications    Prior to Admission medications   Medication Sig Start Date End Date Taking? Authorizing Provider  albuterol (PROVENTIL) (2.5 MG/3ML) 0.083% nebulizer solution INHALE 3 MLS VIA NEBULIZER EVERY 6 HOURS AS NEEDED FOR WHEEZING AND SHORTNESS OF BREATH 04/08/21  Yes [name], NP  amLODipine (NORVASC) 5 MG tablet Take 5 mg by mouth daily.  07/04/17  Yes [provider]  apixaban (ELIQUIS) 5 MG TABS tablet Take 1 tablet (5 mg total) by mouth 2 (two) times daily. 06/24/15  Yes Tanda Rockers, MD  famotidine (PEPCID) 20 MG tablet Take 1 tablet (20 mg total) by mouth at bedtime. 11/25/18  Yes Chesley Mires, MD  FLUoxetine (PROZAC) 20 MG capsule Take 20 mg by mouth daily. 01/08/18  Yes [provider]  gabapentin (NEURONTIN) 300 MG capsule Take 1 capsule (300 mg total) by mouth 3 (three) times daily. 10/13/14  Yes Reyne Dumas, MD  hydrochlorothiazide (HYDRODIURIL) 25 MG tablet Take 25 mg by mouth daily. 02/07/15  Yes [provider]  meclizine (ANTIVERT) 25 MG tablet Take 1 tablet (25 mg total) by mouth 3 (three) times daily as needed for dizziness. 05/10/21  Yes Francene Finders, PA-C  Misc. Devices MISC by Does not apply route at bedtime. CPAP machine   Yes [provider]  montelukast (SINGULAIR) 10 MG tablet TAKE 1 TABLET BY MOUTH AT BEDTIME 05/16/20  Yes Sood, Vineet, MD  OZEMPIC, 1  MG/DOSE, 4 MG/3ML SOPN INJECT 1 MG INTO THE SKIN EVERY WEEK 12/26/19  Yes [provider]  pantoprazole (PROTONIX) 40 MG tablet take 1 tablet by mouth once daily 30 TO 60 MINUTES BEFORE THE FIRST MEAL OF THE DAY 03/06/20  Yes Chesley Mires, MD  rosuvastatin (CRESTOR) 20 MG tablet TK 1 T PO QD 01/08/18  Yes [provider]  valACYclovir (VALTREX) 1000 MG tablet Take 1,000 mg by mouth 3 (three) times daily. 06/27/20  Yes [provider]  albuterol (PROAIR HFA) 108 (90 Base) MCG/ACT inhaler 2 puffs every 4 hours as needed only  if your can't catch your breath 03/16/19   [name], NP  ALPRAZolam Duanne Moron) 0.5 MG tablet Take 0.5 mg by mouth at bedtime. 12/24/16   [provider]  HYDROcodone-acetaminophen (NORCO) 5-325 MG tablet Take 1-2 tablets by mouth every 4 (four) hours as needed for moderate pain. 08/30/20   [name], MD  mometasone (NASONEX) 50 MCG/ACT nasal spray Place 2 sprays into the nose daily as needed. 03/16/19   [name], NP  mometasone-formoterol (DULERA) 100-5 MCG/ACT AERO INHALE 2 PUFFS BY MOUTH FIRST THING IN THE AM AND THEN ANOTHER 2 PUFFS ABOUT 12 HOURS LATER 05/13/20   Chesley Mires, MD  ondansetron (ZOFRAN ODT) 4 MG disintegrating tablet Take 1 tablet (4 mg total) by mouth every 8 (eight) hours as needed for nausea or vomiting. 02/14/20   Hall-Potvin, [place], PA-C  tiZANidine (ZANAFLEX) 2 MG tablet Take 1 tablet (2 mg total) by mouth every 8 (eight) hours as needed for muscle spasms. 02/11/21   Hazel Sams, PA-C  sodium chloride (OCEAN) 0.65 % SOLN nasal spray Place 2 sprays into both nostrils as needed for congestion. 05/30/19 02/14/20  [name], NP    Family History Family History  Problem Relation Age of Onset   Heart failure Mother    Hypertension Mother     Social History Social History   Tobacco Use   Smoking status: Never   Smokeless tobacco: Never  Vaping Use   Vaping Use: Never used  Substance Use  Topics   Alcohol use: No   Drug use: No     Allergies   Patient has no known allergies.   Review of Systems Review of Systems  Constitutional:  Negative for chills and fever.  HENT:  Positive for congestion, sinus pressure and sore throat. Negative for ear pain.   Eyes:  Negative for discharge and redness.  Respiratory:  Positive for cough. Negative for shortness of breath and wheezing.   Gastrointestinal:  Negative for abdominal pain, diarrhea, nausea and vomiting.  Neurological:  Positive for dizziness.    Physical Exam Triage Vital Signs ED Triage Vitals  Enc Vitals Group     BP 05/10/21 1057 120/83     Pulse Rate 05/10/21 1057  75     Resp 05/10/21 1057 (!) 22     Temp 05/10/21 1057 97.6 F (36.4 C)     Temp Source 05/10/21 1057 Temporal     SpO2 05/10/21 1057 95 %     Weight --      Height --      Head Circumference --      Peak Flow --      Pain Score 05/10/21 1058 5     Pain Loc --      Pain Edu? --      Excl. in [place]? --    No data found.  Updated Vital Signs BP 120/83   Pulse 75   Temp 97.6 F (36.4 C) (Temporal)   Resp (!) 22   SpO2 95%      Physical Exam Vitals and nursing note reviewed.  Constitutional:      General: She is not in acute distress.    Appearance: Normal appearance. She is not ill-appearing.  HENT:     Head: Normocephalic and atraumatic.     Nose: Congestion present.     Mouth/Throat:     Mouth: Mucous membranes are moist.     Pharynx: No oropharyngeal exudate or posterior oropharyngeal erythema.  Eyes:     Conjunctiva/sclera: Conjunctivae normal.  Cardiovascular:     Rate and Rhythm: Normal rate and regular rhythm.     Heart sounds: Normal heart sounds. No murmur heard. Pulmonary:     Effort: Pulmonary effort is normal. No respiratory distress.     Breath sounds: Normal breath sounds. No wheezing, rhonchi or rales.  Skin:    General: Skin is warm and dry.  Neurological:     Mental Status: She is alert.  Psychiatric:         Mood and Affect: Mood normal.        Thought Content: Thought content normal.     UC Treatments / Results  Labs (all labs ordered are listed, but only abnormal results are displayed) Labs Reviewed  CULTURE, GROUP A STREP ([place])  COVID-19, FLU A+B NAA  POCT RAPID STREP A (OFFICE)  POCT INFLUENZA A/B    EKG   Radiology No results found.  Procedures Procedures (including critical care time)  Medications Ordered in UC Medications - No data to display  Initial Impression / Assessment and Plan / UC Course  I have reviewed the triage vital signs and the nursing notes.  Pertinent labs & imaging results that were available during my care of the patient were reviewed by me and considered in my medical decision making (see chart for details).  Suspect likely viral etiology of symptoms. Flu and strep screening negative in office. Will treat suspected vertigo with meclizine.  Discussed Cepacol for sore throat relief.  Recommended she stay hydrated.  Encouraged follow-up with any further concerns.  Final Clinical Impressions(s) / UC Diagnoses   Final diagnoses:  Sore throat  Acute cough  Vertigo     Discharge Instructions       Try  CEPACOL  for sore throat.      ED Prescriptions     Medication Sig Dispense Auth. Provider   meclizine (ANTIVERT) 25 MG tablet Take 1 tablet (25 mg total) by mouth 3 (three) times daily as needed for dizziness. 30 tablet Francene Finders, PA-C      PDMP not reviewed this encounter.   Francene Finders, PA-C 05/10/21 1251

## 2021-05-10 NOTE — Discharge Instructions (Addendum)
  Try  CEPACOL  for sore throat.

## 2021-05-11 LAB — COVID-19, FLU A+B NAA
Influenza A, NAA: NOT DETECTED
Influenza B, NAA: NOT DETECTED
SARS-CoV-2, NAA: NOT DETECTED

## 2021-05-13 LAB — CULTURE, GROUP A STREP (THRC)

## 2021-05-21 ENCOUNTER — Telehealth: Payer: Self-pay | Admitting: Pulmonary Disease

## 2021-05-21 NOTE — Telephone Encounter (Signed)
Fax received from Dr. Michail Sermon with [facility] to perform a Colonoscopy on patient.  Patient needs surgery clearance. Patient was seen on 03/19/2020. Office protocol is a risk assessment can be sent to surgeon if patient has been seen in 60 days or less.   Sending to Dr. Halford Chessman for risk assessment or recommendations if patient needs to be seen in office prior to surgical procedure.    ATC patient to get her scheduled for an OV since its been over a year, LMTCB  When patient calls back please schedule her for OV with Dr. Halford Chessman or APP for clearance.

## 2021-05-21 NOTE — Telephone Encounter (Signed)
Correct.  She has not been seen in office for more than 1 year.  She needs ROV with me or NP to assess status of asthma and sleep apnea prior to commenting on pulmonary fitness to undergo colonoscopy.

## 2021-05-27 NOTE — Telephone Encounter (Signed)
Next Appt With Pulmonology Chesley Mires, MD) 07/09/2021 at 9:00 AM

## 2021-05-27 NOTE — Telephone Encounter (Signed)
ATC patient x2, LMTCB °

## 2021-06-04 ENCOUNTER — Ambulatory Visit: Payer: Medicaid Other | Admitting: Orthopaedic Surgery

## 2021-06-04 ENCOUNTER — Ambulatory Visit (INDEPENDENT_AMBULATORY_CARE_PROVIDER_SITE_OTHER): Payer: Medicaid Other

## 2021-06-04 ENCOUNTER — Ambulatory Visit: Payer: Self-pay

## 2021-06-04 ENCOUNTER — Other Ambulatory Visit: Payer: Self-pay

## 2021-06-04 ENCOUNTER — Encounter: Payer: Self-pay | Admitting: Orthopaedic Surgery

## 2021-06-04 VITALS — BP 105/71 | HR 88 | Ht 63.0 in | Wt 240.0 lb

## 2021-06-04 DIAGNOSIS — G8929 Other chronic pain: Secondary | ICD-10-CM

## 2021-06-04 DIAGNOSIS — M25561 Pain in right knee: Secondary | ICD-10-CM

## 2021-06-04 DIAGNOSIS — M25562 Pain in left knee: Secondary | ICD-10-CM | POA: Diagnosis not present

## 2021-06-04 DIAGNOSIS — M170 Bilateral primary osteoarthritis of knee: Secondary | ICD-10-CM

## 2021-06-04 MED ORDER — METHYLPREDNISOLONE ACETATE 40 MG/ML IJ SUSP
40.0000 mg | INTRAMUSCULAR | Status: AC | PRN
  Administered 2021-06-04: 40 mg via INTRA_ARTICULAR

## 2021-06-04 MED ORDER — BUPIVACAINE HCL 0.25 % IJ SOLN
4.0000 mL | INTRAMUSCULAR | Status: AC | PRN
  Administered 2021-06-04: 4 mL via INTRA_ARTICULAR

## 2021-06-04 MED ORDER — LIDOCAINE HCL 1 % IJ SOLN
0.5000 mL | INTRAMUSCULAR | Status: AC | PRN
  Administered 2021-06-04: .5 mL

## 2021-06-04 NOTE — Progress Notes (Signed)
Office Visit Note   Patient: Patricia Ramsey           Date of Birth: 01-Jun-1966           MRN: [id number] Visit Date: 06/04/2021              Requested by: Lucianne Lei, [address] [place],  [place] [address] PCP: Lucianne Lei, MD   Assessment & Plan: Visit Diagnoses:  1. Chronic pain of both knees   2. Bilateral primary osteoarthritis of knee     Plan: Right knee injection performed.  She is continuing weight loss recheck 3 months.  Follow-Up Instructions: No follow-ups on file.   Orders:  Orders Placed This Encounter  Procedures   XR KNEE 3 VIEW RIGHT   XR KNEE 3 VIEW LEFT   No orders of the defined types were placed in this encounter.     Procedures: Large Joint Inj: R knee on 06/04/2021 11:35 AM Indications: pain and joint swelling Details: 22 G 1.5 in needle, anterolateral approach  Arthrogram: No  Medications: 40 mg methylPREDNISolone acetate 40 MG/ML; 0.5 mL lidocaine 1 %; 4 mL bupivacaine 0.25 % Outcome: tolerated well, no immediate complications Procedure, treatment alternatives, risks and benefits explained, specific risks discussed. Consent was given by the patient. Immediately prior to procedure a time out was called to verify the correct patient, procedure, equipment, support staff and site/side marked as required. Patient was prepped and draped in the usual sterile fashion.      Clinical Data: No additional findings.   Subjective: Chief Complaint  Patient presents with   Right Knee - Pain   Left Knee - Pain    HPI 56 year old female returns for ongoing problems with bilateral knee osteoarthritis worse in the right knee than left knee.  She states her knee has been giving way at times she has to go up stairs 1 step at a time using the rail.  She had gained some weight and they started her on Ozempic and she states she is now losing weight with a goal to get her weight from 240 down to 225 so she can proceed with right total knee  arthroplasty.  Review of Systems of note past history of acute pulmonary embolism.  Previous de Quervain's release right wrist.  Right knee chondroplasty 2016.  All other systems are noncontributory to HPI.   Objective: Vital Signs: BP 105/71    Pulse 88    Ht 5\' 3"  (1.6 m)    Wt 240 lb (108.9 kg)    BMI 42.51 kg/m   Physical Exam Constitutional:      Appearance: She is well-developed.  HENT:     Head: Normocephalic.     Right Ear: External ear normal.     Left Ear: External ear normal. There is no impacted cerumen.  Eyes:     Pupils: Pupils are equal, round, and reactive to light.  Neck:     Thyroid: No thyromegaly.     Trachea: No tracheal deviation.  Cardiovascular:     Rate and Rhythm: Normal rate.  Pulmonary:     Effort: Pulmonary effort is normal.  Abdominal:     Palpations: Abdomen is soft.  Musculoskeletal:     Cervical back: No rigidity.  Skin:    General: Skin is warm and dry.  Neurological:     Mental Status: She is alert and oriented to person, place, and time.  Psychiatric:  Behavior: Behavior normal.    Ortho Exam her medial and lateral joint line tenderness right and left knee.  She is amatory with a right knee limp lacking full extension.  Crepitus with knee range of motion.  She flexes 110 degrees pain along the medial joint line with hyperextension.  Negative apprehension no patellar subluxation.  Positive patellar grind test.  Negative logroll of the hips no pain with internal rotation either hip.  Negative straight leg raising 90 degrees.  Specialty Comments:  No specialty comments available.  Imaging: No results found.   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/30/2020   Eosinophilia 01/18/2020   Bilateral primary osteoarthritis of knee 12/16/2018   Acute non-recurrent sinusitis 05/10/2018   Bronchitis 05/10/2018   Acute left-sided low back pain without sciatica  05/10/2018   Chronic low back pain without sciatica 07/28/2016   Cough variant asthma vs UACS 06/24/2015   Morbid obesity due to excess calories ([place]) 02/23/2015   Dyspnea 02/22/2015   Acute pulmonary embolism ([facility] [facility]) 10/09/2014   HTN (hypertension) 10/09/2014   Sleep apnea in adult 10/09/2014   Chest pain    Past Medical History:  Diagnosis Date   Asthma    Diabetes ([place])    boarderline, pt stopped Glucophage   GERD (gastroesophageal reflux disease)    Hypertension    no meds   Pulmonary embolism ([facility])    Sleep apnea     Family History  Problem Relation Age of Onset   Heart failure Mother    Hypertension Mother     Past Surgical History:  Procedure Laterality Date   ANKLE SURGERY     CHONDROPLASTY Right 09/21/2014   Procedure: CHONDROPLASTY right knee;  Surgeon: [name], MD;  Location: [facility];  Service: Orthopedics;  Laterality: Right;   DORSAL COMPARTMENT RELEASE Left 08/30/2020   Procedure: LEFT FIRST DORSAL COMPARTMENT (DEQUERVAIN RELEASE;  Surgeon: [name], MD;  Location: [facility];  Service: Orthopedics;  Laterality: Left;   KNEE ARTHROSCOPY Right 09/21/2014   Procedure:  right knee arthroscopy;  Surgeon: [name], MD;  Location: [facility];  Service: Orthopedics;  Laterality: Right;   LITHOTRIPSY     oophrectomy     TUBAL LIGATION     Social History   Occupational History   Occupation: unemployed  Tobacco Use   Smoking status: Never   Smokeless tobacco: Never  Vaping Use   Vaping Use: Never used  Substance and Sexual Activity   Alcohol use: No   Drug use: No   Sexual activity: Not on file

## 2021-07-09 ENCOUNTER — Other Ambulatory Visit: Payer: Self-pay

## 2021-07-09 ENCOUNTER — Ambulatory Visit: Payer: Medicaid Other | Admitting: Pulmonary Disease

## 2021-07-09 ENCOUNTER — Encounter: Payer: Self-pay | Admitting: Pulmonary Disease

## 2021-07-09 VITALS — BP 128/82 | HR 68 | Ht 63.0 in | Wt 241.0 lb

## 2021-07-09 DIAGNOSIS — R1319 Other dysphagia: Secondary | ICD-10-CM | POA: Diagnosis not present

## 2021-07-09 DIAGNOSIS — Z01811 Encounter for preprocedural respiratory examination: Secondary | ICD-10-CM | POA: Diagnosis not present

## 2021-07-09 DIAGNOSIS — G4733 Obstructive sleep apnea (adult) (pediatric): Secondary | ICD-10-CM | POA: Diagnosis not present

## 2021-07-09 DIAGNOSIS — J45991 Cough variant asthma: Secondary | ICD-10-CM

## 2021-07-09 MED ORDER — ALBUTEROL SULFATE (2.5 MG/3ML) 0.083% IN NEBU: INHALATION_SOLUTION | RESPIRATORY_TRACT | 6 refills | Status: DC

## 2021-07-09 MED ORDER — DULERA 100-5 MCG/ACT IN AERO: INHALATION_SPRAY | RESPIRATORY_TRACT | 5 refills | Status: DC

## 2021-07-09 MED ORDER — ALBUTEROL SULFATE HFA 108 (90 BASE) MCG/ACT IN AERS: INHALATION_SPRAY | RESPIRATORY_TRACT | 5 refills | Status: DC

## 2021-07-09 MED ORDER — ALBUTEROL SULFATE HFA 108 (90 BASE) MCG/ACT IN AERS: 2.0000 | INHALATION_SPRAY | Freq: Four times a day (QID) | RESPIRATORY_TRACT | 5 refills | Status: DC | PRN

## 2021-07-09 NOTE — Progress Notes (Signed)
Pulmonary, Critical Care, and Sleep Medicine  Chief Complaint  Patient presents with   Follow-up    Surgical clearance for colonoscopy with Dr. Wilford Corner. States she has been doing well since last visit. Denies any current issues.     Constitutional:  BP 128/82    Pulse 68    Ht 5\' 3"  (1.6 m)    Wt 241 lb (109.3 kg)    SpO2 99% Comment: on RA   BMI 42.69 kg/m   Past Medical History:  PE with Rt leg DVT 2016 after knee surgery, HTN, DM, Depression, Anxiety, Pneumonia December 2020  Past Surgical History:  Her  has a past surgical history that includes Tubal ligation; Lithotripsy; oophrectomy; Ankle surgery; Knee arthroscopy (Right, 09/21/2014); Chondroplasty (Right, 09/21/2014); and Dorsal compartment release (Left, 08/30/2020).  Brief Summary:  Patricia Ramsey is a 56 y.o. female with obstructive sleep apnea and cough variant asthma.      Subjective:   She had previous colonoscopy that showed a polyp.  She is scheduled for repeat colonoscopy in March.  She reports that she has trouble swallowing certain foods.  This has been present for the past several months.  Occurs when she eats meats.  Softer foods and liquids don't cause a problem.  She feels like the food gets stuck in her mid chest and she has to force herself to swallow.   She gets winded when walking.  Recovers after resting for a few minutes.  She was approved for disability and is no longer working.  She is not having cough, wheeze, sputum, or chest congestion.  Uses CPAP nightly.  No issues with mask fit or pressure setting.  Physical Exam:   Appearance - well kempt   ENMT - no sinus tenderness, no oral exudate, no LAN, Mallampati 4 airway, no stridor  Respiratory - equal breath sounds bilaterally, no wheezing or rales  CV - s1s2 regular rate and rhythm, no murmurs  Ext - no clubbing, no edema  Skin - no rashes  Psych - normal mood and affect    Pulmonary testing:  Spirometry 11/19/15  >> FEV1 1.74 (78%), FEV1% 79 IgE 12/28/19 >> 55 PFT 01/18/20 >> FEV1 1.78 (83%), FEV1% 82, TLC 4.33 (88%), DLCO 104%  Chest Imaging:  CT angio chest 10/09/14 >> PE with RV:LV ratio 2 CT angio chest 03/01/15 >> PE resolved  Sleep Tests:  PSG 11/22/15 >> AHI 42.6, SpO2 low 52% CPAP 04/09/21 to 07/07/20 >> used on 89 of 90 nights with average 7 hrs 59 min.  Average AHI 0.6 with CPAP 14 cm H2O  Cardiac Tests:  Doppler legs 10/09/14 >> Rt popliteal/peroneal vein DVT Echo 02/28/15 >> EF 60 to 65%  Social History:  She  reports that she has never smoked. She has never used smokeless tobacco. She reports that she does not drink alcohol and does not use drugs.  Family History:  Her family history includes Heart failure in her mother; Hypertension in her mother.     Assessment/Plan:   Cough variant asthma. - continue dulera 100 one puff bid, singulair 10 mg qhs - prn albuterol   Obstructive sleep apnea. - she is compliant with CPAP and reports benefit from therapy  - she uses [employer] for her DME - continue CPAP 14 cm H2O   Allergic rhinitis. - continue singulair, nasonex   Dysphagia with history of GERD. - advised her to discuss with gastroenterology  History of colon polyps. - she is scheduled for colonoscopy  with Dr. Wilford Corner - there are no pulmonary contraindications for her to undergo a colonoscopy - she should have close monitoring of her oxygen levels during and after the procedure - she should resume CPAP therapy at night after her procedure - she should continue her inhaler regimen during the peri-procedure period, and resume singulair when she is able to take pills again after the procedure  Dyspnea on exertion. - most likely from obesity and deconditioning - discussed options to assist with weight loss - encouraged her to maintain a regular exercise regimen  Time Spent Involved in Patient Care on Day of Examination:  38 minutes  Follow up:   Patient  Instructions  It is okay for you to proceed with colonoscopy.  Please call Dr. Kathline Magic office to discuss the issues you are having with your swallowing.  Follow up in 1 year  Medication List:   Allergies as of 07/09/2021   No Known Allergies      Medication List        Accurate as of July 09, 2021  9:41 AM. If you have any questions, ask your nurse or doctor.          albuterol (2.5 MG/3ML) 0.083% nebulizer solution Commonly known as: PROVENTIL INHALE 3 MLS VIA NEBULIZER EVERY 6 HOURS AS NEEDED FOR WHEEZING AND SHORTNESS OF BREATH What changed: Another medication with the same name was changed. Make sure you understand how and when to take each. Changed by: Chesley Mires, MD   albuterol 108 (90 Base) MCG/ACT inhaler Commonly known as: ProAir HFA Inhale 2 puffs into the lungs every 6 (six) hours as needed for wheezing or shortness of breath. 2 puffs every 4 hours as needed only  if your can't catch your breath What changed:  how much to take how to take this when to take this reasons to take this Changed by: Chesley Mires, MD   ALPRAZolam 0.5 MG tablet Commonly known as: XANAX Take 0.5 mg by mouth at bedtime.   amLODipine 5 MG tablet Commonly known as: NORVASC Take 5 mg by mouth daily.   apixaban 5 MG Tabs tablet Commonly known as: ELIQUIS Take 1 tablet (5 mg total) by mouth 2 (two) times daily.   citalopram 20 MG tablet Commonly known as: CELEXA Take 20 mg by mouth daily.   Dulera 100-5 MCG/ACT Aero Generic drug: mometasone-formoterol INHALE 2 PUFFS BY MOUTH FIRST THING IN THE AM AND THEN ANOTHER 2 PUFFS ABOUT 12 HOURS LATER   famotidine 20 MG tablet Commonly known as: PEPCID Take 1 tablet (20 mg total) by mouth at bedtime.   FLUoxetine 20 MG capsule Commonly known as: PROZAC Take 20 mg by mouth daily.   gabapentin 300 MG capsule Commonly known as: NEURONTIN Take 1 capsule (300 mg total) by mouth 3 (three) times daily.   hydrochlorothiazide 25 MG  tablet Commonly known as: HYDRODIURIL Take 25 mg by mouth daily.   HYDROcodone-acetaminophen 5-325 MG tablet Commonly known as: Norco Take 1-2 tablets by mouth every 4 (four) hours as needed for moderate pain.   meclizine 25 MG tablet Commonly known as: ANTIVERT Take 1 tablet (25 mg total) by mouth 3 (three) times daily as needed for dizziness.   Misc. Devices Misc by Does not apply route at bedtime. CPAP machine   mometasone 50 MCG/ACT nasal spray Commonly known as: NASONEX Place 2 sprays into the nose daily as needed.   montelukast 10 MG tablet Commonly known as: SINGULAIR TAKE 1 TABLET BY MOUTH  AT BEDTIME   ondansetron 4 MG disintegrating tablet Commonly known as: Zofran ODT Take 1 tablet (4 mg total) by mouth every 8 (eight) hours as needed for nausea or vomiting.   Ozempic (1 MG/DOSE) 4 MG/3ML Sopn Generic drug: Semaglutide (1 MG/DOSE) INJECT 1 MG INTO THE SKIN EVERY WEEK   pantoprazole 40 MG tablet Commonly known as: PROTONIX take 1 tablet by mouth once daily 30 TO 60 MINUTES BEFORE THE FIRST MEAL OF THE DAY   rosuvastatin 20 MG tablet Commonly known as: CRESTOR TK 1 T PO QD   tiZANidine 2 MG tablet Commonly known as: ZANAFLEX Take 1 tablet (2 mg total) by mouth every 8 (eight) hours as needed for muscle spasms.   valACYclovir 1000 MG tablet Commonly known as: VALTREX Take 1,000 mg by mouth 3 (three) times daily.   Vraylar 4.5 MG Caps Generic drug: Cariprazine HCl Take 1 capsule by mouth daily.        Signature:  Chesley Mires, MD [facility] Pager - [phone] 07/09/2021, 9:41 AM

## 2021-07-09 NOTE — Patient Instructions (Signed)
It is okay for you to proceed with colonoscopy.  Please call Dr. Kathline Magic office to discuss the issues you are having with your swallowing.  Follow up in 1 year

## 2021-07-10 NOTE — Telephone Encounter (Signed)
Ov notes and clearance form has been faxed back to [facility]. Nothing further needed at this time.

## 2021-07-22 ENCOUNTER — Telehealth: Payer: Self-pay | Admitting: Pulmonary Disease

## 2021-07-22 NOTE — Telephone Encounter (Signed)
Called Melissa and she was not available  I need to tell her that we do not fill out the clearance forms, we sent ov note containing risk assessment  Pt had ov here with Dr Halford Chessman and was cleared for colonoscopy on 07/09/21  We needs fax number that they want this sent to  Will await her call back

## 2021-07-22 NOTE — Telephone Encounter (Signed)
Dr. Halford Chessman, please advise on message from [facility] about pt's Eliquis. Thanks!

## 2021-07-22 NOTE — Telephone Encounter (Signed)
I do not manage her eliquis.  This will need to be directed to her PCP.

## 2021-07-23 NOTE — Telephone Encounter (Signed)
Will need to call [facility] tomorrow as they are closed for the day to let them know that Dr. Halford Chessman said they need to call and ask PCP about Eliquis

## 2021-07-24 NOTE — Telephone Encounter (Signed)
Attempted to call Melissa with [facility] to tell them that they need to call PCP in regards to pt's Eliquis but was on hold for 10 minutes and never was able to get anyone on the phone.  Will try to call again later.

## 2021-07-25 NOTE — Telephone Encounter (Signed)
Called [phone] and spoke with Judeen Hammans at Dr. Sheilah Mins office.  She verified that she had faxed information to [facility] regarding start and stop date for her Eliquis.  Nothing further needed.

## 2021-08-27 ENCOUNTER — Other Ambulatory Visit: Payer: Self-pay

## 2021-08-27 ENCOUNTER — Ambulatory Visit
Admission: EM | Admit: 2021-08-27 | Discharge: 2021-08-27 | Disposition: A | Discharge status: Home / Self Care | Payer: Medicaid Other | Attending: Internal Medicine | Admitting: Internal Medicine

## 2021-08-27 DIAGNOSIS — L089 Local infection of the skin and subcutaneous tissue, unspecified: Secondary | ICD-10-CM

## 2021-08-27 MED ORDER — CEPHALEXIN 500 MG PO CAPS: 500.0000 mg | ORAL_CAPSULE | Freq: Four times a day (QID) | ORAL | 0 refills | Status: AC

## 2021-08-27 NOTE — ED Provider Notes (Signed)
?[name] ? ? ? ?CSN: [id number] ?Arrival date & time: 08/27/21  1258 ? ? ?  ? ?History   ?Chief Complaint ?Chief Complaint  ?Patient presents with  ? right hand 3rd digit infection  ? ? ?HPI ?Patricia Ramsey is a 56 y.o. female.  ? ?Patient presents with swelling and erythema noted to distal end of left third digit that started about a month ago.  Patient reports that she thinks that a bug bit her finger and caused the swelling.  Denies any numbness or tingling.  Patient has full range of motion of finger.  Denies any purulent drainage from finger.  Denies fevers, body aches, chills. ? ? ? ?Past Medical History:  ?Diagnosis Date  ? Asthma   ? Diabetes ([place])   ? boarderline, pt stopped Glucophage  ? GERD (gastroesophageal reflux disease)   ? Hypertension   ? no meds  ? Pulmonary embolism ([place])   ? Sleep apnea   ? ? ?Patient Active Problem List  ? Diagnosis Date Noted  ? Radial styloid tenosynovitis (de quervain) 08/30/2020  ? Eosinophilia 01/18/2020  ? Bilateral primary osteoarthritis of knee 12/16/2018  ? Acute non-recurrent sinusitis 05/10/2018  ? Bronchitis 05/10/2018  ? Acute left-sided low back pain without sciatica 05/10/2018  ? Chronic low back pain without sciatica 07/28/2016  ? Cough variant asthma vs UACS 06/24/2015  ? Morbid obesity due to excess calories ([place]) 02/23/2015  ? Dyspnea 02/22/2015  ? Acute pulmonary embolism ([place]) 10/09/2014  ? HTN (hypertension) 10/09/2014  ? Sleep apnea in adult 10/09/2014  ? Chest pain   ? ? ?Past Surgical History:  ?Procedure Laterality Date  ? ANKLE SURGERY    ? CHONDROPLASTY Right 09/21/2014  ? Procedure: CHONDROPLASTY right knee;  Surgeon: [name], MD;  Location: [facility];  Service: Orthopedics;  Laterality: Right;  ? DORSAL COMPARTMENT RELEASE Left 08/30/2020  ? Procedure: LEFT FIRST DORSAL COMPARTMENT (DEQUERVAIN RELEASE;  Surgeon: [name], MD;  Location: [facility];  Service: Orthopedics;  Laterality: Left;  ?  KNEE ARTHROSCOPY Right 09/21/2014  ? Procedure:  right knee arthroscopy;  Surgeon: [name], MD;  Location: [facility];  Service: Orthopedics;  Laterality: Right;  ? LITHOTRIPSY    ? oophrectomy    ? TUBAL LIGATION    ? ? ?OB History   ?No obstetric history on file. ?  ? ? ? ?Home Medications   ? ?Prior to Admission medications   ?Medication Sig Start Date End Date Taking? Authorizing Provider  ?cephALEXin (KEFLEX) 500 MG capsule Take 1 capsule (500 mg total) by mouth 4 (four) times daily for 5 days. 08/27/21 09/01/21 Yes Janaiyah Blackard, Michele Rockers, FNP  ?albuterol (PROAIR HFA) 108 (90 Base) MCG/ACT inhaler Inhale 2 puffs into the lungs every 6 (six) hours as needed for wheezing or shortness of breath. 2 puffs every 4 hours as needed only  if your can't catch your breath 07/09/21   Chesley Mires, MD  ?albuterol (PROVENTIL) (2.5 MG/3ML) 0.083% nebulizer solution INHALE 3 MLS VIA NEBULIZER EVERY 6 HOURS AS NEEDED FOR WHEEZING AND SHORTNESS OF BREATH 07/09/21   Chesley Mires, MD  ?ALPRAZolam Duanne Moron) 0.5 MG tablet Take 0.5 mg by mouth at bedtime. 12/24/16   [provider]  ?amLODipine (NORVASC) 5 MG tablet Take 5 mg by mouth daily.  07/04/17   [provider]  ?apixaban (ELIQUIS) 5 MG TABS tablet Take 1 tablet (5 mg total) by mouth 2 (two) times daily.  06/24/15   Tanda Rockers, MD  ?citalopram (CELEXA) 20 MG tablet Take 20 mg by mouth daily. 04/08/21   [provider]  ?famotidine (PEPCID) 20 MG tablet Take 1 tablet (20 mg total) by mouth at bedtime. 11/25/18   Chesley Mires, MD  ?FLUoxetine (PROZAC) 20 MG capsule Take 20 mg by mouth daily. 01/08/18   [provider]  ?gabapentin (NEURONTIN) 300 MG capsule Take 1 capsule (300 mg total) by mouth 3 (three) times daily. 10/13/14   Reyne Dumas, MD  ?hydrochlorothiazide (HYDRODIURIL) 25 MG tablet Take 25 mg by mouth daily. 02/07/15   [provider]  ?HYDROcodone-acetaminophen (NORCO) 5-325 MG tablet Take 1-2 tablets by mouth every 4  (four) hours as needed for moderate pain. 08/30/20   [name], MD  ?meclizine (ANTIVERT) 25 MG tablet Take 1 tablet (25 mg total) by mouth 3 (three) times daily as needed for dizziness. 05/10/21   Francene Finders, PA-C  ?Misc. Devices MISC by Does not apply route at bedtime. CPAP machine    [provider]  ?mometasone (NASONEX) 50 MCG/ACT nasal spray Place 2 sprays into the nose daily as needed. 03/16/19   [name], NP  ?mometasone-formoterol (DULERA) 100-5 MCG/ACT AERO INHALE 2 PUFFS BY MOUTH FIRST THING IN THE AM AND THEN ANOTHER 2 PUFFS ABOUT 12 HOURS LATER 07/09/21   Chesley Mires, MD  ?montelukast (SINGULAIR) 10 MG tablet TAKE 1 TABLET BY MOUTH AT BEDTIME 05/16/20   Chesley Mires, MD  ?ondansetron (ZOFRAN ODT) 4 MG disintegrating tablet Take 1 tablet (4 mg total) by mouth every 8 (eight) hours as needed for nausea or vomiting. 02/14/20   Hall-Potvin, [place], PA-C  ?OZEMPIC, 1 MG/DOSE, 4 MG/3ML SOPN INJECT 1 MG INTO THE SKIN EVERY WEEK 12/26/19   [provider]  ?pantoprazole (PROTONIX) 40 MG tablet take 1 tablet by mouth once daily 30 TO 60 MINUTES BEFORE THE FIRST MEAL OF THE DAY 03/06/20   Chesley Mires, MD  ?rosuvastatin (CRESTOR) 20 MG tablet TK 1 T PO QD 01/08/18   [provider]  ?tiZANidine (ZANAFLEX) 2 MG tablet Take 1 tablet (2 mg total) by mouth every 8 (eight) hours as needed for muscle spasms. 02/11/21   Hazel Sams, PA-C  ?valACYclovir (VALTREX) 1000 MG tablet Take 1,000 mg by mouth 3 (three) times daily. 06/27/20   [provider]  ?VRAYLAR 4.5 MG CAPS Take 1 capsule by mouth daily. 05/29/21   [provider]  ?sodium chloride (OCEAN) 0.65 % SOLN nasal spray Place 2 sprays into both nostrils as needed for congestion. 05/30/19 02/14/20  [name], NP  ? ? ?Family History ?Family History  ?Problem Relation Age of Onset  ? Heart failure Mother   ? Hypertension Mother   ? ? ?Social History ?Social History  ? ?Tobacco Use  ? Smoking  status: Never  ? Smokeless tobacco: Never  ?Vaping Use  ? Vaping Use: Never used  ?Substance Use Topics  ? Alcohol use: No  ? Drug use: No  ? ? ? ?Allergies   ?Patient has no known allergies. ? ? ?Review of Systems ?Review of Systems ?Per HPI ? ?Physical Exam ?Triage Vital Signs ?ED Triage Vitals  ?Enc Vitals Group  ?   BP 08/27/21 1353 130/83  ?   Pulse Rate 08/27/21 1353 69  ?   Resp 08/27/21 1353 18  ?   Temp 08/27/21 1353 98.2 ?F (36.8 ?C)  ?   Temp Source 08/27/21 1353 Oral  ?  SpO2 08/27/21 1353 98 %  ?   Weight --   ?   Height --   ?   Head Circumference --   ?   Peak Flow --   ?   Pain Score 08/27/21 1354 0  ?   Pain Loc --   ?   Pain Edu? --   ?   Excl. in [place]? --   ? ?No data found. ? ?Updated Vital Signs ?BP 130/83 (BP Location: Left Arm)   Pulse 69   Temp 98.2 ?F (36.8 ?C) (Oral)   Resp 18   SpO2 98%  ? ?Visual Acuity ?Right Eye Distance:   ?Left Eye Distance:   ?Bilateral Distance:   ? ?Right Eye Near:   ?Left Eye Near:    ?Bilateral Near:    ? ?Physical Exam ?Constitutional:   ?   General: She is not in acute distress. ?   Appearance: Normal appearance. She is not toxic-appearing or diaphoretic.  ?HENT:  ?   Head: Normocephalic and atraumatic.  ?Eyes:  ?   Extraocular Movements: Extraocular movements intact.  ?   Conjunctiva/sclera: Conjunctivae normal.  ?Pulmonary:  ?   Effort: Pulmonary effort is normal.  ?Musculoskeletal:  ?   Comments: Patient has full range of motion of digit.  Neurovascular intact.  ?Skin: ?   Comments: Yellow discoloration with mild swelling noted to medial portion of skin surrounding nail of left third digit.  There are also wartlike lesions present superior to nail on skin of finger.  ?Neurological:  ?   General: No focal deficit present.  ?   Mental Status: She is alert and oriented to person, place, and time. Mental status is at baseline.  ?Psychiatric:     ?   Mood and Affect: Mood normal.     ?   Behavior: Behavior normal.     ?   Thought Content: Thought content  normal.     ?   Judgment: Judgment normal.  ? ? ? ?UC Treatments / Results  ?Labs ?(all labs ordered are listed, but only abnormal results are displayed) ?Labs Reviewed - No data to display ? ?EKG ? ? ?Radiology ?No res

## 2021-08-27 NOTE — ED Triage Notes (Signed)
Pt c/o cut to right hand 3rd digit > 1 month ago. States since then it has not healed, she has "pulled a bunch of dead skin from it." Reports it is painful for her.  ?

## 2021-08-27 NOTE — Discharge Instructions (Signed)
You are being treated with an antibiotic.  Please follow-up with primary care doctor for further evaluation and management. ?

## 2021-09-02 ENCOUNTER — Ambulatory Visit: Payer: Medicare Other | Admitting: Orthopaedic Surgery

## 2021-09-02 ENCOUNTER — Encounter: Payer: Self-pay | Admitting: Orthopaedic Surgery

## 2021-09-02 VITALS — BP 132/88 | HR 73 | Ht 64.0 in | Wt 248.2 lb

## 2021-09-02 DIAGNOSIS — M1711 Unilateral primary osteoarthritis, right knee: Secondary | ICD-10-CM | POA: Diagnosis not present

## 2021-09-02 DIAGNOSIS — M1712 Unilateral primary osteoarthritis, left knee: Secondary | ICD-10-CM | POA: Diagnosis not present

## 2021-09-02 DIAGNOSIS — M170 Bilateral primary osteoarthritis of knee: Secondary | ICD-10-CM

## 2021-09-02 NOTE — Progress Notes (Signed)
? ?Office Visit Note ?  ?Patient: Patricia Ramsey           ?Date of Birth: 1965-12-28           ?MRN: [id number] ?Visit Date: 09/02/2021 ?             ?Requested by: Lucianne Lei, MD ?[address] ?[address] ?[place],  [place] [address] ?PCP: Lucianne Lei, MD ? ? ?Assessment & Plan: ?Visit Diagnoses:  ?1. Bilateral primary osteoarthritis of knee   ? ? ?Plan: Left knee injected today.  Previous right knee injected in January.  Continue weight loss.  We will recheck her in several months and can discuss scheduling surgery when she reaches her goal. ? ?Follow-Up Instructions: No follow-ups on file.  ? ?Orders:  ?Orders Placed This Encounter  ?Procedures  ? Large Joint Inj  ? ?No orders of the defined types were placed in this encounter. ? ? ? ? Procedures: ?Large Joint Inj: L knee on 09/14/2021 11:44 AM ?Indications: joint swelling and pain ?Details: 22 G 1.5 in needle, anterolateral approach ? ?Arthrogram: No ? ?Medications: 0.5 mL lidocaine 1 %; 3 mL bupivacaine 0.5 %; 40 mg methylPREDNISolone acetate 40 MG/ML ?Outcome: tolerated well, no immediate complications ?Procedure, treatment alternatives, risks and benefits explained, specific risks discussed. Consent was given by the patient. Immediately prior to procedure a time out was called to verify the correct patient, procedure, equipment, support staff and site/side marked as required. Patient was prepped and draped in the usual sterile fashion.  ? ? ? ? ?Clinical Data: ?No additional findings. ? ? ?Subjective: ?Chief Complaint  ?Patient presents with  ? Left Knee - Pain  ? ? ?HPI 56 year old female with BMI 62 returns with ongoing problems with knee osteoarthritis.  Right knee injection 06/04/2021.  Patient's continue to work on weight loss.  Left knee is bothering her worse with pain and swelling occasional catching.  She is ambulatory with a cane.  Goal weight is 233 she currently is at 248 and has 15 pounds she needs to lose to reach her goal to get her BMI below 40 so  she can proceed with total knee arthroplasty.  She asked about a handicap sticker for her car we discussed she needs to continue to work on more walking not less walking. ? ?Review of Systems past history of pulmonary embolism bronchitis and low back pain bilateral knee osteoarthritis.  Negative for stroke or MI.  Positive for hypertension controlled. ? ? ?Objective: ?Vital Signs: BP 132/88   Pulse 73   Ht '5\' 4"'$  (1.626 m)   Wt 248 lb 3.2 oz (112.6 kg)   BMI 42.60 kg/m?  ? ?Physical Exam ?Constitutional:   ?   Appearance: She is well-developed.  ?HENT:  ?   Head: Normocephalic.  ?   Right Ear: External ear normal.  ?   Left Ear: External ear normal. There is no impacted cerumen.  ?Eyes:  ?   Pupils: Pupils are equal, round, and reactive to light.  ?Neck:  ?   Thyroid: No thyromegaly.  ?   Trachea: No tracheal deviation.  ?Cardiovascular:  ?   Rate and Rhythm: Normal rate.  ?Pulmonary:  ?   Effort: Pulmonary effort is normal.  ?Abdominal:  ?   Palpations: Abdomen is soft.  ?Musculoskeletal:  ?   Cervical back: No rigidity.  ?Skin: ?   General: Skin is warm and dry.  ?Neurological:  ?   Mental Status: She is alert and oriented to  person, place, and time.  ?Psychiatric:     ?   Behavior: Behavior normal.  ? ? ?Ortho Exam left knee 2+ effusion collateral ligaments are stable.  Joint line tenderness.  Reflexes are intact negative logroll. ? ?Specialty Comments:  ?No specialty comments available. ? ?Imaging: ?No results found. ? ? ?PMFS History: ?Patient Active Problem List  ? Diagnosis Date Noted  ? Radial styloid tenosynovitis (de quervain) 08/30/2020  ? Eosinophilia 01/18/2020  ? Bilateral primary osteoarthritis of knee 12/16/2018  ? Acute non-recurrent sinusitis 05/10/2018  ? Bronchitis 05/10/2018  ? Acute left-sided low back pain without sciatica 05/10/2018  ? Chronic low back pain without sciatica 07/28/2016  ? Cough variant asthma vs UACS 06/24/2015  ? Morbid obesity due to excess calories ([facility]) 02/23/2015  ?  Dyspnea 02/22/2015  ? Acute pulmonary embolism ([facility]) 10/09/2014  ? HTN (hypertension) 10/09/2014  ? Sleep apnea in adult 10/09/2014  ? Chest pain   ? ?Past Medical History:  ?Diagnosis Date  ? Asthma   ? Diabetes ([facility])   ? boarderline, pt stopped Glucophage  ? GERD (gastroesophageal reflux disease)   ? Hypertension   ? no meds  ? Pulmonary embolism ([facility])   ? Sleep apnea   ?  ?Family History  ?Problem Relation Age of Onset  ? Heart failure Mother   ? Hypertension Mother   ?  ?Past Surgical History:  ?Procedure Laterality Date  ? ANKLE SURGERY    ? CHONDROPLASTY Right 09/21/2014  ? Procedure: CHONDROPLASTY right knee;  Surgeon: [name], MD;  Location: [facility];  Service: Orthopedics;  Laterality: Right;  ? DORSAL COMPARTMENT RELEASE Left 08/30/2020  ? Procedure: LEFT FIRST DORSAL COMPARTMENT (DEQUERVAIN RELEASE;  Surgeon: [name], MD;  Location: [facility];  Service: Orthopedics;  Laterality: Left;  ? KNEE ARTHROSCOPY Right 09/21/2014  ? Procedure:  right knee arthroscopy;  Surgeon: [name], MD;  Location: [facility];  Service: Orthopedics;  Laterality: Right;  ? LITHOTRIPSY    ? oophrectomy    ? TUBAL LIGATION    ? ?Social History  ? ?Occupational History  ? Occupation: unemployed  ?Tobacco Use  ? Smoking status: Never  ? Smokeless tobacco: Never  ?Vaping Use  ? Vaping Use: Never used  ?Substance and Sexual Activity  ? Alcohol use: No  ? Drug use: No  ? Sexual activity: Not on file  ? ? ? ? ? ? ?

## 2021-09-14 DIAGNOSIS — M1712 Unilateral primary osteoarthritis, left knee: Secondary | ICD-10-CM | POA: Diagnosis not present

## 2021-09-14 MED ORDER — METHYLPREDNISOLONE ACETATE 40 MG/ML IJ SUSP
40.0000 mg | INTRAMUSCULAR | Status: AC | PRN
  Administered 2021-09-14: 40 mg via INTRA_ARTICULAR

## 2021-09-14 MED ORDER — BUPIVACAINE HCL 0.5 % IJ SOLN
3.0000 mL | INTRAMUSCULAR | Status: AC | PRN
  Administered 2021-09-14: 3 mL via INTRA_ARTICULAR

## 2021-09-14 MED ORDER — LIDOCAINE HCL 1 % IJ SOLN
0.5000 mL | INTRAMUSCULAR | Status: AC | PRN
  Administered 2021-09-14: .5 mL

## 2021-12-15 ENCOUNTER — Other Ambulatory Visit (HOSPITAL_COMMUNITY): Payer: Self-pay

## 2022-02-06 ENCOUNTER — Encounter: Payer: Self-pay | Admitting: Orthopaedic Surgery

## 2022-02-06 ENCOUNTER — Ambulatory Visit (INDEPENDENT_AMBULATORY_CARE_PROVIDER_SITE_OTHER): Payer: Medicare Other | Admitting: Orthopaedic Surgery

## 2022-02-06 VITALS — Ht 64.0 in | Wt 235.0 lb

## 2022-02-06 DIAGNOSIS — M170 Bilateral primary osteoarthritis of knee: Secondary | ICD-10-CM

## 2022-02-06 DIAGNOSIS — M1711 Unilateral primary osteoarthritis, right knee: Secondary | ICD-10-CM | POA: Diagnosis not present

## 2022-02-06 MED ORDER — METHYLPREDNISOLONE ACETATE 40 MG/ML IJ SUSP
40.0000 mg | INTRAMUSCULAR | Status: AC | PRN
  Administered 2022-02-06: 40 mg via INTRA_ARTICULAR

## 2022-02-06 MED ORDER — BUPIVACAINE HCL 0.25 % IJ SOLN
4.0000 mL | INTRAMUSCULAR | Status: AC | PRN
  Administered 2022-02-06: 4 mL via INTRA_ARTICULAR

## 2022-02-06 MED ORDER — LIDOCAINE HCL 1 % IJ SOLN
0.5000 mL | INTRAMUSCULAR | Status: AC | PRN
  Administered 2022-02-06: .5 mL

## 2022-02-06 NOTE — Progress Notes (Signed)
Office Visit Note   Patient: Patricia Ramsey           Date of Birth: [birth year]           MRN: [id number] Visit Date: 02/06/2022              Requested by: Lucianne Lei, [address] [place],  [place] [address] PCP: Lucianne Lei, MD   Assessment & Plan: Visit Diagnoses:  1. Bilateral primary osteoarthritis of knee     Plan: Right knee injection performed.  Recheck 3 months.  We will weigh patient.  If she reaches her goal we can proceed with scheduling right total knee arthroplasty.  Follow-Up Instructions: Return in about 3 months (around 05/08/2022).   Orders:  Orders Placed This Encounter  Procedures   Large Joint Inj   No orders of the defined types were placed in this encounter.     Procedures: Large Joint Inj: R knee on 02/06/2022 1:40 PM Indications: pain and joint swelling Details: 22 G 1.5 in needle, anterolateral approach  Arthrogram: No  Medications: 40 mg methylPREDNISolone acetate 40 MG/ML; 0.5 mL lidocaine 1 %; 4 mL bupivacaine 0.25 % Outcome: tolerated well, no immediate complications Procedure, treatment alternatives, risks and benefits explained, specific risks discussed. Consent was given by the patient. Immediately prior to procedure a time out was called to verify the correct patient, procedure, equipment, support staff and site/side marked as required. Patient was prepped and draped in the usual sterile fashion.       Clinical Data: No additional findings.   Subjective: Chief Complaint  Patient presents with   Right Knee - Pain   Left Knee - Pain    HPI 56 year old female returns with the bilateral knee osteoarthritis worse on the right knee than left knee.  She is gradually continued to lose weight and is at 235 pounds today with close.  Goal weight is 233.  She is staying with her daughter and states she is trying to get around by set up would like to get this done for she schedules total knee arthroplasty.  She is requesting  repeat right knee injection at today's visit.  Review of Systems updated unchanged.   Objective: Vital Signs: Ht '5\' 4"'$  (1.626 m)   Wt 235 lb (106.6 kg)   BMI 40.34 kg/m   Physical Exam Constitutional:      Appearance: She is well-developed.  HENT:     Head: Normocephalic.     Right Ear: External ear normal.     Left Ear: External ear normal. There is no impacted cerumen.  Eyes:     Pupils: Pupils are equal, round, and reactive to light.  Neck:     Thyroid: No thyromegaly.     Trachea: No tracheal deviation.  Cardiovascular:     Rate and Rhythm: Normal rate.  Pulmonary:     Effort: Pulmonary effort is normal.  Abdominal:     Palpations: Abdomen is soft.  Musculoskeletal:     Cervical back: No rigidity.  Skin:    General: Skin is warm and dry.  Neurological:     Mental Status: She is alert and oriented to person, place, and time.  Psychiatric:        Behavior: Behavior normal.     Ortho Exam patient has medial and lateral joint line tenderness crepitus with knee range of motion 2+ knee effusion negative logroll hips distal pulses are intact.  Specialty Comments:  No specialty comments available.  Imaging: No results found.   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/30/2020   Eosinophilia 01/18/2020   Bilateral primary osteoarthritis of knee 12/16/2018   Acute non-recurrent sinusitis 05/10/2018   Bronchitis 05/10/2018   Acute left-sided low back pain without sciatica 05/10/2018   Chronic low back pain without sciatica 07/28/2016   Cough variant asthma vs UACS 06/24/2015   Morbid obesity due to excess calories ([facility]) 02/23/2015   Dyspnea 02/22/2015   Acute pulmonary embolism ([facility]) 10/09/2014   HTN (hypertension) 10/09/2014   Sleep apnea in adult 10/09/2014   Chest pain    Past Medical History:  Diagnosis Date   Asthma    Diabetes ([facility])    boarderline, pt stopped Glucophage   GERD  (gastroesophageal reflux disease)    Hypertension    no meds   Pulmonary embolism ([facility])    Sleep apnea     Family History  Problem Relation Age of Onset   Heart failure Mother    Hypertension Mother     Past Surgical History:  Procedure Laterality Date   ANKLE SURGERY     CHONDROPLASTY Right 09/21/2014   Procedure: CHONDROPLASTY right knee;  Surgeon: [name], MD;  Location: [facility];  Service: Orthopedics;  Laterality: Right;   DORSAL COMPARTMENT RELEASE Left 08/30/2020   Procedure: LEFT FIRST DORSAL COMPARTMENT (DEQUERVAIN RELEASE;  Surgeon: [name], MD;  Location: [facility];  Service: Orthopedics;  Laterality: Left;   KNEE ARTHROSCOPY Right 09/21/2014   Procedure:  right knee arthroscopy;  Surgeon: [name], MD;  Location: [facility];  Service: Orthopedics;  Laterality: Right;   LITHOTRIPSY     oophrectomy     TUBAL LIGATION     Social History   Occupational History   Occupation: unemployed  Tobacco Use   Smoking status: Never   Smokeless tobacco: Never  Vaping Use   Vaping Use: Never used  Substance and Sexual Activity   Alcohol use: No   Drug use: No   Sexual activity: Not on file

## 2022-05-06 ENCOUNTER — Ambulatory Visit: Payer: Medicaid Other | Admitting: Orthopaedic Surgery

## 2022-05-29 ENCOUNTER — Encounter (HOSPITAL_COMMUNITY): Payer: Self-pay | Admitting: Emergency Medicine

## 2022-05-29 ENCOUNTER — Ambulatory Visit (HOSPITAL_COMMUNITY)
Admission: EM | Admit: 2022-05-29 | Discharge: 2022-05-29 | Disposition: A | Discharge status: Home / Self Care | Payer: Medicare Other | Attending: Internal Medicine | Admitting: Internal Medicine

## 2022-05-29 DIAGNOSIS — Z20828 Contact with and (suspected) exposure to other viral communicable diseases: Secondary | ICD-10-CM

## 2022-05-29 DIAGNOSIS — R6889 Other general symptoms and signs: Secondary | ICD-10-CM | POA: Diagnosis not present

## 2022-05-29 LAB — POC INFLUENZA A AND B ANTIGEN (URGENT CARE ONLY)
INFLUENZA A ANTIGEN, POC: NEGATIVE
INFLUENZA B ANTIGEN, POC: NEGATIVE

## 2022-05-29 MED ORDER — ACETAMINOPHEN 325 MG PO TABS
975.0000 mg | ORAL_TABLET | Freq: Once | ORAL | Status: AC
  Administered 2022-05-29: 975 mg via ORAL

## 2022-05-29 MED ORDER — ACETAMINOPHEN 325 MG PO TABS
ORAL_TABLET | ORAL | Status: AC
  Filled 2022-05-29: qty 3

## 2022-05-29 MED ORDER — IBUPROFEN 800 MG PO TABS
ORAL_TABLET | ORAL | Status: AC
  Filled 2022-05-29: qty 1

## 2022-05-29 MED ORDER — OSELTAMIVIR PHOSPHATE 75 MG PO CAPS: 75.0000 mg | ORAL_CAPSULE | Freq: Two times a day (BID) | ORAL | 0 refills | Status: AC

## 2022-05-29 MED ORDER — IBUPROFEN 800 MG PO TABS
800.0000 mg | ORAL_TABLET | Freq: Once | ORAL | Status: AC
  Administered 2022-05-29: 800 mg via ORAL

## 2022-05-29 NOTE — Discharge Instructions (Addendum)
Your flu test here was negative, but I am treating you for the flu due to your sick contacts and symptoms. Tamiflu prescription is attached.   Please use ibuprofen/tylenol every 4-6 hours to control aches and fever. You may have several more days of high fever, but it should respond to medicine. Drink lots of fluids.

## 2022-05-29 NOTE — ED Triage Notes (Signed)
Pt reports for 2 days having headaches, body aches, chills, sweats, cough, sore throat. Family members have the flu. Took tylenol earlier today and inhaler.

## 2022-05-29 NOTE — ED Provider Notes (Signed)
[facility]    CSN: [id number] Arrival date & time: 05/29/22  1954     History   Chief Complaint Chief Complaint  Patient presents with   Headache   Generalized Body Aches    HPI Patricia Ramsey is a 56 y.o. female.  Presents with 2 day history of flu symptoms Headache, body aches, chills, cough, sore throat Fever tmax 103 (here) No vomiting/diarrhea. Tolerating fluids by mouth  Took tylenol and used albuterol inhaler  Multiple family members with positive flu  Past Medical History:  Diagnosis Date   Asthma    Diabetes ([facility])    boarderline, pt stopped Glucophage   GERD (gastroesophageal reflux disease)    Hypertension    no meds   Pulmonary embolism ([facility])    Sleep apnea     Patient Active Problem List   Diagnosis Date Noted   Radial styloid tenosynovitis (de quervain) 08/30/2020   Eosinophilia 01/18/2020   Bilateral primary osteoarthritis of knee 12/16/2018   Acute non-recurrent sinusitis 05/10/2018   Bronchitis 05/10/2018   Acute left-sided low back pain without sciatica 05/10/2018   Chronic low back pain without sciatica 07/28/2016   Cough variant asthma vs UACS 06/24/2015   Morbid obesity due to excess calories ([facility]) 02/23/2015   Dyspnea 02/22/2015   Acute pulmonary embolism ([facility]) 10/09/2014   HTN (hypertension) 10/09/2014   Sleep apnea in adult 10/09/2014   Chest pain     Past Surgical History:  Procedure Laterality Date   ANKLE SURGERY     CHONDROPLASTY Right 09/21/2014   Procedure: CHONDROPLASTY right knee;  Surgeon: [name], MD;  Location: [facility];  Service: Orthopedics;  Laterality: Right;   DORSAL COMPARTMENT RELEASE Left 08/30/2020   Procedure: LEFT FIRST DORSAL COMPARTMENT (DEQUERVAIN RELEASE;  Surgeon: [name], MD;  Location: [facility];  Service: Orthopedics;  Laterality: Left;   KNEE ARTHROSCOPY Right 09/21/2014   Procedure:  right knee arthroscopy;  Surgeon: [name], MD;   Location: [facility];  Service: Orthopedics;  Laterality: Right;   LITHOTRIPSY     oophrectomy     TUBAL LIGATION      OB History   No obstetric history on file.      Home Medications    Prior to Admission medications   Medication Sig Start Date End Date Taking? Authorizing Provider  oseltamivir (TAMIFLU) 75 MG capsule Take 1 capsule (75 mg total) by mouth every 12 (twelve) hours for 5 days. 05/29/22 06/03/22 Yes Meredith Kilbride, Wells Guiles, PA-C  albuterol (PROAIR HFA) 108 (90 Base) MCG/ACT inhaler Inhale 2 puffs into the lungs every 6 (six) hours as needed for wheezing or shortness of breath. 2 puffs every 4 hours as needed only  if your can't catch your breath 07/09/21   Chesley Mires, MD  albuterol (PROVENTIL) (2.5 MG/3ML) 0.083% nebulizer solution INHALE 3 MLS VIA NEBULIZER EVERY 6 HOURS AS NEEDED FOR WHEEZING AND SHORTNESS OF BREATH 07/09/21   Chesley Mires, MD  ALPRAZolam Duanne Moron) 0.5 MG tablet Take 0.5 mg by mouth at bedtime. 12/24/16   [provider]  amLODipine (NORVASC) 5 MG tablet Take 5 mg by mouth daily.  07/04/17   [provider]  apixaban (ELIQUIS) 5 MG TABS tablet Take 1 tablet (5 mg total) by mouth 2 (two) times daily. 06/24/15   Tanda Rockers, MD  citalopram (CELEXA) 20 MG tablet Take 20 mg by mouth daily. 04/08/21   [provider]  famotidine (PEPCID) 20  MG tablet Take 1 tablet (20 mg total) by mouth at bedtime. 11/25/18   Chesley Mires, MD  FLUoxetine (PROZAC) 20 MG capsule Take 20 mg by mouth daily. 01/08/18   [provider]  gabapentin (NEURONTIN) 300 MG capsule Take 1 capsule (300 mg total) by mouth 3 (three) times daily. 10/13/14   Reyne Dumas, MD  hydrochlorothiazide (HYDRODIURIL) 25 MG tablet Take 25 mg by mouth daily. 02/07/15   [provider]  HYDROcodone-acetaminophen (NORCO) 5-325 MG tablet Take 1-2 tablets by mouth every 4 (four) hours as needed for moderate pain. Patient not taking: Reported on 02/06/2022 08/30/20   [name], MD  meclizine (ANTIVERT) 25 MG tablet Take 1 tablet (25 mg total) by mouth 3 (three) times daily as needed for dizziness. 05/10/21   Francene Finders, PA-C  Misc. Devices MISC by Does not apply route at bedtime. CPAP machine    [provider]  mometasone (NASONEX) 50 MCG/ACT nasal spray Place 2 sprays into the nose daily as needed. 03/16/19   [name], NP  mometasone-formoterol (DULERA) 100-5 MCG/ACT AERO INHALE 2 PUFFS BY MOUTH FIRST THING IN THE AM AND THEN ANOTHER 2 PUFFS ABOUT 12 HOURS LATER 07/09/21   Chesley Mires, MD  montelukast (SINGULAIR) 10 MG tablet TAKE 1 TABLET BY MOUTH AT BEDTIME 05/16/20   Chesley Mires, MD  ondansetron (ZOFRAN ODT) 4 MG disintegrating tablet Take 1 tablet (4 mg total) by mouth every 8 (eight) hours as needed for nausea or vomiting. 02/14/20   Hall-Potvin, [place], PA-C  OZEMPIC, 1 MG/DOSE, 4 MG/3ML SOPN INJECT 1 MG INTO THE SKIN EVERY WEEK 12/26/19   [provider]  pantoprazole (PROTONIX) 40 MG tablet take 1 tablet by mouth once daily 30 TO 60 MINUTES BEFORE THE FIRST MEAL OF THE DAY 03/06/20   Chesley Mires, MD  rosuvastatin (CRESTOR) 20 MG tablet TK 1 T PO QD 01/08/18   [provider]  tiZANidine (ZANAFLEX) 2 MG tablet Take 1 tablet (2 mg total) by mouth every 8 (eight) hours as needed for muscle spasms. 02/11/21   Hazel Sams, PA-C  valACYclovir (VALTREX) 1000 MG tablet Take 1,000 mg by mouth 3 (three) times daily. 06/27/20   [provider]  VRAYLAR 4.5 MG CAPS Take 1 capsule by mouth daily. 05/29/21   [provider]  sodium chloride (OCEAN) 0.65 % SOLN nasal spray Place 2 sprays into both nostrils as needed for congestion. 05/30/19 02/14/20  [name], NP    Family History Family History  Problem Relation Age of Onset   Heart failure Mother    Hypertension Mother     Social History Social History   Tobacco Use   Smoking status: Never   Smokeless tobacco: Never  Vaping Use    Vaping Use: Never used  Substance Use Topics   Alcohol use: No   Drug use: No     Allergies   Patient has no known allergies.   Review of Systems Review of Systems As per HPI  Physical Exam Triage Vital Signs ED Triage Vitals  Enc Vitals Group     BP 05/29/22 2109 131/77     Pulse Rate 05/29/22 2109 97     Resp 05/29/22 2109 18     Temp 05/29/22 2109 (!) 102.9 F (39.4 C)     Temp Source 05/29/22 2109 Oral     SpO2 05/29/22 2109 95 %     Weight --      Height --  Head Circumference --      Peak Flow --      Pain Score 05/29/22 2108 9     Pain Loc --      Pain Edu? --      Excl. in [place]? --    No data found.  Updated Vital Signs BP 137/86 (BP Location: Right Arm)   Pulse (!) 110   Temp (!) 101 F (38.3 C) (Oral)   Resp 18   SpO2 94%   Physical Exam Vitals and nursing note reviewed.  Constitutional:      Appearance: She is ill-appearing.  HENT:     Nose: No congestion.     Mouth/Throat:     Mouth: Mucous membranes are moist.     Pharynx: Oropharynx is clear. No posterior oropharyngeal erythema.  Eyes:     Conjunctiva/sclera: Conjunctivae normal.  Cardiovascular:     Rate and Rhythm: Normal rate and regular rhythm.     Pulses: Normal pulses.     Heart sounds: Normal heart sounds.  Pulmonary:     Effort: Pulmonary effort is normal.     Breath sounds: Normal breath sounds.  Musculoskeletal:     Cervical back: Normal range of motion.  Lymphadenopathy:     Cervical: No cervical adenopathy.  Skin:    General: Skin is warm and dry.  Neurological:     Mental Status: She is alert and oriented to person, place, and time.     UC Treatments / Results  Labs (all labs ordered are listed, but only abnormal results are displayed) Labs Reviewed  POC INFLUENZA A AND B ANTIGEN (URGENT CARE ONLY)    EKG  Radiology No results found.  Procedures Procedures   Medications Ordered in UC Medications  acetaminophen (TYLENOL) tablet 975 mg (975 mg Oral  Given 05/29/22 2112)  ibuprofen (ADVIL) tablet 800 mg (800 mg Oral Given 05/29/22 2147)    Initial Impression / Assessment and Plan / UC Course  I have reviewed the triage vital signs and the nursing notes.  Pertinent labs & imaging results that were available during my care of the patient were reviewed by me and considered in my medical decision making (see chart for details).  103 temp here Tylenol dose given with minimal change. Ibuprofen dose given. Patient reports headache is much better. Temp down to 101   Ibuprofen dose given. Patient would like to go home. Discussed will take 30 minutes to kick in. Should lower fever and help aches.  Rapid flu is negative. Likely false negative given appearance, symptoms, and multiple flu positive contacts at home. Treating with tamiflu BID x 5 days Discussed continue tylenol/ibuprofen for aches and fever at home. Increase fluids as much as tolerated.  Return precautions discussed. Patient agrees to plan  Final Clinical Impressions(s) / UC Diagnoses   Final diagnoses:  Flu-like symptoms  Exposure to the flu     Discharge Instructions      Your flu test here was negative, but I am treating you for the flu due to your sick contacts and symptoms. Tamiflu prescription is attached.   Please use ibuprofen/tylenol every 4-6 hours to control aches and fever. You may have several more days of high fever, but it should respond to medicine. Drink lots of fluids.     ED Prescriptions     Medication Sig Dispense Auth. Provider   oseltamivir (TAMIFLU) 75 MG capsule Take 1 capsule (75 mg total) by mouth every 12 (twelve) hours for 5 days. 10  capsule Keisy Strickler, Wells Guiles, PA-C      PDMP not reviewed this encounter.   Cecillia Menees, Vernice Jefferson 05/29/22 2203

## 2022-06-02 ENCOUNTER — Ambulatory Visit (INDEPENDENT_AMBULATORY_CARE_PROVIDER_SITE_OTHER): Payer: Medicare Other | Admitting: Pulmonary Disease

## 2022-06-02 ENCOUNTER — Encounter (HOSPITAL_BASED_OUTPATIENT_CLINIC_OR_DEPARTMENT_OTHER): Payer: Self-pay | Admitting: Pulmonary Disease

## 2022-06-02 DIAGNOSIS — J45991 Cough variant asthma: Secondary | ICD-10-CM

## 2022-06-02 MED ORDER — DULERA 100-5 MCG/ACT IN AERO: 2.0000 | INHALATION_SPRAY | Freq: Two times a day (BID) | RESPIRATORY_TRACT | 5 refills | Status: DC

## 2022-06-02 MED ORDER — ALBUTEROL SULFATE (2.5 MG/3ML) 0.083% IN NEBU: 2.5000 mg | INHALATION_SOLUTION | Freq: Four times a day (QID) | RESPIRATORY_TRACT | 3 refills | Status: DC | PRN

## 2022-06-02 MED ORDER — PREDNISONE 10 MG PO TABS: ORAL_TABLET | ORAL | 0 refills | Status: AC

## 2022-06-02 NOTE — Progress Notes (Signed)
[facility]  Chief Complaint  Patient presents with   Acute Visit    Pt states she is wheezing and productive cough since x 4 days. Pt states she went to Urgent Care last Friday and was treated for the Flu with Tamiflu. Pt states she tested negative for the flu. SOB with exertion.     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  Brief Summary:  Patricia Ramsey is a 57 y.o. female with obstructive sleep apnea and cough variant asthma.      Subjective:   She had several family members recently who had the flu or RSV.  She developed sinus congestion and sore throat about 5 days ago.  She had fever 103F.  She went to the ER.  Flu swab was negative, but she was given script for tamiflu.  Fever better.  She is now having more cough with clear sputum.  She is having tightness in her chest and wheezing.  She denies hemoptysis, or leg swelling.  Nausea has resolved.    Physical Exam:   Appearance - well kempt   ENMT - no sinus tenderness, no oral exudate, no LAN, Mallampati 4 airway, no stridor  Respiratory - bilateral expiratory wheezing  CV - s1s2 regular rate and rhythm, no murmurs  Ext - no clubbing, no edema  Skin - no rashes  Psych - normal mood and affect    Pulmonary testing:  Spirometry 11/19/15 >> FEV1 1.74 (78%), FEV1% 79 IgE 12/28/19 >> 55 PFT 01/18/20 >> FEV1 1.78 (83%), FEV1% 82, TLC 4.33  (88%), DLCO 104%  Chest Imaging:  CT angio chest 10/09/14 >> PE with RV:LV ratio 2 CT angio chest 03/01/15 >> PE resolved  Sleep Tests:  PSG 11/22/15 >> AHI 42.6, SpO2 low 52% CPAP 04/09/21 to 07/07/20 >> used on 89 of 90 nights with average 7 hrs 59 min.  Average AHI 0.6 with CPAP 14 cm H2O  Cardiac Tests:  Doppler legs 10/09/14 >> Rt popliteal/peroneal vein DVT Echo 02/28/15 >> EF 60 to 65%  Social History:  She  reports that she has never smoked. She has never used smokeless tobacco. She reports that she does not drink alcohol and does not use drugs.  Family History:  Her family history includes Heart failure in her mother; Hypertension in her mother.     Assessment/Plan:   Cough variant asthma. - she has acute exacerbation after recent viral respiratory infection - complete course of tamiflu from the ER; don't think she needs antibiotics or a chest xray at this time - will give her taper course of prednisone - continue dulera 100 two puffs bid, and singulair 10 mg qhs - prn albuterol, mucinex - will arrange for a new nebulizer machine   Obstructive sleep apnea. - she is compliant with CPAP and reports benefit from therapy  - she uses [employer] for her DME - continue CPAP 14  cm H2O   Allergic rhinitis. - continue singulair, nasonex  Time Spent Involved in Patient Care on Day of Examination:  37 minutes  Follow up:   Patient Instructions  Prednisone 10 mg pill >> 4 pills daily for 2 days, 3 pills daily for 2 days, 2 pills daily for 2 days, 1 pill daily for 2 days  Dulera two puffs twice per day, and rinse your mouth after each use  Singulair 10 mg pill nightly  You can use mucinex as needed to help with cough and chest congestion  Will arrange for a home nebulizer machine  Albuterol every 6 hours as needed for cough, wheeze, chest congestion or shortness of breath  Follow up in 4 months  Medication List:   Allergies as of 06/02/2022   No Known Allergies       Medication List        Accurate as of June 02, 2022  2:21 PM. If you have any questions, ask your nurse or doctor.          albuterol 108 (90 Base) MCG/ACT inhaler Commonly known as: ProAir HFA Inhale 2 puffs into the lungs every 6 (six) hours as needed for wheezing or shortness of breath. 2 puffs every 4 hours as needed only  if your can't catch your breath What changed: Another medication with the same name was changed. Make sure you understand how and when to take each. Changed by: Chesley Mires, MD   albuterol (2.5 MG/3ML) 0.083% nebulizer solution Commonly known as: PROVENTIL Take 3 mLs (2.5 mg total) by nebulization every 6 (six) hours as needed for wheezing or shortness of breath. INHALE 3 MLS VIA NEBULIZER EVERY 6 HOURS AS NEEDED FOR WHEEZING AND SHORTNESS OF BREATH What changed:  how much to take how to take this when to take this reasons to take this Changed by: Chesley Mires, MD   ALPRAZolam 0.5 MG tablet Commonly known as: XANAX Take 0.5 mg by mouth at bedtime.   amLODipine 5 MG tablet Commonly known as: NORVASC Take 5 mg by mouth daily.   apixaban 5 MG Tabs tablet Commonly known as: ELIQUIS Take 1 tablet (5 mg total) by mouth 2 (two) times daily.   citalopram 20 MG tablet Commonly known as: CELEXA Take 20 mg by mouth daily.   Dulera 100-5 MCG/ACT Aero Generic drug: mometasone-formoterol Inhale 2 puffs into the lungs 2 (two) times daily. INHALE 2 PUFFS BY MOUTH FIRST THING IN THE AM AND THEN ANOTHER 2 PUFFS ABOUT 12 HOURS LATER What changed:  how much to take how to take this when to take this Changed by: Chesley Mires, MD   famotidine 20 MG tablet Commonly known as: PEPCID Take 1 tablet (20 mg total) by mouth at bedtime.   FLUoxetine 20 MG capsule Commonly known as: PROZAC Take 20 mg by mouth daily.   gabapentin 300 MG capsule Commonly known as: NEURONTIN Take 1 capsule (300 mg total) by mouth 3 (three) times daily.   hydrochlorothiazide 25 MG  tablet Commonly known as: HYDRODIURIL Take 25 mg by mouth daily.   HYDROcodone-acetaminophen 5-325 MG tablet Commonly known as: Norco Take 1-2 tablets by mouth every 4 (four) hours as needed for moderate pain.   meclizine 25 MG tablet Commonly known as: ANTIVERT Take 1 tablet (25 mg total) by mouth 3 (three) times daily as needed for dizziness.   Misc. Devices Misc by Does not apply route at bedtime. CPAP machine   mometasone 50 MCG/ACT nasal spray Commonly  known as: NASONEX Place 2 sprays into the nose daily as needed.   montelukast 10 MG tablet Commonly known as: SINGULAIR TAKE 1 TABLET BY MOUTH AT BEDTIME   ondansetron 4 MG disintegrating tablet Commonly known as: Zofran ODT Take 1 tablet (4 mg total) by mouth every 8 (eight) hours as needed for nausea or vomiting.   oseltamivir 75 MG capsule Commonly known as: TAMIFLU Take 1 capsule (75 mg total) by mouth every 12 (twelve) hours for 5 days.   Ozempic (1 MG/DOSE) 4 MG/3ML Sopn Generic drug: Semaglutide (1 MG/DOSE) INJECT 1 MG INTO THE SKIN EVERY WEEK   pantoprazole 40 MG tablet Commonly known as: PROTONIX take 1 tablet by mouth once daily 30 TO 60 MINUTES BEFORE THE FIRST MEAL OF THE DAY   predniSONE 10 MG tablet Commonly known as: DELTASONE Take 4 tablets (40 mg total) by mouth daily with breakfast for 2 days, THEN 3 tablets (30 mg total) daily with breakfast for 2 days, THEN 2 tablets (20 mg total) daily with breakfast for 2 days, THEN 1 tablet (10 mg total) daily with breakfast for 2 days. Start taking on: June 02, 2022 Started by: Chesley Mires, MD   rosuvastatin 20 MG tablet Commonly known as: CRESTOR TK 1 T PO QD   tiZANidine 2 MG tablet Commonly known as: ZANAFLEX Take 1 tablet (2 mg total) by mouth every 8 (eight) hours as needed for muscle spasms.   valACYclovir 1000 MG tablet Commonly known as: VALTREX Take 1,000 mg by mouth 3 (three) times daily.   Vraylar 4.5 MG Caps Generic drug: Cariprazine  HCl Take 1 capsule by mouth daily.        Signature:  Chesley Mires, MD [place] Pager - [phone] 06/02/2022, 2:21 PM

## 2022-06-02 NOTE — Patient Instructions (Signed)
Prednisone 10 mg pill >> 4 pills daily for 2 days, 3 pills daily for 2 days, 2 pills daily for 2 days, 1 pill daily for 2 days  Dulera two puffs twice per day, and rinse your mouth after each use  Singulair 10 mg pill nightly  You can use mucinex as needed to help with cough and chest congestion  Will arrange for a home nebulizer machine  Albuterol every 6 hours as needed for cough, wheeze, chest congestion or shortness of breath  Follow up in 4 months

## 2022-06-03 ENCOUNTER — Other Ambulatory Visit (HOSPITAL_COMMUNITY): Payer: Self-pay

## 2022-10-13 ENCOUNTER — Ambulatory Visit (HOSPITAL_BASED_OUTPATIENT_CLINIC_OR_DEPARTMENT_OTHER): Payer: Medicaid Other | Admitting: Pulmonary Disease

## 2022-10-15 ENCOUNTER — Other Ambulatory Visit (HOSPITAL_BASED_OUTPATIENT_CLINIC_OR_DEPARTMENT_OTHER): Payer: Self-pay

## 2022-10-15 MED ORDER — OZEMPIC (2 MG/DOSE) 8 MG/3ML ~~LOC~~ SOPN
2.0000 mg | PEN_INJECTOR | SUBCUTANEOUS | 0 refills | Status: AC
Start: 2022-10-16 — End: ?
  Filled 2022-10-16 – 2022-10-27 (×2): qty 6, 56d supply, fill #0

## 2022-10-16 ENCOUNTER — Other Ambulatory Visit (HOSPITAL_BASED_OUTPATIENT_CLINIC_OR_DEPARTMENT_OTHER): Payer: Self-pay

## 2022-10-27 ENCOUNTER — Other Ambulatory Visit (HOSPITAL_BASED_OUTPATIENT_CLINIC_OR_DEPARTMENT_OTHER): Payer: Self-pay

## 2022-10-31 ENCOUNTER — Encounter (HOSPITAL_BASED_OUTPATIENT_CLINIC_OR_DEPARTMENT_OTHER): Payer: Self-pay | Admitting: Emergency Medicine

## 2022-10-31 DIAGNOSIS — E119 Type 2 diabetes mellitus without complications: Secondary | ICD-10-CM | POA: Diagnosis not present

## 2022-10-31 DIAGNOSIS — Z7951 Long term (current) use of inhaled steroids: Secondary | ICD-10-CM | POA: Diagnosis not present

## 2022-10-31 DIAGNOSIS — R112 Nausea with vomiting, unspecified: Secondary | ICD-10-CM | POA: Insufficient documentation

## 2022-10-31 DIAGNOSIS — N390 Urinary tract infection, site not specified: Secondary | ICD-10-CM | POA: Diagnosis not present

## 2022-10-31 DIAGNOSIS — J45909 Unspecified asthma, uncomplicated: Secondary | ICD-10-CM | POA: Diagnosis not present

## 2022-10-31 DIAGNOSIS — E860 Dehydration: Secondary | ICD-10-CM | POA: Insufficient documentation

## 2022-10-31 DIAGNOSIS — I10 Essential (primary) hypertension: Secondary | ICD-10-CM | POA: Diagnosis not present

## 2022-10-31 NOTE — ED Triage Notes (Signed)
Patient reports vomiting since Thursday. And tonight notice blood tinged urine

## 2022-11-01 ENCOUNTER — Emergency Department (HOSPITAL_BASED_OUTPATIENT_CLINIC_OR_DEPARTMENT_OTHER)
Admission: EM | Admit: 2022-11-01 | Discharge: 2022-11-01 | Disposition: A | Discharge status: Home / Self Care | Payer: Medicare Other | Attending: Emergency Medicine | Admitting: Emergency Medicine

## 2022-11-01 ENCOUNTER — Emergency Department (HOSPITAL_BASED_OUTPATIENT_CLINIC_OR_DEPARTMENT_OTHER): Payer: Medicare Other

## 2022-11-01 DIAGNOSIS — N390 Urinary tract infection, site not specified: Secondary | ICD-10-CM

## 2022-11-01 DIAGNOSIS — E860 Dehydration: Secondary | ICD-10-CM

## 2022-11-01 DIAGNOSIS — R112 Nausea with vomiting, unspecified: Secondary | ICD-10-CM

## 2022-11-01 LAB — COMPREHENSIVE METABOLIC PANEL
ALT: 12 U/L (ref 0–44)
AST: 17 U/L (ref 15–41)
Albumin: 4.3 g/dL (ref 3.5–5.0)
Alkaline Phosphatase: 81 U/L (ref 38–126)
Anion gap: 8 (ref 5–15)
BUN: 11 mg/dL (ref 6–20)
CO2: 26 mmol/L (ref 22–32)
Calcium: 9.4 mg/dL (ref 8.9–10.3)
Chloride: 106 mmol/L (ref 98–111)
Creatinine, Ser: 0.79 mg/dL (ref 0.44–1.00)
GFR, Estimated: >60 mL/min (ref >60)
Glucose, Bld: 87 mg/dL (ref 70–99)
Potassium: 3.6 mmol/L (ref 3.5–5.1)
Sodium: 140 mmol/L (ref 135–145)
Total Bilirubin: 0.4 mg/dL (ref 0.3–1.2)
Total Protein: 7.5 g/dL (ref 6.5–8.1)

## 2022-11-01 LAB — CBC
HCT: 42.6 % (ref 36.0–46.0)
Hemoglobin: 14.1 g/dL (ref 12.0–15.0)
MCH: 29.1 pg (ref 26.0–34.0)
MCHC: 33.1 g/dL (ref 30.0–36.0)
MCV: 88 fL (ref 80.0–100.0)
Platelets: 269 10*3/uL (ref 150–400)
RBC: 4.84 MIL/uL (ref 3.87–5.11)
RDW: 14 % (ref 11.5–15.5)
WBC: 7.6 10*3/uL (ref 4.0–10.5)
nRBC: 0 % (ref 0.0–0.2)

## 2022-11-01 LAB — URINALYSIS, ROUTINE W REFLEX MICROSCOPIC
Bilirubin Urine: NEGATIVE
Glucose, UA: NEGATIVE mg/dL
Ketones, ur: NEGATIVE mg/dL
Nitrite: NEGATIVE
Protein, ur: 30 mg/dL — AB
RBC / HPF: >50 RBC/hpf (ref 0–5)
Specific Gravity, Urine: 1.033 — ABNORMAL HIGH (ref 1.005–1.030)
pH: 5.5 (ref 5.0–8.0)

## 2022-11-01 LAB — LIPASE, BLOOD: Lipase: <10 U/L — ABNORMAL LOW (ref 11–51)

## 2022-11-01 MED ORDER — METOCLOPRAMIDE HCL 5 MG/ML IJ SOLN
10.0000 mg | Freq: Once | INTRAMUSCULAR | Status: AC | PRN
  Administered 2022-11-01: 10 mg via INTRAVENOUS
  Filled 2022-11-01 (×2): qty 2

## 2022-11-01 MED ORDER — ONDANSETRON 8 MG PO TBDP: 8.0000 mg | ORAL_TABLET | Freq: Three times a day (TID) | ORAL | 0 refills | Status: DC | PRN

## 2022-11-01 MED ORDER — SODIUM CHLORIDE 0.9 % IV BOLUS
1000.0000 mL | Freq: Once | INTRAVENOUS | Status: AC
  Administered 2022-11-01: 1000 mL via INTRAVENOUS

## 2022-11-01 MED ORDER — SODIUM CHLORIDE 0.9 % IV SOLN
1.0000 g | Freq: Once | INTRAVENOUS | Status: AC
  Administered 2022-11-01: 1 g via INTRAVENOUS
  Filled 2022-11-01: qty 10

## 2022-11-01 MED ORDER — CEFPODOXIME PROXETIL 200 MG PO TABS: 200.0000 mg | ORAL_TABLET | Freq: Two times a day (BID) | ORAL | 0 refills | Status: AC

## 2022-11-01 MED ORDER — ONDANSETRON 8 MG PO TBDP: 8.0000 mg | ORAL_TABLET | Freq: Three times a day (TID) | ORAL | 0 refills | Status: AC | PRN

## 2022-11-01 MED ORDER — ONDANSETRON HCL 4 MG/2ML IJ SOLN
4.0000 mg | Freq: Once | INTRAMUSCULAR | Status: AC
  Administered 2022-11-01: 4 mg via INTRAVENOUS
  Filled 2022-11-01: qty 2

## 2022-11-01 MED ORDER — KETOROLAC TROMETHAMINE 15 MG/ML IJ SOLN
15.0000 mg | Freq: Once | INTRAMUSCULAR | Status: AC
  Administered 2022-11-01: 15 mg via INTRAVENOUS
  Filled 2022-11-01: qty 1

## 2022-11-01 NOTE — ED Provider Notes (Signed)
[facility] Provider Note: Patricia Dell, MD, FACEP  CSN: [id number] MRN: [id number] ARRIVAL: 10/31/22 at 2337 ROOM: DB016/DB016   CHIEF COMPLAINT  Vomiting and Hematuria   HISTORY OF PRESENT ILLNESS  11/01/22 2:33 AM Patricia Ramsey is a 57 y.o. female with nausea and vomiting that started 3 days ago.  She has not been able to keep anything down including her medications.  She denies associated diarrhea.  Yesterday she noticed blood in her urine.  She is not having burning with urination but feels some slight discomfort ("like a bubble").  She denies back pain or flank pain but is having some intermittent left lower quadrant pain that is not severe.   Past Medical History:  Diagnosis Date   Asthma    Diabetes (HCC)    boarderline, pt stopped Glucophage   GERD (gastroesophageal reflux disease)    Hypertension    no meds   Pulmonary embolism (HCC)    Sleep apnea     Past Surgical History:  Procedure Laterality Date   ANKLE SURGERY     CHONDROPLASTY Right 09/21/2014   Procedure: CHONDROPLASTY right knee;  Surgeon: Eldred Manges, MD;  Location: [facility];  Service: Orthopedics;  Laterality: Right;   DORSAL COMPARTMENT RELEASE Left 08/30/2020   Procedure: LEFT FIRST DORSAL COMPARTMENT (DEQUERVAIN RELEASE;  Surgeon: Eldred Manges, MD;  Location: [facility];  Service: Orthopedics;  Laterality: Left;   KNEE ARTHROSCOPY Right 09/21/2014   Procedure:  right knee arthroscopy;  Surgeon: Eldred Manges, MD;  Location: [facility];  Service: Orthopedics;  Laterality: Right;   LITHOTRIPSY     oophrectomy     TUBAL LIGATION      Family History  Problem Relation Age of Onset   Heart failure Mother    Hypertension Mother     Social History   Tobacco Use   Smoking status: Never   Smokeless tobacco: Never  Vaping Use   Vaping Use: Never used  Substance Use Topics   Alcohol use: No   Drug use: No    Prior to Admission  medications   Medication Sig Start Date End Date Taking? Authorizing Provider  cefpodoxime (VANTIN) 200 MG tablet Take 1 tablet (200 mg total) by mouth 2 (two) times daily for 7 days. 11/01/22 11/08/22 Yes Elye Harmsen, MD  ondansetron (ZOFRAN-ODT) 8 MG disintegrating tablet Take 1 tablet (8 mg total) by mouth every 8 (eight) hours as needed for nausea or vomiting. 11/01/22  Yes Rabecka Brendel, MD  albuterol (PROAIR HFA) 108 (90 Base) MCG/ACT inhaler Inhale 2 puffs into the lungs every 6 (six) hours as needed for wheezing or shortness of breath. 2 puffs every 4 hours as needed only  if your can't catch your breath 07/09/21   Coralyn Helling, MD  albuterol (PROVENTIL) (2.5 MG/3ML) 0.083% nebulizer solution Take 3 mLs (2.5 mg total) by nebulization every 6 (six) hours as needed for wheezing or shortness of breath. INHALE 3 MLS VIA NEBULIZER EVERY 6 HOURS AS NEEDED FOR WHEEZING AND SHORTNESS OF BREATH 06/02/22   Coralyn Helling, MD  ALPRAZolam Prudy Feeler) 0.5 MG tablet Take 0.5 mg by mouth at bedtime. 12/24/16   [provider]  amLODipine (NORVASC) 5 MG tablet Take 5 mg by mouth daily.  07/04/17   [provider]  apixaban (ELIQUIS) 5 MG TABS tablet Take 1 tablet (5 mg total) by mouth 2 (two) times daily. 06/24/15   Nyoka Cowden, MD  citalopram (CELEXA)  20 MG tablet Take 20 mg by mouth daily. 04/08/21   [provider]  famotidine (PEPCID) 20 MG tablet Take 1 tablet (20 mg total) by mouth at bedtime. 11/25/18   Coralyn Helling, MD  FLUoxetine (PROZAC) 20 MG capsule Take 20 mg by mouth daily. 01/08/18   [provider]  gabapentin (NEURONTIN) 300 MG capsule Take 1 capsule (300 mg total) by mouth 3 (three) times daily. 10/13/14   Richarda Overlie, MD  hydrochlorothiazide (HYDRODIURIL) 25 MG tablet Take 25 mg by mouth daily. 02/07/15   [provider]  HYDROcodone-acetaminophen (NORCO) 5-325 MG tablet Take 1-2 tablets by mouth every 4 (four) hours as needed for moderate pain. 08/30/20   Eldred Manges, MD  meclizine (ANTIVERT) 25 MG tablet Take 1 tablet (25 mg total) by mouth 3 (three) times daily as needed for dizziness. 05/10/21   Tomi Bamberger, PA-C  Misc. Devices MISC by Does not apply route at bedtime. CPAP machine    [provider]  mometasone (NASONEX) 50 MCG/ACT nasal spray Place 2 sprays into the nose daily as needed. 03/16/19   Glenford Bayley, NP  mometasone-formoterol (DULERA) 100-5 MCG/ACT AERO Inhale 2 puffs into the lungs 2 (two) times daily. INHALE 2 PUFFS BY MOUTH FIRST THING IN THE AM AND THEN ANOTHER 2 PUFFS ABOUT 12 HOURS LATER 06/02/22   Coralyn Helling, MD  montelukast (SINGULAIR) 10 MG tablet TAKE 1 TABLET BY MOUTH AT BEDTIME 05/16/20   Coralyn Helling, MD  OZEMPIC, 1 MG/DOSE, 4 MG/3ML SOPN INJECT 1 MG INTO THE SKIN EVERY WEEK 12/26/19   [provider]  pantoprazole (PROTONIX) 40 MG tablet take 1 tablet by mouth once daily 30 TO 60 MINUTES BEFORE THE FIRST MEAL OF THE DAY 03/06/20   Coralyn Helling, MD  rosuvastatin (CRESTOR) 20 MG tablet TK 1 T PO QD 01/08/18   [provider]  Semaglutide, 2 MG/DOSE, (OZEMPIC, 2 MG/DOSE,) 8 MG/3ML SOPN Inject 2 mg into the skin once a week. 10/16/22     tiZANidine (ZANAFLEX) 2 MG tablet Take 1 tablet (2 mg total) by mouth every 8 (eight) hours as needed for muscle spasms. 02/11/21   Rhys Martini, PA-C  valACYclovir (VALTREX) 1000 MG tablet Take 1,000 mg by mouth 3 (three) times daily. 06/27/20   [provider]  VRAYLAR 4.5 MG CAPS Take 1 capsule by mouth daily. 05/29/21   [provider]  sodium chloride (OCEAN) 0.65 % SOLN nasal spray Place 2 sprays into both nostrils as needed for congestion. 05/30/19 02/14/20  Glenford Bayley, NP    Allergies Patient has no known allergies.   REVIEW OF SYSTEMS  Negative except as noted here or in the History of Present Illness.   PHYSICAL EXAMINATION  Initial Vital Signs Blood pressure (!) 123/90, pulse 77, temperature 98.5 F (36.9 C), temperature  source Oral, resp. rate 18, SpO2 97 %.  Examination General: Well-developed, well-nourished female in no acute distress; appearance consistent with age of record HENT: normocephalic; atraumatic Eyes: Normal appearance Neck: supple Heart: regular rate and rhythm Lungs: clear to auscultation bilaterally Abdomen: soft; nondistended; nontender; bowel sounds present Extremities: No deformity; full range of motion; pulses normal Neurologic: Awake, alert and oriented; motor function intact in all extremities and symmetric; no facial droop Skin: Warm and dry Psychiatric: Normal mood and affect   RESULTS  Summary of this visit's results, reviewed and interpreted by myself:   EKG Interpretation  Date/Time:    Ventricular Rate:  PR Interval:    QRS Duration:   QT Interval:    QTC Calculation:   R Axis:     Text Interpretation:         Laboratory Studies: Results for orders placed or performed during the hospital encounter of 11/01/22 (from the past 24 hour(s))  Urinalysis, Routine w reflex microscopic -Urine, Clean Catch     Status: Abnormal   Collection Time: 10/31/22 11:43 PM  Result Value Ref Range   Color, Urine YELLOW YELLOW   APPearance CLEAR CLEAR   Specific Gravity, Urine 1.033 (H) 1.005 - 1.030   pH 5.5 5.0 - 8.0   Glucose, UA NEGATIVE NEGATIVE mg/dL   Hgb urine dipstick LARGE (A) NEGATIVE   Bilirubin Urine NEGATIVE NEGATIVE   Ketones, ur NEGATIVE NEGATIVE mg/dL   Protein, ur 30 (A) NEGATIVE mg/dL   Nitrite NEGATIVE NEGATIVE   Leukocytes,Ua SMALL (A) NEGATIVE   RBC / HPF >50 0 - 5 RBC/hpf   WBC, UA 11-20 0 - 5 WBC/hpf   Bacteria, UA MANY (A) NONE SEEN   Squamous Epithelial / HPF 0-5 0 - 5 /HPF   Mucus PRESENT   Lipase, blood     Status: Abnormal   Collection Time: 10/31/22 11:44 PM  Result Value Ref Range   Lipase <10 (L) 11 - 51 U/L  Comprehensive metabolic panel     Status: None   Collection Time: 10/31/22 11:44 PM  Result Value Ref Range   Sodium 140  135 - 145 mmol/L   Potassium 3.6 3.5 - 5.1 mmol/L   Chloride 106 98 - 111 mmol/L   CO2 26 22 - 32 mmol/L   Glucose, Bld 87 70 - 99 mg/dL   BUN 11 6 - 20 mg/dL   Creatinine, Ser 1.61 0.44 - 1.00 mg/dL   Calcium 9.4 8.9 - 09.6 mg/dL   Total Protein 7.5 6.5 - 8.1 g/dL   Albumin 4.3 3.5 - 5.0 g/dL   AST 17 15 - 41 U/L   ALT 12 0 - 44 U/L   Alkaline Phosphatase 81 38 - 126 U/L   Total Bilirubin 0.4 0.3 - 1.2 mg/dL   GFR, Estimated >04 >54 mL/min   Anion gap 8 5 - 15  CBC     Status: None   Collection Time: 10/31/22 11:44 PM  Result Value Ref Range   WBC 7.6 4.0 - 10.5 K/uL   RBC 4.84 3.87 - 5.11 MIL/uL   Hemoglobin 14.1 12.0 - 15.0 g/dL   HCT 09.8 11.9 - 14.7 %   MCV 88.0 80.0 - 100.0 fL   MCH 29.1 26.0 - 34.0 pg   MCHC 33.1 30.0 - 36.0 g/dL   RDW 82.9 56.2 - 13.0 %   Platelets 269 150 - 400 K/uL   nRBC 0.0 0.0 - 0.2 %   Imaging Studies: CT Renal Stone Study  Result Date: 11/01/2022 CLINICAL DATA:  Abdominal pain. Stone suspected. Blood-tinged urine. The symptom onset 3 days ago. EXAM: CT ABDOMEN AND PELVIS WITHOUT CONTRAST TECHNIQUE: Multidetector CT imaging of the abdomen and pelvis was performed following the standard protocol without IV contrast. RADIATION DOSE REDUCTION: This exam was performed according to the departmental dose-optimization program which includes automated exposure control, adjustment of the mA and/or kV according to patient size and/or use of iterative reconstruction technique. COMPARISON:  CT without contrast 02/15/2020. FINDINGS: Lower chest: No abnormality. Hepatobiliary: 19 cm liver length. The liver is mildly steatotic. No mass is seen without contrast. The gallbladder and bile  ducts are unremarkable. Pancreas: Unremarkable without contrast. Spleen: Unremarkable without contrast. Adrenals/Urinary Tract: There is no adrenal mass. No focal abnormality in the left kidney. Bosniak 1 cysts are again noted in the right kidney, with adjacent homogeneous cysts in the  upper pole measuring 4.7 cm and -8 Hounsfield units, and 2.1 cm and -12 Hounsfield units. An interpolar cyst measures 2.7 cm and 11 Hounsfield units. A lower pole cyst measures 3.5 cm and 7.5 Hounsfield units. No follow-up imaging is recommended. There is no urinary stone or obstruction. The bladder is unremarkable for the degree of distention. Stomach/Bowel: No dilatation or wall thickening. There is diffuse colonic diverticulosis without diverticulitis. There are no mesenteric inflammatory changes adjacent the small bowel. Vascular/Lymphatic: Trace distal aortic calcification with mild patchy calcification in the iliac arteries. No AAA. No adenopathy. Reproductive: Uterus and bilateral adnexa are unremarkable. Other: Small umbilical fat hernia. No incarcerated hernia. No free hemorrhage, free air or free fluid. There are pelvic phleboliths. Musculoskeletal: Slight dextroscoliosis and degenerative change lumbar spine. Facet hypertrophy with a minimal grade 1 L4-5 anterolisthesis. No acute or other significant osseous findings. No aggressive lesion. IMPRESSION: 1. No acute noncontrast CT findings. Specifically no evidence of urinary stones or obstruction. 2. Diverticulosis without evidence of diverticulitis. 3. Mildly prominent liver with mild steatosis. 4. Trace aortic and mild patchy iliac atherosclerosis. 5. Umbilical fat hernia. 6. Bosniak 1 right renal cysts, slightly larger than in 2021. Electronically Signed   By: Almira Bar M.D.   On: 11/01/2022 03:30    ED COURSE and MDM  Nursing notes, initial and subsequent vitals signs, including pulse oximetry, reviewed and interpreted by myself.  Vitals:   11/01/22 0330 11/01/22 0345 11/01/22 0415 11/01/22 0423  BP: 121/84 132/80 116/75   Pulse: 65 69 77   Resp:    16  Temp:    97.9 F (36.6 C)  TempSrc:    Oral  SpO2: 96% 96% 97%    Medications  sodium chloride 0.9 % bolus 1,000 mL (0 mLs Intravenous Stopped 11/01/22 0422)  ondansetron (ZOFRAN)  injection 4 mg (4 mg Intravenous Given 11/01/22 0306)  metoCLOPramide (REGLAN) injection 10 mg (10 mg Intravenous Given 11/01/22 0354)  cefTRIAXone (ROCEPHIN) 1 g in sodium chloride 0.9 % 100 mL IVPB (0 g Intravenous Stopped 11/01/22 0424)  ketorolac (TORADOL) 15 MG/ML injection 15 mg (15 mg Intravenous Given 11/01/22 0350)   3:38 AM The patient's left lower quadrant pain has gotten worse but is without tenderness.  Her CT scan shows no evidence of urinary tract stones although she has a history of these in the past.  I suspect the hematuria is due to a urinary tract infection that may be causing some pain due to ureteral reflux.  We will start her on Rocephin in the ED and treat her for possible early pyelonephritis as an outpatient.  Urine has been sent for culture.  4:33 AM Pain relieved with IV Toradol.  Able to drink fluids without vomiting.   PROCEDURES  Procedures   ED DIAGNOSES     ICD-10-CM   1. Nausea and vomiting in adult  R11.2     2. Dehydration  E86.0     3. Urinary tract infection with hematuria, site unspecified  N39.0    R31.9          Viraat Vanpatten, Jonny Ruiz, MD 11/01/22 [phone]

## 2022-11-02 LAB — URINE CULTURE: Culture: NO GROWTH

## 2022-12-18 ENCOUNTER — Other Ambulatory Visit: Payer: Self-pay

## 2022-12-21 ENCOUNTER — Other Ambulatory Visit (HOSPITAL_BASED_OUTPATIENT_CLINIC_OR_DEPARTMENT_OTHER): Payer: Self-pay

## 2022-12-23 ENCOUNTER — Other Ambulatory Visit (HOSPITAL_BASED_OUTPATIENT_CLINIC_OR_DEPARTMENT_OTHER): Payer: Self-pay

## 2022-12-28 ENCOUNTER — Other Ambulatory Visit (HOSPITAL_BASED_OUTPATIENT_CLINIC_OR_DEPARTMENT_OTHER): Payer: Self-pay

## 2023-01-01 ENCOUNTER — Other Ambulatory Visit: Payer: Self-pay

## 2023-01-14 ENCOUNTER — Other Ambulatory Visit (HOSPITAL_BASED_OUTPATIENT_CLINIC_OR_DEPARTMENT_OTHER): Payer: Self-pay

## 2023-04-12 ENCOUNTER — Other Ambulatory Visit (HOSPITAL_BASED_OUTPATIENT_CLINIC_OR_DEPARTMENT_OTHER): Payer: Self-pay

## 2024-01-07 ENCOUNTER — Encounter (HOSPITAL_BASED_OUTPATIENT_CLINIC_OR_DEPARTMENT_OTHER): Payer: Self-pay

## 2024-01-07 ENCOUNTER — Emergency Department (HOSPITAL_BASED_OUTPATIENT_CLINIC_OR_DEPARTMENT_OTHER): Admitting: Radiology

## 2024-01-07 ENCOUNTER — Emergency Department (HOSPITAL_BASED_OUTPATIENT_CLINIC_OR_DEPARTMENT_OTHER)
Admission: EM | Admit: 2024-01-07 | Discharge: 2024-01-07 | Disposition: A | Discharge status: Home / Self Care | Attending: Emergency Medicine | Admitting: Emergency Medicine

## 2024-01-07 ENCOUNTER — Emergency Department (HOSPITAL_BASED_OUTPATIENT_CLINIC_OR_DEPARTMENT_OTHER)

## 2024-01-07 ENCOUNTER — Other Ambulatory Visit: Payer: Self-pay

## 2024-01-07 DIAGNOSIS — M7121 Synovial cyst of popliteal space [Baker], right knee: Secondary | ICD-10-CM | POA: Diagnosis not present

## 2024-01-07 DIAGNOSIS — Z86711 Personal history of pulmonary embolism: Secondary | ICD-10-CM | POA: Insufficient documentation

## 2024-01-07 DIAGNOSIS — R079 Chest pain, unspecified: Secondary | ICD-10-CM | POA: Diagnosis present

## 2024-01-07 DIAGNOSIS — Z7901 Long term (current) use of anticoagulants: Secondary | ICD-10-CM | POA: Insufficient documentation

## 2024-01-07 DIAGNOSIS — J40 Bronchitis, not specified as acute or chronic: Secondary | ICD-10-CM | POA: Diagnosis not present

## 2024-01-07 LAB — BASIC METABOLIC PANEL WITH GFR
Anion gap: 11 (ref 5–15)
BUN: 9 mg/dL (ref 6–20)
CO2: 24 mmol/L (ref 22–32)
Calcium: 9.5 mg/dL (ref 8.9–10.3)
Chloride: 104 mmol/L (ref 98–111)
Creatinine, Ser: 0.85 mg/dL (ref 0.44–1.00)
GFR, Estimated: >60 mL/min (ref >60)
Glucose, Bld: 90 mg/dL (ref 70–99)
Potassium: 3.9 mmol/L (ref 3.5–5.1)
Sodium: 140 mmol/L (ref 135–145)

## 2024-01-07 LAB — CBC
HCT: 39 % (ref 36.0–46.0)
Hemoglobin: 12.8 g/dL (ref 12.0–15.0)
MCH: 29.2 pg (ref 26.0–34.0)
MCHC: 32.8 g/dL (ref 30.0–36.0)
MCV: 89 fL (ref 80.0–100.0)
Platelets: 236 K/uL (ref 150–400)
RBC: 4.38 MIL/uL (ref 3.87–5.11)
RDW: 14.6 % (ref 11.5–15.5)
WBC: 5.9 K/uL (ref 4.0–10.5)
nRBC: 0 % (ref 0.0–0.2)

## 2024-01-07 LAB — TROPONIN T, HIGH SENSITIVITY
Troponin T High Sensitivity: <15 ng/L (ref <19)
Troponin T High Sensitivity: <15 ng/L (ref <19)

## 2024-01-07 MED ORDER — DIPHENHYDRAMINE HCL 50 MG/ML IJ SOLN
25.0000 mg | Freq: Once | INTRAMUSCULAR | Status: AC
  Administered 2024-01-07: 25 mg via INTRAVENOUS
  Filled 2024-01-07: qty 1

## 2024-01-07 MED ORDER — ALBUTEROL SULFATE (2.5 MG/3ML) 0.083% IN NEBU: 2.5000 mg | INHALATION_SOLUTION | Freq: Four times a day (QID) | RESPIRATORY_TRACT | 12 refills | Status: DC | PRN

## 2024-01-07 MED ORDER — AZITHROMYCIN 250 MG PO TABS: 250.0000 mg | ORAL_TABLET | Freq: Every day | ORAL | 0 refills | Status: DC

## 2024-01-07 MED ORDER — PROCHLORPERAZINE EDISYLATE 10 MG/2ML IJ SOLN
10.0000 mg | Freq: Once | INTRAMUSCULAR | Status: AC
  Administered 2024-01-07: 10 mg via INTRAVENOUS
  Filled 2024-01-07: qty 2

## 2024-01-07 MED ORDER — PREDNISONE 50 MG PO TABS: 50.0000 mg | ORAL_TABLET | Freq: Every day | ORAL | 0 refills | Status: AC

## 2024-01-07 MED ORDER — ALBUTEROL SULFATE HFA 108 (90 BASE) MCG/ACT IN AERS
2.0000 | INHALATION_SPRAY | Freq: Once | RESPIRATORY_TRACT | Status: AC
  Administered 2024-01-07: 2 via RESPIRATORY_TRACT
  Filled 2024-01-07: qty 6.7

## 2024-01-07 MED ORDER — MORPHINE SULFATE (PF) 4 MG/ML IV SOLN
4.0000 mg | Freq: Once | INTRAVENOUS | Status: AC
  Administered 2024-01-07: 4 mg via INTRAVENOUS
  Filled 2024-01-07: qty 1

## 2024-01-07 MED ORDER — PREDNISONE 50 MG PO TABS
60.0000 mg | ORAL_TABLET | Freq: Once | ORAL | Status: AC
  Administered 2024-01-07: 60 mg via ORAL
  Filled 2024-01-07: qty 1

## 2024-01-07 MED ORDER — IOHEXOL 350 MG/ML SOLN
75.0000 mL | Freq: Once | INTRAVENOUS | Status: AC | PRN
  Administered 2024-01-07: 75 mL via INTRAVENOUS

## 2024-01-07 NOTE — ED Provider Notes (Signed)
 [place] [facility] AT [facility] Provider Note   CSN: [id number] Arrival date & time: 01/07/24  1345    Patient presents with: Chest Pain   Patricia Ramsey is a 58 y.o. female here for evaluation of chest pain.  Chest pain over the last week.  Started when she was cleaning.  Intermittent however not necessarily exertional.  Associated mild dizziness 2 days ago.  Feels like she is also had some aching to her right calf.  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  Has also had some intermittent dizziness.  No sudden onset thunderclap headache, numbness, weakness, slurred speech, blurred vision.  Is able to typically do her ADLs any difficulty.  Chest pain pleuritic in nature.  Has been wheezing more than normal.   Per Pulm 2019 PE provoked from surgery --- Lost to FU last visit 06/2022   HPI     Prior to Admission medications   Medication Sig Start Date End Date Taking? Authorizing Provider  albuterol  (PROVENTIL ) (2.5 MG/3ML) 0.083% nebulizer solution Take 3 mLs (2.5 mg total) by nebulization every 6 (six) hours as needed for wheezing or shortness of breath. 01/07/24  Yes Julien Oscar A, PA-C  azithromycin  (ZITHROMAX ) 250 MG tablet Take 1 tablet (250 mg total) by mouth daily. Take first 2 tablets together, then 1 every day until finished. 01/07/24  Yes Jaquisha Frech A, PA-C  predniSONE  (DELTASONE ) 50 MG tablet Take 1 tablet (50 mg total) by mouth daily for 4 days. 01/07/24 01/11/24 Yes Zeola Brys A, PA-C  ALPRAZolam (XANAX) 0.5 MG tablet Take 0.5 mg by mouth at bedtime. 12/24/16   [provider]  amLODipine (NORVASC) 5 MG tablet Take 5 mg by mouth daily.  07/04/17   [provider]  apixaban  (ELIQUIS ) 5 MG TABS tablet Take 1 tablet (5 mg total) by mouth 2 (two) times daily. 06/24/15   Darlean Ozell NOVAK, MD  citalopram (CELEXA) 20 MG tablet Take 20 mg by mouth daily. 04/08/21   [provider]  famotidine   (PEPCID ) 20 MG tablet Take 1 tablet (20 mg total) by mouth at bedtime. 11/25/18   Sood, Vineet, MD  FLUoxetine (PROZAC) 20 MG capsule Take 20 mg by mouth daily. 01/08/18   [provider]  gabapentin  (NEURONTIN ) 300 MG capsule Take 1 capsule (300 mg total) by mouth 3 (three) times daily. 10/13/14   Abrol, Nayana, MD  hydrochlorothiazide (HYDRODIURIL) 25 MG tablet Take 25 mg by mouth daily. 02/07/15   [provider]  HYDROcodone -acetaminophen  (NORCO) 5-325 MG tablet Take 1-2 tablets by mouth every 4 (four) hours as needed for moderate pain. 08/30/20   Barbarann Oneil BROCKS, MD  meclizine  (ANTIVERT ) 25 MG tablet Take 1 tablet (25 mg total) by mouth 3 (three) times daily as needed for dizziness. 05/10/21   Billy Asberry FALCON, PA-C  Misc. Devices MISC by Does not apply route at bedtime. CPAP machine    [provider]  mometasone  (NASONEX ) 50 MCG/ACT nasal spray Place 2 sprays into the nose daily as needed. 03/16/19   Hope Almarie ORN, NP  mometasone -formoterol  (DULERA ) 100-5 MCG/ACT AERO Inhale 2 puffs into the lungs 2 (two) times daily. INHALE 2 PUFFS BY MOUTH FIRST THING IN THE AM AND THEN ANOTHER 2 PUFFS ABOUT 12 HOURS LATER 06/02/22   Sood, Vineet, MD  montelukast  (SINGULAIR ) 10 MG tablet TAKE 1 TABLET BY MOUTH AT BEDTIME 05/16/20   Sood, Vineet, MD  ondansetron  (ZOFRAN -ODT) 8 MG disintegrating tablet Take  1 tablet (8 mg total) by mouth every 8 (eight) hours as needed for nausea or vomiting. 11/01/22   Molpus, John, MD  OZEMPIC , 1 MG/DOSE, 4 MG/3ML SOPN INJECT 1 MG INTO THE SKIN EVERY WEEK 12/26/19   [provider]  pantoprazole  (PROTONIX ) 40 MG tablet take 1 tablet by mouth once daily 30 TO 60 MINUTES BEFORE THE FIRST MEAL OF THE DAY 03/06/20   Sood, Vineet, MD  rosuvastatin (CRESTOR) 20 MG tablet TK 1 T PO QD 01/08/18   [provider]  Semaglutide , 2 MG/DOSE, (OZEMPIC , 2 MG/DOSE,) 8 MG/3ML SOPN Inject 2 mg into the skin once a week. 10/16/22     tiZANidine  (ZANAFLEX ) 2 MG  tablet Take 1 tablet (2 mg total) by mouth every 8 (eight) hours as needed for muscle spasms. 02/11/21   Graham, Laura E, PA-C  valACYclovir (VALTREX) 1000 MG tablet Take 1,000 mg by mouth 3 (three) times daily. 06/27/20   [provider]  VRAYLAR 4.5 MG CAPS Take 1 capsule by mouth daily. 05/29/21   [provider]  sodium chloride  (OCEAN) 0.65 % SOLN nasal spray Place 2 sprays into both nostrils as needed for congestion. 05/30/19 02/14/20  Hope Almarie ORN, NP    Allergies: Patient has no known allergies.    Review of Systems  Constitutional: Negative.   HENT: Negative.    Respiratory:  Positive for wheezing.   Cardiovascular:  Positive for chest pain.  Gastrointestinal: Negative.   Genitourinary: Negative.   Musculoskeletal: Negative.   Skin: Negative.   Neurological:  Positive for dizziness. Negative for tremors, seizures, syncope, facial asymmetry, speech difficulty, weakness, light-headedness, numbness and headaches.  All other systems reviewed and are negative.   Updated Vital Signs BP 125/79   Pulse 64   Temp 98.2 F (36.8 C) (Oral)   Resp 14   Ht 5' 3 (1.6 m)   Wt 97.5 kg   SpO2 97%   BMI 38.09 kg/m   Physical Exam Vitals and nursing note reviewed.  Constitutional:      General: She is not in acute distress.    Appearance: She is well-developed. She is not ill-appearing, toxic-appearing or diaphoretic.  HENT:     Head: Atraumatic.  Eyes:     Pupils: Pupils are equal, round, and reactive to light.  Cardiovascular:     Rate and Rhythm: Normal rate.     Pulses:          Radial pulses are 2+ on the right side and 2+ on the left side.       Dorsalis pedis pulses are 2+ on the right side and 2+ on the left side.     Heart sounds: Normal heart sounds.  Pulmonary:     Effort: Pulmonary effort is normal. No respiratory distress.     Breath sounds: Wheezing present.  Chest:     Chest wall: No mass, tenderness or crepitus.  Abdominal:     General:  Bowel sounds are normal. There is no distension.     Palpations: Abdomen is soft. There is no mass.     Tenderness: There is no abdominal tenderness. There is no guarding or rebound.  Musculoskeletal:        General: Normal range of motion.     Cervical back: Normal range of motion.     Comments: Mild tenderness right lower extremity without edema, erythema, warmth.  Compartments soft bilaterally.  Skin:    General: Skin is warm and dry.  Capillary Refill: Capillary refill takes less than 2 seconds.     Comments: No obvious rashes or lesions on exposed skin  Neurological:     General: No focal deficit present.     Mental Status: She is alert.     Cranial Nerves: Cranial nerves 2-12 are intact.     Sensory: Sensation is intact.     Motor: Motor function is intact.     Coordination: Coordination is intact.     Gait: Gait is intact.  Psychiatric:        Mood and Affect: Mood normal.     (all labs ordered are listed, but only abnormal results are displayed) Labs Reviewed  BASIC METABOLIC PANEL WITH GFR  CBC  TROPONIN T, HIGH SENSITIVITY  TROPONIN T, HIGH SENSITIVITY    EKG: EKG Interpretation Date/Time:  Friday January 07 2024 13:56:43 EDT Ventricular Rate:  66 PR Interval:  164 QRS Duration:  93 QT Interval:  386 QTC Calculation: 405 R Axis:   40  Text Interpretation: Sinus rhythm Low voltage, precordial leads Abnormal R-wave progression, early transition Borderline T abnormalities, anterior leads Confirmed by Freddi Hamilton [phone]) on 01/07/2024 2:00:49 PM  Radiology: US  Venous Img Lower Unilateral Right Result Date: 01/07/2024 CLINICAL DATA:  History of DVT. EXAM: RIGHT LOWER EXTREMITY VENOUS DOPPLER ULTRASOUND TECHNIQUE: Gray-scale sonography with compression, as well as color and duplex ultrasound, were performed to evaluate the deep venous system(s) from the level of the common femoral vein through the popliteal and proximal calf veins. COMPARISON:  None Available.  FINDINGS: VENOUS Normal compressibility of the common femoral, superficial femoral, and popliteal veins, as well as the visualized calf veins. Visualized portions of profunda femoral vein and great saphenous vein unremarkable. No filling defects to suggest DVT on grayscale or color Doppler imaging. Doppler waveforms show normal direction of venous flow, normal respiratory plasticity and response to augmentation. Limited views of the contralateral common femoral vein are unremarkable. OTHER There is a 5.4 x 0.6 x 1.8 cm fluid collection in the popliteal fossa. Limitations: none IMPRESSION: 1. No evidence of right lower extremity DVT. 2. 5.4 x 0.6 x 1.8 cm fluid collection in the popliteal fossa, likely a Baker's cyst. Electronically Signed   By: Greig Pique M.D.   On: 01/07/2024 15:23   CT Angio Chest PE W and/or Wo Contrast Result Date: 01/07/2024 EXAM: CTA of the Chest with contrast for PE 01/07/2024 02:51:50 PM TECHNIQUE: CTA of the chest was performed after the administration of intravenous contrast. Multiplanar reformatted images are provided for review. MIP images are provided for review. Automated exposure control, iterative reconstruction, and/or weight based adjustment of the mA/kV was utilized to reduce the radiation dose to as low as reasonably achievable. COMPARISON: Same day chest radiograph and CTA chest dated 03/01/2015. CLINICAL HISTORY: Pulmonary embolism (PE) suspected, high prob. Mid chest pain x 1 week, dizziness x 2 days. FINDINGS: PULMONARY ARTERIES: Pulmonary arteries are adequately opacified for evaluation. No pulmonary embolism. Unchanged somewhat tortuous appearance of the left lower lobe pulmonary arteries. Main pulmonary artery is normal in caliber. MEDIASTINUM: The heart and pericardium demonstrate no acute abnormality. There is no acute abnormality of the thoracic aorta. Coronary artery calcification. LYMPH NODES: No mediastinal, hilar or axillary lymphadenopathy. LUNGS AND PLEURA:  Mild diffuse mosaic attenuation. Mild diffuse bronchial wall thickening with bilateral lower lobe subsegmental mucus plugging. No focal consolidation or pulmonary edema. No pleural effusion or pneumothorax. UPPER ABDOMEN: Partially imaged simple-appearing right upper pole renal cysts measuring up to 3.4  cm. SOFT TISSUES AND BONES: Multilevel degenerative changes of the imaged spine. No acute bone or soft tissue abnormality. IMPRESSION: 1. No pulmonary embolism. 2. Findings of small airways disease and bronchitis. 3. Coronary artery calcifications. Electronically signed by: Manford Cummins MD 01/07/2024 03:20 PM EDT RP Workstation: [id number]   DG Chest 2 View Result Date: 01/07/2024 CLINICAL DATA:  chest pain and dizziness EXAM: CHEST - 2 VIEW COMPARISON:  December 28, 2019 FINDINGS: No focal airspace consolidation, pleural effusion, or pneumothorax. No cardiomegaly. No acute fracture or destructive lesion. Multilevel thoracic osteophytosis. IMPRESSION: No acute cardiopulmonary abnormality. Electronically Signed   By: Rogelia Myers M.D.   On: 01/07/2024 14:33     Procedures   Medications Ordered in the ED  morphine  (PF) 4 MG/ML injection 4 mg (4 mg Intravenous Given 01/07/24 1425)  prochlorperazine  (COMPAZINE ) injection 10 mg (10 mg Intravenous Given 01/07/24 1424)  diphenhydrAMINE  (BENADRYL ) injection 25 mg (25 mg Intravenous Given 01/07/24 1424)  iohexol  (OMNIPAQUE ) 350 MG/ML injection 75 mL (75 mLs Intravenous Contrast Given 01/07/24 1446)  predniSONE  (DELTASONE ) tablet 60 mg (60 mg Oral Given 01/07/24 1545)  albuterol  (VENTOLIN  HFA) 108 (90 Base) MCG/ACT inhaler 2 puff (2 puffs Inhalation Given 01/07/24 1542)   Here for evaluation of chest pain over the last week.  None exertional in nature.  Has pleuritic component.  Feels like she has been wheezing more than normal.  She has some pain to her right calf.  No recent surgery, immobilization malignancy.  She has prior history of provoked PE and DVT not currently  anticoagulated.  Mild associated dizziness.  No sudden onset thunderclap headache.  No numbness, weakness, syncope.  No palpitations.    Labs and imaging personally viewed and  interpreted:  CBC without leukocytosis Metabolic panel without significant abnormal Troponin less than 15 Chest x-ray without significant abnormality CTA negative for PE does show bronchitis Ultrasound shows Baker's cyst without DVT EKG without ischemic changes  Patient reassessed. Feels improved with medications here.  Discussed her labs and imaging.  Will treat her for bronchitis, given symptoms for greater than a week will treat for possible bacterial component as well.  I encouraged her to follow back up with pulmonology who she saw previously as well as her PCP.  Low suspicion for acute ACS, PE, dissection, pneumothorax, myocarditis, pericarditis, endocarditis, boerhaave rupture, acute traumatic injury, fluid overload   The patient has been appropriately medically screened and/or stabilized in the ED. I have low suspicion for any other emergent medical condition which would require further screening, evaluation or treatment in the ED or require inpatient management.  Patient is hemodynamically stable and in no acute distress.  Patient able to ambulate in department prior to ED.  Evaluation does not show acute pathology that would require ongoing or additional emergent interventions while in the emergency department or further inpatient treatment.  I have discussed the diagnosis with the patient and answered all questions.  Pain is been managed while in the emergency department and patient has no further complaints prior to discharge.  Patient is comfortable with plan discussed in room and is stable for discharge at this time.  I have discussed strict return precautions for returning to the emergency department.  Patient was encouraged to follow-up with PCP/specialist refer to at discharge.  Medical Decision Making Amount and/or Complexity of Data Reviewed External Data Reviewed: labs and radiology. Labs: ordered. Decision-making details documented in ED Course. Radiology: ordered and independent interpretation performed. Decision-making details documented in ED Course. ECG/medicine tests: ordered and independent interpretation performed. Decision-making details documented in ED Course.  Risk OTC drugs. Prescription drug management. Parenteral controlled substances. Decision regarding hospitalization. Diagnosis or treatment significantly limited by social determinants of health.       Final diagnoses:  Bronchitis  Synovial cyst of right popliteal space  History of pulmonary embolism    ED Discharge Orders          Ordered    predniSONE  (DELTASONE ) 50 MG tablet  Daily        01/07/24 1752    azithromycin  (ZITHROMAX ) 250 MG tablet  Daily        01/07/24 1752    albuterol  (PROVENTIL ) (2.5 MG/3ML) 0.083% nebulizer solution  Every 6 hours PRN        01/07/24 1752               Eliyanah Elgersma A, PA-C 01/07/24 1804    Freddi Hamilton, MD 01/08/24 [phone]

## 2024-01-07 NOTE — Discharge Instructions (Addendum)
 It was a pleasure taking care of you here today  I given you some medications to help with your bronchitis.  Make sure to follow back up with your primary care provider and pulmonologist  Return for any new or worsening symptoms

## 2024-01-07 NOTE — ED Notes (Signed)
 Pt alert and oriented X 4 at the time of discharge. RR even and unlabored. No acute distress noted. Pt verbalized understanding of discharge instructions as discussed. Pt ambulatory to lobby at time of discharge.

## 2024-01-07 NOTE — ED Triage Notes (Signed)
 Pt POV from home c/o central chest pain x 1 week that started when she was cleaning. Pt states 2 days ago she began experiencing dizziness. Denies SOB and NV. CAO

## 2024-01-18 ENCOUNTER — Other Ambulatory Visit: Payer: Self-pay

## 2024-01-18 ENCOUNTER — Other Ambulatory Visit

## 2024-01-18 ENCOUNTER — Ambulatory Visit (INDEPENDENT_AMBULATORY_CARE_PROVIDER_SITE_OTHER): Admitting: Physician Assistant

## 2024-01-18 ENCOUNTER — Encounter: Payer: Self-pay | Admitting: Physician Assistant

## 2024-01-18 VITALS — Wt 243.0 lb

## 2024-01-18 DIAGNOSIS — M25562 Pain in left knee: Secondary | ICD-10-CM

## 2024-01-18 DIAGNOSIS — M1711 Unilateral primary osteoarthritis, right knee: Secondary | ICD-10-CM

## 2024-01-18 DIAGNOSIS — G8929 Other chronic pain: Secondary | ICD-10-CM

## 2024-01-18 DIAGNOSIS — M170 Bilateral primary osteoarthritis of knee: Secondary | ICD-10-CM | POA: Diagnosis not present

## 2024-01-18 DIAGNOSIS — M25561 Pain in right knee: Secondary | ICD-10-CM

## 2024-01-18 MED ORDER — METHYLPREDNISOLONE ACETATE 40 MG/ML IJ SUSP
40.0000 mg | INTRAMUSCULAR | Status: AC | PRN
  Administered 2024-01-18: 40 mg via INTRA_ARTICULAR

## 2024-01-18 MED ORDER — LIDOCAINE HCL (PF) 1 % IJ SOLN
5.0000 mL | INTRAMUSCULAR | Status: AC | PRN
Start: 2024-01-18 — End: 2024-01-18
  Administered 2024-01-18: 5 mL

## 2024-01-18 NOTE — Progress Notes (Signed)
 Office Visit Note   Patient: Patricia Ramsey           Date of Birth: Oct 26, 1965           MRN: [id number] Visit Date: 01/18/2024              Requested by: Benjamine Aland, MD [address] [place],  [place] [address] PCP: Benjamine Aland, MD  Chief Complaint  Patient presents with   Left Knee - Pain   Right Knee - Pain      HPI: 58 y/o female with bilateral knee pain.  She has history of B knee OA and was last seen on 02/07/24 primarily with right knee pain.  She has pain and stiffness right LE > left LE.  The right knee has given away on her due to sharp sudden pain.  Of note she has been working on weight loss with a goal weight of 233.  She is currently at 243 lbs.  She has had primary worsening knee pain for more than 5 years.  She is unable to perform task of daily living due to pain and stiffness with episodes of her right knee giving away.    Assessment & Plan: Visit Diagnoses:  1. Chronic pain of both knees   2. Primary osteoarthritis of both knees     Plan: Right knee injection was given.  She has sever OA with loss of right knee joint space that is interfering with activities of daily living.  She is a good candidate for right total joint replacement.  She will f/u to speak with Dr. Harden.    Follow-Up Instructions: Return in about 1 week (around 01/25/2024).   Ortho Exam  Patient is alert, oriented, no adenopathy, well-dressed, normal affect, normal respiratory effort. Bilateral knee pain with crepitus on active motion Bilaterally.  The right knee is more painful than the left.   No fluctuance, cellulitis or edema.  Right knee medial and lateral joint line tenderness, left medial joint line tenderness.   Imaging: Significant medial joint space narrowing with osteophytes.   on the right knee > left  without subchondral cyst formation.   Labs: Lab Results  Component Value Date   REPTSTATUS 11/02/2022 FINAL 11/01/2022   CULT  11/01/2022    NO GROWTH Performed at  [facility], [address] [place], [place] [address]      Lab Results  Component Value Date   ALBUMIN 4.3 10/31/2022   ALBUMIN 3.2 (L) 05/22/2019   ALBUMIN 4.0 05/04/2019    No results found for: MG No results found for: VD25OH  No results found for: PREALBUMIN    Latest Ref Rng & Units 01/07/2024    2:02 PM 10/31/2022   11:44 PM 01/18/2020    1:57 PM  CBC EXTENDED  WBC 4.0 - 10.5 K/uL 5.9  7.6  8.1   RBC 3.87 - 5.11 MIL/uL 4.38  4.84  4.50   Hemoglobin 12.0 - 15.0 g/dL 87.1  85.8  86.8   HCT 36.0 - 46.0 % 39.0  42.6  39.9   Platelets 150 - 400 K/uL 236  269  271.0   NEUT# 1.4 - 7.7 K/uL   5.1   Lymph# 0.7 - 4.0 K/uL   2.1      Body mass index is 43.05 kg/m.  Orders:  Orders Placed This Encounter  Procedures   Large Joint Inj   XR Knee 1-2 Views Right   XR Knee 1-2 Views  Left   No orders of the defined types were placed in this encounter.    Procedures: Large Joint Inj on 01/18/2024 9:42 AM Indications: pain and diagnostic evaluation Details: 22 G 1.5 in needle  Arthrogram: No  Medications: 5 mL lidocaine  (PF) 1 %; 40 mg methylPREDNISolone  acetate 40 MG/ML Outcome: tolerated well, no immediate complications Procedure, treatment alternatives, risks and benefits explained, specific risks discussed. Consent was given by the patient. Immediately prior to procedure a time out was called to verify the correct patient, procedure, equipment, support staff and site/side marked as required. Patient was prepped and draped in the usual sterile fashion.      Clinical Data: No additional findings.  ROS:  All other systems negative, except as noted in the HPI. Review of Systems  Objective: Vital Signs: Wt 243 lb (110.2 kg)   BMI 43.05 kg/m   Specialty Comments:  No specialty comments available.  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/30/2020   Eosinophilia 01/18/2020    Bilateral primary osteoarthritis of knee 12/16/2018   Acute non-recurrent sinusitis 05/10/2018   Bronchitis 05/10/2018   Acute left-sided low back pain without sciatica 05/10/2018   Chronic low back pain without sciatica 07/28/2016   Cough variant asthma vs UACS 06/24/2015   Morbid obesity due to excess calories (HCC) 02/23/2015   Dyspnea 02/22/2015   Acute pulmonary embolism (HCC) 10/09/2014   HTN (hypertension) 10/09/2014   Sleep apnea in adult 10/09/2014   Chest pain    Past Medical History:  Diagnosis Date   Asthma    Diabetes (HCC)    boarderline, pt stopped Glucophage   GERD (gastroesophageal reflux disease)    Hypertension    no meds   Pulmonary embolism (HCC)    Sleep apnea     Family History  Problem Relation Age of Onset   Heart failure Mother    Hypertension Mother     Past Surgical History:  Procedure Laterality Date   ANKLE SURGERY     CHONDROPLASTY Right 09/21/2014   Procedure: CHONDROPLASTY right knee;  Surgeon: Oneil JAYSON Herald, MD;  Location: [facility];  Service: Orthopedics;  Laterality: Right;   DORSAL COMPARTMENT RELEASE Left 08/30/2020   Procedure: LEFT FIRST DORSAL COMPARTMENT (DEQUERVAIN RELEASE;  Surgeon: Herald Oneil JAYSON, MD;  Location: [facility];  Service: Orthopedics;  Laterality: Left;   KNEE ARTHROSCOPY Right 09/21/2014   Procedure:  right knee arthroscopy;  Surgeon: Oneil JAYSON Herald, MD;  Location: [facility];  Service: Orthopedics;  Laterality: Right;   LITHOTRIPSY     oophrectomy     TUBAL LIGATION     Social History   Occupational History   Occupation: unemployed  Tobacco Use   Smoking status: Never   Smokeless tobacco: Never  Vaping Use   Vaping status: Never Used  Substance and Sexual Activity   Alcohol use: No   Drug use: No   Sexual activity: Not on file

## 2024-01-25 ENCOUNTER — Ambulatory Visit: Admitting: Physician Assistant

## 2024-02-01 ENCOUNTER — Ambulatory Visit: Admitting: Physician Assistant

## 2024-03-29 ENCOUNTER — Encounter: Payer: Self-pay | Admitting: Emergency Medicine

## 2024-03-29 ENCOUNTER — Ambulatory Visit: Admission: EM | Admit: 2024-03-29 | Discharge: 2024-03-29 | Disposition: A | Discharge status: Home / Self Care

## 2024-03-29 DIAGNOSIS — J029 Acute pharyngitis, unspecified: Secondary | ICD-10-CM | POA: Diagnosis present

## 2024-03-29 DIAGNOSIS — R350 Frequency of micturition: Secondary | ICD-10-CM | POA: Insufficient documentation

## 2024-03-29 LAB — POCT URINE DIPSTICK
Bilirubin, UA: NEGATIVE
Blood, UA: NEGATIVE
Glucose, UA: NEGATIVE mg/dL
Ketones, POC UA: NEGATIVE mg/dL
Leukocytes, UA: NEGATIVE
Nitrite, UA: NEGATIVE
POC PROTEIN,UA: NEGATIVE
Spec Grav, UA: 1.02 (ref 1.010–1.025)
Urobilinogen, UA: 0.2 U/dL
pH, UA: 6.5 (ref 5.0–8.0)

## 2024-03-29 LAB — POCT RAPID STREP A (OFFICE): Rapid Strep A Screen: NEGATIVE

## 2024-03-29 MED ORDER — MECLIZINE HCL 25 MG PO TABS: 25.0000 mg | ORAL_TABLET | Freq: Three times a day (TID) | ORAL | 0 refills | Status: AC | PRN

## 2024-03-29 MED ORDER — PHENAZOPYRIDINE HCL 200 MG PO TABS: 200.0000 mg | ORAL_TABLET | Freq: Three times a day (TID) | ORAL | 0 refills | Status: AC

## 2024-03-29 MED ORDER — AZELASTINE HCL 0.1 % NA SOLN: 1.0000 | Freq: Two times a day (BID) | NASAL | 1 refills | Status: AC

## 2024-03-29 NOTE — ED Triage Notes (Addendum)
 Pt c/o sore throat, bilateral ear pain, feeling light her head is spinning, bilateral flank pain and frequent urination for about 3days

## 2024-03-29 NOTE — Discharge Instructions (Signed)
  1. Acute viral pharyngitis (Primary) - POCT rapid strep A complete and UC is negative for strep pharyngitis - azelastine (ASTELIN) 0.1 % nasal spray; Place 1 spray into both nostrils 2 (two) times daily. Use in each nostril as directed  Dispense: 30 mL; Refill: 1 - meclizine  (ANTIVERT ) 25 MG tablet; Take 1 tablet (25 mg total) by mouth 3 (three) times daily as needed for dizziness.  Dispense: 30 tablet; Refill: 0  2. Urinary frequency - POCT URINE DIPSTICK completed in UC shows no leukocytes, nitrite, blood, these findings are not indicative of urinary infection - Urine Culture collected in UC and sent to lab for further testing results should be available in 2 to 3 days. - phenazopyridine (PYRIDIUM) 200 MG tablet; Take 1 tablet (200 mg total) by mouth 3 (three) times daily.  Dispense: 6 tablet; Refill: 0  -Continue to monitor symptoms for any change in severity if there is any escalation of current symptoms or development of new symptoms follow-up in ER for further evaluation and management.

## 2024-03-29 NOTE — ED Provider Notes (Signed)
 [facility]  Note:  This document was prepared using Dragon voice recognition software and may include unintentional dictation errors.  MRN: [id number] DOB: [birth year]  Subjective:   Patricia Ramsey is a 58 y.o. female presenting for evaluation of sore throat, bilateral ear pressure, lightheaded feeling, bilateral flank pain, urinary frequency x 3 to 4 days.  Patient reports that her grandson has been sick with similar symptoms and her daughter just recently told her that she has a sore throat as well.  Patient denies any fever, body aches, fatigue, shortness of breath, chest pain, weakness, dizziness, abdominal pain, flank pain, dysuria.  Patient denies using any over-the-counter medication to treat symptoms prior to arrival today in urgent care.  No current facility-administered medications for this encounter.  Current Outpatient Medications:    azelastine (ASTELIN) 0.1 % nasal spray, Place 1 spray into both nostrils 2 (two) times daily. Use in each nostril as directed, Disp: 30 mL, Rfl: 1   phenazopyridine (PYRIDIUM) 200 MG tablet, Take 1 tablet (200 mg total) by mouth 3 (three) times daily., Disp: 6 tablet, Rfl: 0   albuterol  (PROVENTIL ) (2.5 MG/3ML) 0.083% nebulizer solution, Take 3 mLs (2.5 mg total) by nebulization every 6 (six) hours as needed for wheezing or shortness of breath., Disp: 75 mL, Rfl: 12   ALPRAZolam (XANAX) 0.5 MG tablet, Take 0.5 mg by mouth at bedtime., Disp: , Rfl: 0   amLODipine (NORVASC) 5 MG tablet, Take 5 mg by mouth daily. , Disp: , Rfl: 0   apixaban  (ELIQUIS ) 5 MG TABS tablet, Take 1 tablet (5 mg total) by mouth 2 (two) times daily., Disp: 60 tablet, Rfl: 1   azithromycin  (ZITHROMAX ) 250 MG tablet, Take 1 tablet (250 mg total) by mouth daily. Take first 2 tablets together, then 1 every day until finished., Disp: 6 tablet, Rfl: 0   citalopram (CELEXA) 20 MG tablet, Take 20 mg by mouth daily., Disp: , Rfl:    famotidine  (PEPCID ) 20 MG  tablet, Take 1 tablet (20 mg total) by mouth at bedtime., Disp: 30 tablet, Rfl: 5   FLUoxetine (PROZAC) 20 MG capsule, Take 20 mg by mouth daily., Disp: , Rfl: 3   gabapentin  (NEURONTIN ) 300 MG capsule, Take 1 capsule (300 mg total) by mouth 3 (three) times daily., Disp: 30 capsule, Rfl: 0   hydrochlorothiazide (HYDRODIURIL) 25 MG tablet, Take 25 mg by mouth daily., Disp: , Rfl: 0   HYDROcodone -acetaminophen  (NORCO) 5-325 MG tablet, Take 1-2 tablets by mouth every 4 (four) hours as needed for moderate pain., Disp: 30 tablet, Rfl: 0   meclizine  (ANTIVERT ) 25 MG tablet, Take 1 tablet (25 mg total) by mouth 3 (three) times daily as needed for dizziness., Disp: 30 tablet, Rfl: 0   Misc. Devices MISC, by Does not apply route at bedtime. CPAP machine, Disp: , Rfl:    mometasone  (NASONEX ) 50 MCG/ACT nasal spray, Place 2 sprays into the nose daily as needed., Disp: 17 g, Rfl: 5   mometasone -formoterol  (DULERA ) 100-5 MCG/ACT AERO, Inhale 2 puffs into the lungs 2 (two) times daily. INHALE 2 PUFFS BY MOUTH FIRST THING IN THE AM AND THEN ANOTHER 2 PUFFS ABOUT 12 HOURS LATER, Disp: 13 g, Rfl: 5   montelukast  (SINGULAIR ) 10 MG tablet, TAKE 1 TABLET BY MOUTH AT BEDTIME, Disp: 30 tablet, Rfl: 5   ondansetron  (ZOFRAN -ODT) 8 MG disintegrating tablet, Take 1 tablet (8 mg total) by mouth every 8 (eight) hours as needed for nausea or vomiting., Disp: 10 tablet, Rfl: 0  OZEMPIC , 1 MG/DOSE, 4 MG/3ML SOPN, INJECT 1 MG INTO THE SKIN EVERY WEEK, Disp: , Rfl:    pantoprazole  (PROTONIX ) 40 MG tablet, take 1 tablet by mouth once daily 30 TO 60 MINUTES BEFORE THE FIRST MEAL OF THE DAY, Disp: 30 tablet, Rfl: 5   rosuvastatin (CRESTOR) 20 MG tablet, TK 1 T PO QD, Disp: , Rfl: 2   Semaglutide , 2 MG/DOSE, (OZEMPIC , 2 MG/DOSE,) 8 MG/3ML SOPN, Inject 2 mg into the skin once a week., Disp: 6 mL, Rfl: 0   tiZANidine  (ZANAFLEX ) 2 MG tablet, Take 1 tablet (2 mg total) by mouth every 8 (eight) hours as needed for muscle spasms., Disp: 21  tablet, Rfl: 0   valACYclovir (VALTREX) 1000 MG tablet, Take 1,000 mg by mouth 3 (three) times daily., Disp: , Rfl:    VRAYLAR 4.5 MG CAPS, Take 1 capsule by mouth daily., Disp: , Rfl:    No Known Allergies  Past Medical History:  Diagnosis Date   Asthma    Diabetes (HCC)    boarderline, pt stopped Glucophage   GERD (gastroesophageal reflux disease)    Hypertension    no meds   Pulmonary embolism (HCC)    Sleep apnea      Past Surgical History:  Procedure Laterality Date   ANKLE SURGERY     CHONDROPLASTY Right 09/21/2014   Procedure: CHONDROPLASTY right knee;  Surgeon: Oneil JAYSON Herald, MD;  Location: [facility];  Service: Orthopedics;  Laterality: Right;   DORSAL COMPARTMENT RELEASE Left 08/30/2020   Procedure: LEFT FIRST DORSAL COMPARTMENT (DEQUERVAIN RELEASE;  Surgeon: Herald Oneil JAYSON, MD;  Location: [facility];  Service: Orthopedics;  Laterality: Left;   KNEE ARTHROSCOPY Right 09/21/2014   Procedure:  right knee arthroscopy;  Surgeon: Oneil JAYSON Herald, MD;  Location: [facility];  Service: Orthopedics;  Laterality: Right;   LITHOTRIPSY     oophrectomy     TUBAL LIGATION      Family History  Problem Relation Age of Onset   Heart failure Mother    Hypertension Mother     Social History   Tobacco Use   Smoking status: Never   Smokeless tobacco: Never  Vaping Use   Vaping status: Never Used  Substance Use Topics   Alcohol use: No   Drug use: No    ROS Refer to HPI for ROS details.  Objective:   Vitals: BP (!) 138/91 (BP Location: Right Arm)   Pulse 63   Temp 98.4 F (36.9 C) (Oral)   Resp 17   SpO2 95%   Physical Exam Vitals and nursing note reviewed.  Constitutional:      General: She is not in acute distress.    Appearance: Normal appearance. She is well-developed. She is not ill-appearing or toxic-appearing.  HENT:     Head: Normocephalic and atraumatic.     Right Ear: Ear canal and external ear normal. No swelling  or tenderness. A middle ear effusion is present. Tympanic membrane is not injected, erythematous or bulging.     Left Ear: Ear canal and external ear normal. No swelling or tenderness. A middle ear effusion is present. Tympanic membrane is not injected, erythematous or bulging.     Nose: No congestion or rhinorrhea.     Mouth/Throat:     Mouth: Mucous membranes are moist.     Pharynx: Oropharynx is clear. No oropharyngeal exudate or posterior oropharyngeal erythema.  Eyes:     Extraocular Movements: Extraocular movements intact.  Conjunctiva/sclera: Conjunctivae normal.  Cardiovascular:     Rate and Rhythm: Normal rate.  Pulmonary:     Effort: Pulmonary effort is normal. No respiratory distress.     Breath sounds: No stridor. No wheezing.  Abdominal:     Palpations: Abdomen is soft.     Tenderness: There is no abdominal tenderness. There is no right CVA tenderness or left CVA tenderness.  Skin:    General: Skin is warm and dry.  Neurological:     General: No focal deficit present.     Mental Status: She is alert and oriented to person, place, and time.  Psychiatric:        Mood and Affect: Mood normal.        Behavior: Behavior normal.     Procedures  Results for orders placed or performed during the hospital encounter of 03/29/24 (from the past 24 hours)  POCT URINE DIPSTICK     Status: None   Collection Time: 03/29/24 12:53 PM  Result Value Ref Range   Color, UA yellow yellow   Clarity, UA clear clear   Glucose, UA negative negative mg/dL   Bilirubin, UA negative negative   Ketones, POC UA negative negative mg/dL   Spec Grav, UA 8.979 8.989 - 1.025   Blood, UA negative negative   pH, UA 6.5 5.0 - 8.0   POC PROTEIN,UA negative negative, trace   Urobilinogen, UA 0.2 0.2 or 1.0 E.U./dL   Nitrite, UA Negative Negative   Leukocytes, UA Negative Negative  POCT rapid strep A     Status: None   Collection Time: 03/29/24 12:53 PM  Result Value Ref Range   Rapid Strep A  Screen Negative Negative    No results found.   Assessment and Plan :     Discharge Instructions       1. Acute viral pharyngitis (Primary) - POCT rapid strep A complete and UC is negative for strep pharyngitis - azelastine (ASTELIN) 0.1 % nasal spray; Place 1 spray into both nostrils 2 (two) times daily. Use in each nostril as directed  Dispense: 30 mL; Refill: 1 - meclizine  (ANTIVERT ) 25 MG tablet; Take 1 tablet (25 mg total) by mouth 3 (three) times daily as needed for dizziness.  Dispense: 30 tablet; Refill: 0  2. Urinary frequency - POCT URINE DIPSTICK completed in UC shows no leukocytes, nitrite, blood, these findings are not indicative of urinary infection - Urine Culture collected in UC and sent to lab for further testing results should be available in 2 to 3 days. - phenazopyridine (PYRIDIUM) 200 MG tablet; Take 1 tablet (200 mg total) by mouth 3 (three) times daily.  Dispense: 6 tablet; Refill: 0  -Continue to monitor symptoms for any change in severity if there is any escalation of current symptoms or development of new symptoms follow-up in ER for further evaluation and management.      Jamarrion Budai B Halley Shepheard   [place], [place], [place] 03/29/24 1304

## 2024-03-30 LAB — URINE CULTURE
Culture: NO GROWTH
Special Requests: NORMAL

## 2024-03-31 ENCOUNTER — Ambulatory Visit (HOSPITAL_COMMUNITY): Payer: Self-pay

## 2024-04-03 ENCOUNTER — Encounter: Payer: Self-pay | Admitting: Radiology

## 2024-04-06 ENCOUNTER — Ambulatory Visit (HOSPITAL_BASED_OUTPATIENT_CLINIC_OR_DEPARTMENT_OTHER): Payer: Self-pay

## 2024-04-06 NOTE — Telephone Encounter (Signed)
 E2C2 Pulmonary Triage - Initial Assessment Questions "Chief Complaint (e.g., cough, sob, wheezing, fever, chills, sweat or additional symptoms) *Go to specific symptom protocol after initial questions. Cough   "How long have symptoms been present?" 2-3 days   Have you tested for COVID or Flu? Note: If not, ask patient if a home test can be taken. If so, instruct patient to call back for positive results. No  MEDICINES:   "Have you used any OTC meds to help with symptoms?" Yes If yes, ask "What medications?" Allergy medicine   "Have you used your inhalers/maintenance medication?" No If yes, "What medications?" Does not have an inhaler   If inhaler, ask "How many puffs and how often?" Note: Review instructions on medication in the chart. -  OXYGEN: "Do you wear supplemental oxygen?" No If yes, "How many liters are you supposed to use?" -  "Do you monitor your oxygen levels?" No If yes, What is your reading (oxygen level) today? -  What is your usual oxygen saturation reading?  (Note: Pulmonary O2 sats should be 90% or greater) -    FYI Only or Action Required?: FYI only for provider: appointment scheduled on 04/10/2024.  Patient was last seen in primary care on unknown.  Called Nurse Triage reporting Cough.  Symptoms began several days ago.  Interventions attempted: OTC medications: Allergy meds.  Symptoms are: gradually worsening.  Triage Disposition: See PCP When Office is Open (Within 3 Days)  Patient/caregiver understands and will follow disposition?: Yes   Copied from CRM [id number]. Topic: Clinical - Red Word Triage >> Apr 06, 2024 10:35 AM Rozanna MATSU wrote: Red Word that prompted transfer to Nurse Triage: wheezing and shortness of breath, and some coughing Reason for Disposition  [1] Also has allergy symptoms (e.g., itchy eyes, clear nasal discharge, postnasal drip) AND [2] they are acting up  Answer Assessment - Initial Assessment Questions Patient  states she is unable to do her breathing treatments due to her machine being broken.    1. ONSET: When did the cough begin?      2-3 days ago  2. SEVERITY: How bad is the cough today?      Moderate  3. SPUTUM: Describe the color of your sputum (e.g., none, dry cough; clear, white, yellow, green)     Dry  4. HEMOPTYSIS: Are you coughing up any blood? If Yes, ask: How much? (e.g., flecks, streaks, tablespoons, etc.)     Denies  5. DIFFICULTY BREATHING: Are you having difficulty breathing? If Yes, ask: How bad is it? (e.g., mild, moderate, severe)      Moderate  6. FEVER: Do you have a fever? If Yes, ask: What is your temperature, how was it measured, and when did it start?     Denies  7. OTHER SYMPTOMS: Do you have any other symptoms? (e.g., runny nose, wheezing, chest pain)       Wheezing, dizziness  Protocols used: Cough - Acute Non-Productive-A-AH

## 2024-04-10 ENCOUNTER — Ambulatory Visit (HOSPITAL_BASED_OUTPATIENT_CLINIC_OR_DEPARTMENT_OTHER)

## 2024-04-10 ENCOUNTER — Ambulatory Visit (HOSPITAL_BASED_OUTPATIENT_CLINIC_OR_DEPARTMENT_OTHER): Payer: Self-pay

## 2024-04-10 NOTE — Telephone Encounter (Signed)
 FYI Only or Action Required?: FYI only for provider: appointment scheduled on 11/11.  Patient is followed in Pulmonology for asthma, last seen on 06/02/2022 by Shellia Oh, MD.  Called Nurse Triage reporting Shortness of Breath.  Symptoms began a week ago.  Symptoms are: unchanged.  Triage Disposition: See HCP Within 4 Hours (Or PCP Triage)  Patient/caregiver understands and will follow disposition?: Yes     Copied from CRM #[id number]. Topic: Clinical - Red Word Triage >> Apr 10, 2024 10:05 AM Patricia Ramsey wrote: Red Word that prompted transfer to Nurse Triage: Patient [phone] states [facility] and missed appointment 04/10/24 at 10 am with Patricia Ramsey. Patient states still having shortness of breath, wheezing and coughing and would like to reschedule. Please advise.     E2C2 Pulmonary Triage - Initial Assessment Questions "Chief Complaint (e.g., cough, sob, wheezing, fever, chills, sweat or additional symptoms) *Go to specific symptom protocol after initial questions. Shortness of breath and cough   "How long have symptoms been present?" 1 week  Have you tested for COVID or Flu? Note: If not, ask patient if a home test can be taken. If so, instruct patient to call back for positive results. No  MEDICINES:   "Have you used any OTC meds to help with symptoms?" Yes If yes, ask "What medications?" Allergy medication  "Have you used your inhalers/maintenance medication?" No If yes, "What medications?" Does not have an inhaler   If inhaler, ask "How many puffs and how often?" Note: Review instructions on medication in the chart. N/A  OXYGEN: "Do you wear supplemental oxygen?" No If yes, "How many liters are you supposed to use?" -  "Do you monitor your oxygen levels?" No If yes, What is your reading (oxygen level) today? -  What is your usual oxygen saturation reading?  (Note: Pulmonary O2 sats should be 90% or  greater) -     Reason for Disposition  [1] Longstanding difficulty breathing (e.g., CHF, COPD, emphysema) AND [2] WORSE than normal    Patient in no acute distress, symptoms present for a week, appointment scheduled for tomorrow  Answer Assessment - Initial Assessment Questions 1. RESPIRATORY STATUS: Describe your breathing? (e.g., wheezing, shortness of breath, unable to speak, severe coughing)      Shortness of breath  2. ONSET: When did this breathing problem begin?      About a week 3. PATTERN Does the difficult breathing come and go, or has it been constant since it started?      Constant  4. SEVERITY: How bad is your breathing? (e.g., mild, moderate, severe)      Moderate  5. RECURRENT SYMPTOM: Have you had difficulty breathing before? If Yes, ask: When was the last time? and What happened that time?      Yes 6. CARDIAC HISTORY: Do you have any history of heart disease? (e.g., heart attack, angina, bypass surgery, angioplasty)      Yes 7. LUNG HISTORY: Do you have any history of lung disease?  (e.g., pulmonary embolus, asthma, emphysema)     Yes 8. CAUSE: What do you think is causing the breathing problem?      History of asthma  9. OTHER SYMPTOMS: Do you have any other symptoms? (e.g., chest pain, cough, dizziness, fever, runny nose)     Cough, some dizziness, intermittent wheezing  10. O2 SATURATION MONITOR:  Do you use an oxygen saturation monitor (pulse oximeter) at home? If Yes, ask: What is your reading (oxygen  level) today? What is your usual oxygen saturation reading? (e.g., 95%)       Does not have a machine  Protocols used: Breathing Difficulty-A-AH

## 2024-04-11 ENCOUNTER — Encounter (HOSPITAL_BASED_OUTPATIENT_CLINIC_OR_DEPARTMENT_OTHER): Payer: Self-pay

## 2024-04-11 ENCOUNTER — Ambulatory Visit (INDEPENDENT_AMBULATORY_CARE_PROVIDER_SITE_OTHER)

## 2024-04-11 VITALS — BP 130/83 | HR 73 | Ht 63.0 in | Wt 247.0 lb

## 2024-04-11 DIAGNOSIS — J45991 Cough variant asthma: Secondary | ICD-10-CM | POA: Diagnosis not present

## 2024-04-11 DIAGNOSIS — J45901 Unspecified asthma with (acute) exacerbation: Secondary | ICD-10-CM

## 2024-04-11 MED ORDER — PREDNISONE 10 MG PO TABS: ORAL_TABLET | ORAL | 0 refills | Status: AC

## 2024-04-11 MED ORDER — ALBUTEROL SULFATE HFA 108 (90 BASE) MCG/ACT IN AERS: 2.0000 | INHALATION_SPRAY | Freq: Four times a day (QID) | RESPIRATORY_TRACT | 6 refills | Status: AC | PRN

## 2024-04-11 MED ORDER — ALBUTEROL SULFATE (2.5 MG/3ML) 0.083% IN NEBU: 2.5000 mg | INHALATION_SOLUTION | Freq: Four times a day (QID) | RESPIRATORY_TRACT | 12 refills | Status: AC | PRN

## 2024-04-11 MED ORDER — BUDESONIDE-FORMOTEROL FUMARATE 80-4.5 MCG/ACT IN AERO: 2.0000 | INHALATION_SPRAY | Freq: Every day | RESPIRATORY_TRACT | 12 refills | Status: AC

## 2024-04-11 MED ORDER — MONTELUKAST SODIUM 10 MG PO TABS: 10.0000 mg | ORAL_TABLET | Freq: Every day | ORAL | 5 refills | Status: AC

## 2024-04-11 MED ORDER — AZITHROMYCIN 250 MG PO TABS: 250.0000 mg | ORAL_TABLET | Freq: Every day | ORAL | 0 refills | Status: AC

## 2024-04-11 MED ORDER — BENZONATATE 200 MG PO CAPS: 200.0000 mg | ORAL_CAPSULE | Freq: Three times a day (TID) | ORAL | 1 refills | Status: AC | PRN

## 2024-04-11 MED ORDER — CETIRIZINE HCL 10 MG PO TABS: 10.0000 mg | ORAL_TABLET | Freq: Every day | ORAL | 5 refills | Status: AC

## 2024-04-11 NOTE — Progress Notes (Signed)
 @Patient  ID: Patricia Ramsey, female    DOB: 22-Nov-1965, 58 y.o.   MRN: [id number]  Chief Complaint  Patient presents with   Shortness of Breath    Acute  Visit     Referring provider: Benjamine Aland, MD  HPI: Discussed the use of AI scribe software for clinical note transcription with the patient, who gave verbal consent to proceed.  History of Present Illness Patricia Ramsey is a 58 year old female with asthma who presents with shortness of breath.  She has been experiencing shortness of breath for several weeks, which typically worsens during this time of year. Her symptoms include wheezing and chest tightness, and she reports getting tight and short of breath when trying to do things. She also has a cough producing clear sputum. No fever or chills are present.  She has not been using her prescribed medications, including albuterol  for nebulization, Dulera  inhaler, Singulair  10 mg, and Zyrtec, due to running out and not having visited a doctor recently. Her nebulizer machine is broken, preventing her from taking her usual breathing treatments. She has been without these medications for more than a couple of weeks. She also uses Flonase and an eye drop for allergies.  She has a history of sleep apnea. She also uses a humidifier to help with her symptoms. Recently, she has experienced a scratchy throat but no significant sore throat.  She mentions that taking care of her mother has impacted her ability to manage her own health needs.  Last OV 06/02/2022: Patricia Ramsey is a 58 y.o. female with obstructive sleep apnea and cough variant asthma.   Acute Visit      Pt states she is wheezing and productive cough since x 4 days. Pt states she went to Urgent Care last Friday and was treated for the Flu with Tamiflu . Pt states she tested negative for the flu. SOB with exertion.      TEST/EVENTS :   Pulmonary testing:  Spirometry 11/19/15 >> FEV1 1.74 (78%), FEV1% 79 IgE  12/28/19 >> 55 PFT 01/18/20 >> FEV1 1.78 (83%), FEV1% 82, TLC 4.33 (88%), DLCO 104%   Chest Imaging:  CT angio chest 10/09/14 >> PE with RV:LV ratio 2 CT angio chest 03/01/15 >> PE resolved   Sleep Tests:  PSG 11/22/15 >> AHI 42.6, SpO2 low 52% CPAP 04/09/21 to 07/07/20 >> used on 89 of 90 nights with average 7 hrs 59 min.  Average AHI 0.6 with CPAP 14 cm H2O   Cardiac Tests:  Doppler legs 10/09/14 >> Rt popliteal/peroneal vein DVT Echo 02/28/15 >> EF 60 to 65% Not on File  Immunization History  Administered Date(s) Administered   Influenza,inj,Quad PF,6+ Mos 02/22/2015, 04/07/2016, 05/17/2017, 04/01/2018, 04/21/2019, 03/19/2020   PFIZER(Purple Top)SARS-COV-2 Vaccination 09/14/2019, 10/05/2019    Past Medical History:  Diagnosis Date   Asthma    Diabetes (HCC)    boarderline, pt stopped Glucophage   GERD (gastroesophageal reflux disease)    Hypertension    no meds   Pulmonary embolism (HCC)    Sleep apnea     Tobacco History: Social History   Tobacco Use  Smoking Status Never  Smokeless Tobacco Never   Counseling given: Not Answered   Outpatient Medications Prior to Visit  Medication Sig Dispense Refill   ALPRAZolam (XANAX) 0.5 MG tablet Take 0.5 mg by mouth at bedtime.  0   amLODipine (NORVASC) 5 MG tablet Take 5 mg by mouth daily.   0   apixaban  (ELIQUIS ) 5  MG TABS tablet Take 1 tablet (5 mg total) by mouth 2 (two) times daily. 60 tablet 1   azelastine (ASTELIN) 0.1 % nasal spray Place 1 spray into both nostrils 2 (two) times daily. Use in each nostril as directed 30 mL 1   citalopram (CELEXA) 20 MG tablet Take 20 mg by mouth daily.     famotidine  (PEPCID ) 20 MG tablet Take 1 tablet (20 mg total) by mouth at bedtime. 30 tablet 5   FLUoxetine (PROZAC) 20 MG capsule Take 20 mg by mouth daily.  3   gabapentin  (NEURONTIN ) 300 MG capsule Take 1 capsule (300 mg total) by mouth 3 (three) times daily. 30 capsule 0   hydrochlorothiazide (HYDRODIURIL) 25 MG tablet Take 25 mg by  mouth daily.  0   HYDROcodone -acetaminophen  (NORCO) 5-325 MG tablet Take 1-2 tablets by mouth every 4 (four) hours as needed for moderate pain. 30 tablet 0   meclizine  (ANTIVERT ) 25 MG tablet Take 1 tablet (25 mg total) by mouth 3 (three) times daily as needed for dizziness. 30 tablet 0   Misc. Devices MISC by Does not apply route at bedtime. CPAP machine     mometasone  (NASONEX ) 50 MCG/ACT nasal spray Place 2 sprays into the nose daily as needed. 17 g 5   ondansetron  (ZOFRAN -ODT) 8 MG disintegrating tablet Take 1 tablet (8 mg total) by mouth every 8 (eight) hours as needed for nausea or vomiting. 10 tablet 0   OZEMPIC , 1 MG/DOSE, 4 MG/3ML SOPN INJECT 1 MG INTO THE SKIN EVERY WEEK     pantoprazole  (PROTONIX ) 40 MG tablet take 1 tablet by mouth once daily 30 TO 60 MINUTES BEFORE THE FIRST MEAL OF THE DAY 30 tablet 5   phenazopyridine (PYRIDIUM) 200 MG tablet Take 1 tablet (200 mg total) by mouth 3 (three) times daily. 6 tablet 0   rosuvastatin (CRESTOR) 20 MG tablet TK 1 T PO QD  2   Semaglutide , 2 MG/DOSE, (OZEMPIC , 2 MG/DOSE,) 8 MG/3ML SOPN Inject 2 mg into the skin once a week. 6 mL 0   tiZANidine  (ZANAFLEX ) 2 MG tablet Take 1 tablet (2 mg total) by mouth every 8 (eight) hours as needed for muscle spasms. 21 tablet 0   valACYclovir (VALTREX) 1000 MG tablet Take 1,000 mg by mouth 3 (three) times daily.     VRAYLAR 4.5 MG CAPS Take 1 capsule by mouth daily.     albuterol  (PROVENTIL ) (2.5 MG/3ML) 0.083% nebulizer solution Take 3 mLs (2.5 mg total) by nebulization every 6 (six) hours as needed for wheezing or shortness of breath. 75 mL 12   azithromycin  (ZITHROMAX ) 250 MG tablet Take 1 tablet (250 mg total) by mouth daily. Take first 2 tablets together, then 1 every day until finished. 6 tablet 0   mometasone -formoterol  (DULERA ) 100-5 MCG/ACT AERO Inhale 2 puffs into the lungs 2 (two) times daily. INHALE 2 PUFFS BY MOUTH FIRST THING IN THE AM AND THEN ANOTHER 2 PUFFS ABOUT 12 HOURS LATER 13 g 5    montelukast  (SINGULAIR ) 10 MG tablet TAKE 1 TABLET BY MOUTH AT BEDTIME 30 tablet 5   No facility-administered medications prior to visit.     Review of Systems: as per HPI  Constitutional:   No  weight loss, night sweats,  Fevers, chills, fatigue, or  lassitude.  HEENT:   No headaches,  Difficulty swallowing,  Tooth/dental problems, or  Sore throat,                No sneezing, itching,  ear ache, nasal congestion, post nasal drip,   CV:  No chest pain,  Orthopnea, PND, swelling in lower extremities, anasarca, dizziness, palpitations, syncope.   GI  No heartburn, indigestion, abdominal pain, nausea, vomiting, diarrhea, change in bowel habits, loss of appetite, bloody stools.   Resp: No shortness of breath with exertion or at rest.  No excess mucus, no productive cough,  No non-productive cough,  No coughing up of blood.  No change in color of mucus.  No wheezing.  No chest wall deformity  Skin: no rash or lesions.  GU: no dysuria, change in color of urine, no urgency or frequency.  No flank pain, no hematuria   MS:  No joint pain or swelling.  No decreased range of motion.  No back pain.    Physical Exam  BP 130/83   Pulse 73   Ht 5' 3 (1.6 m)   Wt 247 lb (112 kg)   SpO2 97%   BMI 43.75 kg/m   GEN: A/Ox3; pleasant , NAD, well nourished    HEENT:  [place]/AT,  EACs-clear, TMs-wnl, NOSE-clear, THROAT-clear, no lesions, no postnasal drip or exudate noted.   NECK:  Supple w/ fair ROM; no JVD; normal carotid impulses w/o bruits; no thyromegaly or nodules palpated; no lymphadenopathy.    RESP  tight exp wheezes  P & A; w/o, rales/ or rhonchi. no accessory muscle use, no dullness to percussion  CARD:  RRR, no m/r/g, no peripheral edema, pulses intact, no cyanosis or clubbing.  GI:   obese, soft & nt; nml bowel sounds; no organomegaly or masses detected.   Musco: Warm bil, no deformities or joint swelling noted.   Neuro: alert, no focal deficits noted.    Skin: Warm, no lesions  or rashes    Lab Results:  CBC    Component Value Date/Time   WBC 5.9 01/07/2024 1402   RBC 4.38 01/07/2024 1402   HGB 12.8 01/07/2024 1402   HCT 39.0 01/07/2024 1402   PLT 236 01/07/2024 1402   MCV 89.0 01/07/2024 1402   MCH 29.2 01/07/2024 1402   MCHC 32.8 01/07/2024 1402   RDW 14.6 01/07/2024 1402   LYMPHSABS 2.1 01/18/2020 1357   MONOABS 0.7 01/18/2020 1357   EOSABS 0.3 01/18/2020 1357   BASOSABS 0.0 01/18/2020 1357    BMET    Component Value Date/Time   NA 140 01/07/2024 1402   K 3.9 01/07/2024 1402   CL 104 01/07/2024 1402   CO2 24 01/07/2024 1402   GLUCOSE 90 01/07/2024 1402   BUN 9 01/07/2024 1402   CREATININE 0.85 01/07/2024 1402   CALCIUM 9.5 01/07/2024 1402   GFRNONAA >60 01/07/2024 1402   GFRAA >60 05/22/2019 1633    BNP    Component Value Date/Time   BNP 104.4 (H) 10/09/2014 0641    ProBNP No results found for: PROBNP  Imaging: No results found.  Administration History     None          Latest Ref Rng & Units 01/18/2020   11:57 AM  PFT Results  FVC-Pre L 2.04   FVC-Predicted Pre % 76   FVC-Post L 2.17   FVC-Predicted Post % 81   Pre FEV1/FVC % % 78   Post FEV1/FCV % % 82   FEV1-Pre L 1.60   FEV1-Predicted Pre % 75   FEV1-Post L 1.78   DLCO uncorrected ml/min/mmHg 20.87   DLCO UNC% % 104   DLCO corrected ml/min/mmHg 21.34   DLCO COR %Predicted %  107   DLVA Predicted % 140   TLC L 4.33   TLC % Predicted % 88   RV % Predicted % 116     No results found for: NITRICOXIDE   Assessment & Plan:   Assessment & Plan Cough variant asthma vs UACS  Exacerbation of asthma, unspecified asthma severity, unspecified whether persistent  Assessment and Plan Assessment & Plan Cough variant asthma with acute exacerbation Acute exacerbation with dyspnea, wheezing, and chest tightness likely due to medication non-adherence and possible atypical infection. - Prescribed albuterol  nebulizer solution and puffer for acute relief. -  Prescribed Breyna  inhaler as a replacement for Dulera  due to insurance issues. - Prescribed Singulair  10 mg daily. - Prescribed Z-Pak (azithromycin ) for 5 days for potential atypical infection. - Prescribed a steroid taper to reduce inflammation. - Prescribed Zyrtec for allergy symptoms. - Ordered a new nebulizer machine. - Scheduled follow-up in 6 weeks to assess treatment response. - Plan to review compliance download at that time for OSA surveillance.    Return in about 6 weeks (around 05/23/2024) for compliance download.  Candis Dandy, PA-C 04/11/2024

## 2024-04-11 NOTE — Patient Instructions (Addendum)
 Start Prednisone  taper and Azithromycin .  Resume daily inhaler.  New rx sent for Breyna , Albuterol  MDI and nebs, nebulizer machine, Singulair  and Zyrtec.  Follow up in 6 weeks; review compliance download at that time.

## 2024-04-12 ENCOUNTER — Other Ambulatory Visit (HOSPITAL_COMMUNITY): Payer: Self-pay

## 2024-04-12 ENCOUNTER — Telehealth: Payer: Self-pay

## 2024-04-12 NOTE — Telephone Encounter (Signed)
*  Pulm  Pharmacy Patient Advocate Encounter   Received notification from Fax that prior authorization for Albuterol  0.083% nebulizer solution is required/requested.   Insurance verification completed.   The patient is insured through [employer].   Per test claim: Medication is not eligible for pharmacy benefits and must be billed through medical insurance. As our team only handles pharmacy related prior auths, medical PA's must be submitted by the clinic. Thank you

## 2024-04-13 ENCOUNTER — Other Ambulatory Visit (HOSPITAL_BASED_OUTPATIENT_CLINIC_OR_DEPARTMENT_OTHER): Payer: Self-pay

## 2024-04-13 DIAGNOSIS — J45901 Unspecified asthma with (acute) exacerbation: Secondary | ICD-10-CM

## 2024-04-13 DIAGNOSIS — J45991 Cough variant asthma: Secondary | ICD-10-CM

## 2024-04-13 NOTE — Telephone Encounter (Signed)
 Order placed to be filled at DME company

## 2024-04-25 ENCOUNTER — Telehealth (HOSPITAL_BASED_OUTPATIENT_CLINIC_OR_DEPARTMENT_OTHER): Payer: Self-pay | Admitting: *Deleted

## 2024-04-25 NOTE — Telephone Encounter (Signed)
 Drug change request from [facility]  signed by provider and faxed confirmation received

## 2024-05-22 ENCOUNTER — Ambulatory Visit (HOSPITAL_BASED_OUTPATIENT_CLINIC_OR_DEPARTMENT_OTHER)

## 2024-05-22 NOTE — Progress Notes (Deleted)
 "  @Patient  ID: Patricia Ramsey, female    DOB: [birth year], 58 y.o.   MRN: [id number]  No chief complaint on file.   Referring provider: Benjamine Aland, MD  HPI: Discussed the use of AI scribe software for clinical note transcription with the patient, who gave verbal consent to proceed.  History of Present Illness   Last OV 04/11/2024: Patricia Ramsey is a 58 year old female with asthma who presents with shortness of breath.   She has been experiencing shortness of breath for several weeks, which typically worsens during this time of year. Her symptoms include wheezing and chest tightness, and she reports getting tight and short of breath when trying to do things. She also has a cough producing clear sputum. No fever or chills are present.   She has not been using her prescribed medications, including albuterol  for nebulization, Dulera  inhaler, Singulair  10 mg, and Zyrtec , due to running out and not having visited a doctor recently. Her nebulizer machine is broken, preventing her from taking her usual breathing treatments. She has been without these medications for more than a couple of weeks. She also uses Flonase and an eye drop for allergies.   She has a history of sleep apnea. She also uses a humidifier to help with her symptoms. Recently, she has experienced a scratchy throat but no significant sore throat.   She mentions that taking care of her mother has   TEST/EVENTS :    Pulmonary testing:  Spirometry 11/19/15 >> FEV1 1.74 (78%), FEV1% 79 IgE 12/28/19 >> 55 PFT 01/18/20 >> FEV1 1.78 (83%), FEV1% 82, TLC 4.33 (88%), DLCO 104%   Chest Imaging:  CT angio chest 10/09/14 >> PE with RV:LV ratio 2 CT angio chest 03/01/15 >> PE resolved   Sleep Tests:  PSG 11/22/15 >> AHI 42.6, SpO2 low 52% CPAP 04/09/21 to 07/07/20 >> used on 89 of 90 nights with average 7 hrs 59 min.  Average AHI 0.6 with CPAP 14 cm H2O   Cardiac Tests:  Doppler legs 10/09/14 >> Rt popliteal/peroneal  vein DVT Echo 02/28/15 >> EF 60 to 65% Allergies     Allergies[1]  Immunization History  Administered Date(s) Administered   Influenza,inj,Quad PF,6+ Mos 02/22/2015, 04/07/2016, 05/17/2017, 04/01/2018, 04/21/2019, 03/19/2020   PFIZER(Purple Top)SARS-COV-2 Vaccination 09/14/2019, 10/05/2019    Past Medical History:  Diagnosis Date   Asthma    Diabetes (HCC)    boarderline, pt stopped Glucophage   GERD (gastroesophageal reflux disease)    Hypertension    no meds   Pulmonary embolism (HCC)    Sleep apnea     Tobacco History: Tobacco Use History[2] Counseling given: Not Answered   Outpatient Medications Prior to Visit  Medication Sig Dispense Refill   albuterol  (PROVENTIL ) (2.5 MG/3ML) 0.083% nebulizer solution Take 3 mLs (2.5 mg total) by nebulization every 6 (six) hours as needed for wheezing or shortness of breath. 75 mL 12   albuterol  (VENTOLIN  HFA) 108 (90 Base) MCG/ACT inhaler Inhale 2 puffs into the lungs every 6 (six) hours as needed for wheezing or shortness of breath. 8 g 6   ALPRAZolam (XANAX) 0.5 MG tablet Take 0.5 mg by mouth at bedtime.  0   amLODipine (NORVASC) 5 MG tablet Take 5 mg by mouth daily.   0   apixaban  (ELIQUIS ) 5 MG TABS tablet Take 1 tablet (5 mg total) by mouth 2 (two) times daily. 60 tablet 1   azelastine  (ASTELIN ) 0.1 % nasal spray Place 1 spray into both  nostrils 2 (two) times daily. Use in each nostril as directed 30 mL 1   azithromycin  (ZITHROMAX ) 250 MG tablet Take 1 tablet (250 mg total) by mouth daily. Take first 2 tablets together, then 1 every day until finished. 6 tablet 0   benzonatate  (TESSALON ) 200 MG capsule Take 1 capsule (200 mg total) by mouth 3 (three) times daily as needed for cough. 30 capsule 1   budesonide -formoterol  (BREYNA ) 80-4.5 MCG/ACT inhaler Inhale 2 puffs into the lungs daily. 1 each 12   cetirizine  (ZYRTEC ) 10 MG tablet Take 1 tablet (10 mg total) by mouth daily. 30 tablet 5   citalopram (CELEXA) 20 MG tablet Take 20  mg by mouth daily.     famotidine  (PEPCID ) 20 MG tablet Take 1 tablet (20 mg total) by mouth at bedtime. 30 tablet 5   FLUoxetine (PROZAC) 20 MG capsule Take 20 mg by mouth daily.  3   gabapentin  (NEURONTIN ) 300 MG capsule Take 1 capsule (300 mg total) by mouth 3 (three) times daily. 30 capsule 0   hydrochlorothiazide (HYDRODIURIL) 25 MG tablet Take 25 mg by mouth daily.  0   HYDROcodone -acetaminophen  (NORCO) 5-325 MG tablet Take 1-2 tablets by mouth every 4 (four) hours as needed for moderate pain. 30 tablet 0   meclizine  (ANTIVERT ) 25 MG tablet Take 1 tablet (25 mg total) by mouth 3 (three) times daily as needed for dizziness. 30 tablet 0   Misc. Devices MISC by Does not apply route at bedtime. CPAP machine     mometasone  (NASONEX ) 50 MCG/ACT nasal spray Place 2 sprays into the nose daily as needed. 17 g 5   montelukast  (SINGULAIR ) 10 MG tablet Take 1 tablet (10 mg total) by mouth at bedtime. 30 tablet 5   ondansetron  (ZOFRAN -ODT) 8 MG disintegrating tablet Take 1 tablet (8 mg total) by mouth every 8 (eight) hours as needed for nausea or vomiting. 10 tablet 0   OZEMPIC , 1 MG/DOSE, 4 MG/3ML SOPN INJECT 1 MG INTO THE SKIN EVERY WEEK     pantoprazole  (PROTONIX ) 40 MG tablet take 1 tablet by mouth once daily 30 TO 60 MINUTES BEFORE THE FIRST MEAL OF THE DAY 30 tablet 5   phenazopyridine  (PYRIDIUM ) 200 MG tablet Take 1 tablet (200 mg total) by mouth 3 (three) times daily. 6 tablet 0   rosuvastatin (CRESTOR) 20 MG tablet TK 1 T PO QD  2   Semaglutide , 2 MG/DOSE, (OZEMPIC , 2 MG/DOSE,) 8 MG/3ML SOPN Inject 2 mg into the skin once a week. 6 mL 0   tiZANidine  (ZANAFLEX ) 2 MG tablet Take 1 tablet (2 mg total) by mouth every 8 (eight) hours as needed for muscle spasms. 21 tablet 0   valACYclovir (VALTREX) 1000 MG tablet Take 1,000 mg by mouth 3 (three) times daily.     VRAYLAR 4.5 MG CAPS Take 1 capsule by mouth daily.     No facility-administered medications prior to visit.     Review of Systems:    Constitutional:   No  weight loss, night sweats,  Fevers, chills, fatigue, or  lassitude.  HEENT:   No headaches,  Difficulty swallowing,  Tooth/dental problems, or  Sore throat,                No sneezing, itching, ear ache, nasal congestion, post nasal drip,   CV:  No chest pain,  Orthopnea, PND, swelling in lower extremities, anasarca, dizziness, palpitations, syncope.   GI  No heartburn, indigestion, abdominal pain, nausea, vomiting, diarrhea, change in  bowel habits, loss of appetite, bloody stools.   Resp: No shortness of breath with exertion or at rest.  No excess mucus, no productive cough,  No non-productive cough,  No coughing up of blood.  No change in color of mucus.  No wheezing.  No chest wall deformity  Skin: no rash or lesions.  GU: no dysuria, change in color of urine, no urgency or frequency.  No flank pain, no hematuria   MS:  No joint pain or swelling.  No decreased range of motion.  No back pain.    Physical Exam  There were no vitals taken for this visit.  GEN: A/Ox3; pleasant , NAD, well nourished    HEENT:  [place]/AT,  EACs-clear, TMs-wnl, NOSE-clear, THROAT-clear, no lesions, no postnasal drip or exudate noted.   NECK:  Supple w/ fair ROM; no JVD; normal carotid impulses w/o bruits; no thyromegaly or nodules palpated; no lymphadenopathy.    RESP  Clear  P & A; w/o, wheezes/ rales/ or rhonchi. no accessory muscle use, no dullness to percussion  CARD:  RRR, no m/r/g, no peripheral edema, pulses intact, no cyanosis or clubbing.  GI:   Soft & nt; nml bowel sounds; no organomegaly or masses detected.   Musco: Warm bil, no deformities or joint swelling noted.   Neuro: alert, no focal deficits noted.    Skin: Warm, no lesions or rashes    Lab Results:  CBC    Component Value Date/Time   WBC 5.9 01/07/2024 1402   RBC 4.38 01/07/2024 1402   HGB 12.8 01/07/2024 1402   HCT 39.0 01/07/2024 1402   PLT 236 01/07/2024 1402   MCV 89.0 01/07/2024 1402    MCH 29.2 01/07/2024 1402   MCHC 32.8 01/07/2024 1402   RDW 14.6 01/07/2024 1402   LYMPHSABS 2.1 01/18/2020 1357   MONOABS 0.7 01/18/2020 1357   EOSABS 0.3 01/18/2020 1357   BASOSABS 0.0 01/18/2020 1357    BMET    Component Value Date/Time   NA 140 01/07/2024 1402   K 3.9 01/07/2024 1402   CL 104 01/07/2024 1402   CO2 24 01/07/2024 1402   GLUCOSE 90 01/07/2024 1402   BUN 9 01/07/2024 1402   CREATININE 0.85 01/07/2024 1402   CALCIUM 9.5 01/07/2024 1402   GFRNONAA >60 01/07/2024 1402   GFRAA >60 05/22/2019 1633    BNP    Component Value Date/Time   BNP 104.4 (H) 10/09/2014 0641    ProBNP No results found for: PROBNP  Imaging: No results found.  Administration History     None          Latest Ref Rng & Units 01/18/2020   11:57 AM  PFT Results  FVC-Pre L 2.04   FVC-Predicted Pre % 76   FVC-Post L 2.17   FVC-Predicted Post % 81   Pre FEV1/FVC % % 78   Post FEV1/FCV % % 82   FEV1-Pre L 1.60   FEV1-Predicted Pre % 75   FEV1-Post L 1.78   DLCO uncorrected ml/min/mmHg 20.87   DLCO UNC% % 104   DLCO corrected ml/min/mmHg 21.34   DLCO COR %Predicted % 107   DLVA Predicted % 140   TLC L 4.33   TLC % Predicted % 88   RV % Predicted % 116     No results found for: NITRICOXIDE   Assessment & Plan:   Assessment & Plan Cough variant asthma  OSA (obstructive sleep apnea)    No follow-ups on file.  Candis Dandy,  PA-C 05/22/2024      [1] Not on File [2]  Social History Tobacco Use  Smoking Status Never  Smokeless Tobacco Never   "
